# Patient Record
Sex: Male | Born: 1940
Health system: Southern US, Community
[De-identification: ages and names within clinical notes are randomized; demographics above are authoritative.]

## PROBLEM LIST (undated history)

## (undated) DIAGNOSIS — R410 Disorientation, unspecified: Secondary | ICD-10-CM

## (undated) DIAGNOSIS — N281 Cyst of kidney, acquired: Secondary | ICD-10-CM

## (undated) DIAGNOSIS — C801 Malignant (primary) neoplasm, unspecified: Secondary | ICD-10-CM

## (undated) DIAGNOSIS — I10 Essential (primary) hypertension: Secondary | ICD-10-CM

## (undated) DIAGNOSIS — J449 Chronic obstructive pulmonary disease, unspecified: Secondary | ICD-10-CM

## (undated) DIAGNOSIS — R972 Elevated prostate specific antigen [PSA]: Secondary | ICD-10-CM

## (undated) DIAGNOSIS — D329 Benign neoplasm of meninges, unspecified: Secondary | ICD-10-CM

## (undated) DIAGNOSIS — I4891 Unspecified atrial fibrillation: Secondary | ICD-10-CM

## (undated) DIAGNOSIS — E785 Hyperlipidemia, unspecified: Secondary | ICD-10-CM

## (undated) DIAGNOSIS — R0781 Pleurodynia: Secondary | ICD-10-CM

## (undated) DIAGNOSIS — N189 Chronic kidney disease, unspecified: Secondary | ICD-10-CM

## (undated) DIAGNOSIS — C649 Malignant neoplasm of unspecified kidney, except renal pelvis: Secondary | ICD-10-CM

## (undated) DIAGNOSIS — R001 Bradycardia, unspecified: Secondary | ICD-10-CM

## (undated) DIAGNOSIS — G9340 Encephalopathy, unspecified: Secondary | ICD-10-CM

## (undated) DIAGNOSIS — N400 Enlarged prostate without lower urinary tract symptoms: Secondary | ICD-10-CM

## (undated) DIAGNOSIS — N200 Calculus of kidney: Secondary | ICD-10-CM

## (undated) DIAGNOSIS — F0390 Unspecified dementia without behavioral disturbance: Secondary | ICD-10-CM

## (undated) DIAGNOSIS — N401 Enlarged prostate with lower urinary tract symptoms: Secondary | ICD-10-CM

## (undated) DIAGNOSIS — R259 Unspecified abnormal involuntary movements: Secondary | ICD-10-CM

## (undated) DIAGNOSIS — M5136 Other intervertebral disc degeneration, lumbar region: Secondary | ICD-10-CM

## (undated) HISTORY — DX: Elevated prostate specific antigen (PSA): R97.20

## (undated) HISTORY — DX: Benign neoplasm of meninges, unspecified: D32.9

## (undated) HISTORY — PX: CYSTOSCOPY WITH FULGERATION: SHX6638

## (undated) HISTORY — DX: Other intervertebral disc degeneration, lumbar region: M51.36

## (undated) HISTORY — DX: Unspecified abnormal involuntary movements: R25.9

## (undated) HISTORY — DX: Encephalopathy, unspecified: G93.40

## (undated) HISTORY — PX: URETEROSCOPY WITH HOLMIUM LASER LITHOTRIPSY: SHX6645

## (undated) HISTORY — DX: Unspecified atrial fibrillation: I48.91

## (undated) HISTORY — DX: Cyst of kidney, acquired: N28.1

## (undated) HISTORY — DX: Pleurodynia: R07.81

## (undated) HISTORY — DX: Calculus of kidney: N20.0

## (undated) HISTORY — DX: Benign prostatic hyperplasia with lower urinary tract symptoms: N40.1

## (undated) HISTORY — PX: NEPHRECTOMY: SHX65

## (undated) HISTORY — DX: Malignant neoplasm of unspecified kidney, except renal pelvis: C64.9

## (undated) HISTORY — DX: Essential (primary) hypertension: I10

## (undated) HISTORY — DX: Chronic obstructive pulmonary disease, unspecified: J44.9

## (undated) HISTORY — DX: Hyperlipidemia, unspecified: E78.5

## (undated) HISTORY — PX: COLON SURGERY: SHX602

## (undated) HISTORY — DX: Bradycardia, unspecified: R00.1

---

## 2004-05-31 ENCOUNTER — Ambulatory Visit: Payer: Self-pay | Admitting: Pain Medicine

## 2004-07-05 ENCOUNTER — Ambulatory Visit: Payer: Self-pay | Admitting: Pain Medicine

## 2004-07-26 ENCOUNTER — Ambulatory Visit: Payer: Self-pay | Admitting: Physician Assistant

## 2004-10-03 ENCOUNTER — Ambulatory Visit: Payer: Self-pay | Admitting: Physician Assistant

## 2004-12-26 ENCOUNTER — Ambulatory Visit: Payer: Self-pay | Admitting: Urology

## 2005-04-01 ENCOUNTER — Ambulatory Visit: Payer: Self-pay | Admitting: Physician Assistant

## 2005-07-30 ENCOUNTER — Ambulatory Visit: Payer: Self-pay | Admitting: Physician Assistant

## 2005-09-16 ENCOUNTER — Ambulatory Visit: Payer: Self-pay | Admitting: Pain Medicine

## 2005-10-14 ENCOUNTER — Ambulatory Visit: Payer: Self-pay | Admitting: Gastroenterology

## 2005-11-04 ENCOUNTER — Ambulatory Visit: Payer: Self-pay | Admitting: Physician Assistant

## 2005-11-21 ENCOUNTER — Ambulatory Visit: Payer: Self-pay | Admitting: Urology

## 2006-01-06 ENCOUNTER — Ambulatory Visit: Payer: Self-pay | Admitting: Urology

## 2006-03-04 ENCOUNTER — Ambulatory Visit: Payer: Self-pay | Admitting: Physician Assistant

## 2006-05-29 ENCOUNTER — Ambulatory Visit: Payer: Self-pay | Admitting: Physician Assistant

## 2006-06-25 ENCOUNTER — Ambulatory Visit: Payer: Self-pay | Admitting: Physician Assistant

## 2006-06-30 ENCOUNTER — Ambulatory Visit: Payer: Self-pay | Admitting: Urology

## 2006-08-05 ENCOUNTER — Ambulatory Visit: Payer: Self-pay | Admitting: Pain Medicine

## 2006-08-19 ENCOUNTER — Ambulatory Visit: Payer: Self-pay | Admitting: Physician Assistant

## 2006-10-01 ENCOUNTER — Ambulatory Visit: Payer: Self-pay | Admitting: Physician Assistant

## 2006-11-26 ENCOUNTER — Ambulatory Visit: Payer: Self-pay | Admitting: Physician Assistant

## 2007-02-24 ENCOUNTER — Ambulatory Visit: Payer: Self-pay | Admitting: Physician Assistant

## 2007-03-24 ENCOUNTER — Ambulatory Visit: Payer: Self-pay | Admitting: Physician Assistant

## 2007-03-31 ENCOUNTER — Ambulatory Visit: Payer: Self-pay | Admitting: Pain Medicine

## 2007-04-22 ENCOUNTER — Ambulatory Visit: Payer: Self-pay | Admitting: Physician Assistant

## 2007-07-13 ENCOUNTER — Emergency Department: Payer: Self-pay | Admitting: Emergency Medicine

## 2007-07-23 ENCOUNTER — Ambulatory Visit: Payer: Self-pay | Admitting: Physician Assistant

## 2007-08-05 ENCOUNTER — Ambulatory Visit: Payer: Self-pay

## 2007-08-24 ENCOUNTER — Ambulatory Visit: Payer: Self-pay | Admitting: Physician Assistant

## 2007-09-22 ENCOUNTER — Ambulatory Visit: Payer: Self-pay | Admitting: Physician Assistant

## 2007-11-19 ENCOUNTER — Ambulatory Visit: Payer: Self-pay | Admitting: Physician Assistant

## 2008-02-07 ENCOUNTER — Emergency Department: Payer: Self-pay | Admitting: Emergency Medicine

## 2008-02-08 ENCOUNTER — Ambulatory Visit: Payer: Self-pay | Admitting: Urology

## 2008-02-15 ENCOUNTER — Ambulatory Visit: Payer: Self-pay | Admitting: Urology

## 2008-02-17 ENCOUNTER — Other Ambulatory Visit: Payer: Self-pay

## 2008-02-17 ENCOUNTER — Ambulatory Visit: Payer: Self-pay | Admitting: Physician Assistant

## 2008-02-17 ENCOUNTER — Ambulatory Visit: Payer: Self-pay | Admitting: Urology

## 2008-02-18 ENCOUNTER — Ambulatory Visit: Payer: Self-pay | Admitting: Urology

## 2008-02-23 ENCOUNTER — Ambulatory Visit: Payer: Self-pay | Admitting: Physician Assistant

## 2008-03-01 ENCOUNTER — Ambulatory Visit: Payer: Self-pay | Admitting: Urology

## 2008-03-03 ENCOUNTER — Ambulatory Visit: Payer: Self-pay | Admitting: Urology

## 2008-03-09 ENCOUNTER — Ambulatory Visit: Payer: Self-pay | Admitting: Urology

## 2008-03-17 ENCOUNTER — Ambulatory Visit: Payer: Self-pay | Admitting: Urology

## 2008-03-30 ENCOUNTER — Ambulatory Visit: Payer: Self-pay | Admitting: Physician Assistant

## 2008-06-06 ENCOUNTER — Ambulatory Visit: Payer: Self-pay | Admitting: Physician Assistant

## 2008-06-16 ENCOUNTER — Ambulatory Visit: Payer: Self-pay | Admitting: Pain Medicine

## 2008-06-30 ENCOUNTER — Ambulatory Visit: Payer: Self-pay | Admitting: Physician Assistant

## 2008-08-03 ENCOUNTER — Ambulatory Visit: Payer: Self-pay | Admitting: Physician Assistant

## 2008-11-07 ENCOUNTER — Ambulatory Visit: Payer: Self-pay | Admitting: Pain Medicine

## 2008-11-15 ENCOUNTER — Ambulatory Visit: Payer: Self-pay | Admitting: Urology

## 2008-11-29 ENCOUNTER — Ambulatory Visit: Payer: Self-pay | Admitting: Urology

## 2008-11-30 ENCOUNTER — Inpatient Hospital Stay: Payer: Self-pay | Admitting: Urology

## 2008-12-13 ENCOUNTER — Emergency Department: Payer: Self-pay | Admitting: Urology

## 2008-12-21 ENCOUNTER — Ambulatory Visit: Payer: Self-pay | Admitting: Urology

## 2009-02-01 ENCOUNTER — Ambulatory Visit: Payer: Self-pay | Admitting: Physician Assistant

## 2009-05-02 ENCOUNTER — Ambulatory Visit: Payer: Self-pay | Admitting: Physician Assistant

## 2009-06-02 ENCOUNTER — Ambulatory Visit: Payer: Self-pay | Admitting: Urology

## 2009-08-01 ENCOUNTER — Ambulatory Visit: Payer: Self-pay | Admitting: Physician Assistant

## 2009-10-30 ENCOUNTER — Ambulatory Visit: Payer: Self-pay | Admitting: Pain Medicine

## 2009-12-26 ENCOUNTER — Ambulatory Visit: Payer: Self-pay | Admitting: Urology

## 2010-01-01 ENCOUNTER — Ambulatory Visit: Payer: Self-pay | Admitting: Urology

## 2010-01-10 ENCOUNTER — Ambulatory Visit: Payer: Self-pay | Admitting: Urology

## 2010-01-24 ENCOUNTER — Ambulatory Visit: Payer: Self-pay | Admitting: Urology

## 2010-04-17 ENCOUNTER — Ambulatory Visit: Payer: Self-pay | Admitting: Gastroenterology

## 2010-04-19 LAB — PATHOLOGY REPORT

## 2010-07-20 ENCOUNTER — Ambulatory Visit: Payer: Self-pay | Admitting: Urology

## 2010-07-31 ENCOUNTER — Ambulatory Visit: Payer: Self-pay | Admitting: Gastroenterology

## 2010-08-21 ENCOUNTER — Ambulatory Visit: Payer: Self-pay | Admitting: Surgery

## 2010-08-28 ENCOUNTER — Inpatient Hospital Stay: Payer: Self-pay | Admitting: Surgery

## 2010-08-30 LAB — PATHOLOGY REPORT

## 2011-02-12 ENCOUNTER — Ambulatory Visit: Payer: Self-pay | Admitting: Urology

## 2012-02-26 ENCOUNTER — Ambulatory Visit: Payer: Self-pay | Admitting: Urology

## 2012-10-06 ENCOUNTER — Ambulatory Visit: Payer: Self-pay | Admitting: Gastroenterology

## 2012-12-31 ENCOUNTER — Ambulatory Visit: Payer: Self-pay | Admitting: Urology

## 2013-01-01 ENCOUNTER — Emergency Department: Payer: Self-pay | Admitting: Emergency Medicine

## 2013-01-01 LAB — BASIC METABOLIC PANEL
Anion Gap: 4 — ABNORMAL LOW (ref 7–16)
Calcium, Total: 9 mg/dL (ref 8.5–10.1)
Chloride: 105 mmol/L (ref 98–107)
Co2: 28 mmol/L (ref 21–32)
Creatinine: 1.14 mg/dL (ref 0.60–1.30)
EGFR (Non-African Amer.): >60
Glucose: 107 mg/dL — ABNORMAL HIGH (ref 65–99)
Osmolality: 276 (ref 275–301)
Potassium: 3.9 mmol/L (ref 3.5–5.1)
Sodium: 137 mmol/L (ref 136–145)

## 2013-01-01 LAB — URINALYSIS, COMPLETE
Bilirubin,UR: NEGATIVE
Glucose,UR: NEGATIVE mg/dL (ref 0–75)
Ketone: NEGATIVE
Leukocyte Esterase: NEGATIVE
Nitrite: NEGATIVE
Ph: 7 (ref 4.5–8.0)
Protein: NEGATIVE
Squamous Epithelial: <1
WBC UR: 1 /HPF (ref 0–5)

## 2013-01-01 LAB — CBC
HCT: 43.4 % (ref 40.0–52.0)
HGB: 15 g/dL (ref 13.0–18.0)
MCH: 32 pg (ref 26.0–34.0)
MCHC: 34.6 g/dL (ref 32.0–36.0)
MCV: 92 fL (ref 80–100)
Platelet: 163 10*3/uL (ref 150–440)
RDW: 14 % (ref 11.5–14.5)
WBC: 11.1 10*3/uL — ABNORMAL HIGH (ref 3.8–10.6)

## 2013-01-04 ENCOUNTER — Ambulatory Visit: Payer: Self-pay | Admitting: Urology

## 2013-01-05 ENCOUNTER — Ambulatory Visit: Payer: Self-pay | Admitting: Urology

## 2013-01-06 ENCOUNTER — Ambulatory Visit: Payer: Self-pay | Admitting: Urology

## 2013-03-03 DIAGNOSIS — R339 Retention of urine, unspecified: Secondary | ICD-10-CM | POA: Insufficient documentation

## 2013-03-03 DIAGNOSIS — R351 Nocturia: Secondary | ICD-10-CM | POA: Insufficient documentation

## 2013-03-03 DIAGNOSIS — N401 Enlarged prostate with lower urinary tract symptoms: Secondary | ICD-10-CM

## 2013-03-03 DIAGNOSIS — R972 Elevated prostate specific antigen [PSA]: Secondary | ICD-10-CM

## 2013-03-03 HISTORY — DX: Benign prostatic hyperplasia with lower urinary tract symptoms: N40.1

## 2013-03-03 HISTORY — DX: Elevated prostate specific antigen (PSA): R97.20

## 2013-06-30 ENCOUNTER — Ambulatory Visit: Payer: Self-pay | Admitting: Ophthalmology

## 2013-10-20 ENCOUNTER — Ambulatory Visit: Payer: Self-pay | Admitting: Physical Medicine and Rehabilitation

## 2013-10-26 ENCOUNTER — Ambulatory Visit: Payer: Self-pay | Admitting: Oncology

## 2013-10-26 LAB — CBC CANCER CENTER
Basophil #: 0 x10 3/mm (ref 0.0–0.1)
Basophil %: 0.7 %
EOS ABS: 0.4 x10 3/mm (ref 0.0–0.7)
Eosinophil %: 5.7 %
HCT: 43.5 % (ref 40.0–52.0)
HGB: 14.3 g/dL (ref 13.0–18.0)
LYMPHS ABS: 1.6 x10 3/mm (ref 1.0–3.6)
Lymphocyte %: 24.9 %
MCH: 30.6 pg (ref 26.0–34.0)
MCHC: 32.8 g/dL (ref 32.0–36.0)
MCV: 93 fL (ref 80–100)
Monocyte #: 0.5 x10 3/mm (ref 0.2–1.0)
Monocyte %: 7.7 %
NEUTROS PCT: 61 %
Neutrophil #: 3.9 x10 3/mm (ref 1.4–6.5)
PLATELETS: 202 x10 3/mm (ref 150–440)
RBC: 4.67 10*6/uL (ref 4.40–5.90)
RDW: 13.9 % (ref 11.5–14.5)
WBC: 6.5 x10 3/mm (ref 3.8–10.6)

## 2013-10-26 LAB — COMPREHENSIVE METABOLIC PANEL
ALBUMIN: 4 g/dL (ref 3.4–5.0)
ANION GAP: 8 (ref 7–16)
Alkaline Phosphatase: 79 U/L
BUN: 13 mg/dL (ref 7–18)
Bilirubin,Total: 0.7 mg/dL (ref 0.2–1.0)
CHLORIDE: 103 mmol/L (ref 98–107)
Calcium, Total: 9.4 mg/dL (ref 8.5–10.1)
Co2: 30 mmol/L (ref 21–32)
Creatinine: 0.9 mg/dL (ref 0.60–1.30)
EGFR (Non-African Amer.): >60
Glucose: 137 mg/dL — ABNORMAL HIGH (ref 65–99)
OSMOLALITY: 284 (ref 275–301)
Potassium: 3.7 mmol/L (ref 3.5–5.1)
SGOT(AST): 8 U/L — ABNORMAL LOW (ref 15–37)
SGPT (ALT): 16 U/L (ref 12–78)
Sodium: 141 mmol/L (ref 136–145)
Total Protein: 7.3 g/dL (ref 6.4–8.2)

## 2013-10-27 LAB — PSA: PSA: 2 ng/mL (ref 0.0–4.0)

## 2013-11-12 ENCOUNTER — Ambulatory Visit: Payer: Self-pay | Admitting: Oncology

## 2013-11-17 DIAGNOSIS — N2889 Other specified disorders of kidney and ureter: Secondary | ICD-10-CM | POA: Insufficient documentation

## 2013-12-03 ENCOUNTER — Ambulatory Visit: Payer: Self-pay | Admitting: Gastroenterology

## 2013-12-06 DIAGNOSIS — N200 Calculus of kidney: Secondary | ICD-10-CM

## 2013-12-06 HISTORY — DX: Calculus of kidney: N20.0

## 2013-12-11 DIAGNOSIS — I4891 Unspecified atrial fibrillation: Secondary | ICD-10-CM

## 2013-12-11 DIAGNOSIS — R001 Bradycardia, unspecified: Secondary | ICD-10-CM

## 2013-12-11 DIAGNOSIS — J449 Chronic obstructive pulmonary disease, unspecified: Secondary | ICD-10-CM

## 2013-12-11 HISTORY — DX: Unspecified atrial fibrillation: I48.91

## 2013-12-11 HISTORY — DX: Chronic obstructive pulmonary disease, unspecified: J44.9

## 2013-12-11 HISTORY — DX: Bradycardia, unspecified: R00.1

## 2013-12-13 DIAGNOSIS — M5136 Other intervertebral disc degeneration, lumbar region: Secondary | ICD-10-CM

## 2013-12-13 DIAGNOSIS — M51369 Other intervertebral disc degeneration, lumbar region without mention of lumbar back pain or lower extremity pain: Secondary | ICD-10-CM

## 2013-12-13 HISTORY — DX: Other intervertebral disc degeneration, lumbar region without mention of lumbar back pain or lower extremity pain: M51.369

## 2013-12-13 HISTORY — DX: Other intervertebral disc degeneration, lumbar region: M51.36

## 2013-12-22 DIAGNOSIS — Z905 Acquired absence of kidney: Secondary | ICD-10-CM | POA: Insufficient documentation

## 2014-01-11 DIAGNOSIS — Z8719 Personal history of other diseases of the digestive system: Secondary | ICD-10-CM | POA: Insufficient documentation

## 2014-05-31 DIAGNOSIS — R0781 Pleurodynia: Secondary | ICD-10-CM

## 2014-05-31 HISTORY — DX: Pleurodynia: R07.81

## 2014-06-14 ENCOUNTER — Ambulatory Visit: Payer: Self-pay | Admitting: Ophthalmology

## 2014-06-14 DIAGNOSIS — I10 Essential (primary) hypertension: Secondary | ICD-10-CM

## 2014-06-14 DIAGNOSIS — Z01810 Encounter for preprocedural cardiovascular examination: Secondary | ICD-10-CM

## 2014-06-21 ENCOUNTER — Ambulatory Visit: Payer: Self-pay | Admitting: Ophthalmology

## 2014-11-04 NOTE — Op Note (Signed)
PATIENT NAME:  Russell Terry, Russell Terry MR#:  [id number] DATE OF BIRTH:  09-16-1940  DATE OF PROCEDURE:  01/06/2013  PREOPERATIVE DIAGNOSIS:  Left distal ureteral calculus.   POSTOPERATIVE DIAGNOSIS:  Left distal ureteral calculus.   PROCEDURES:   1.  Left ureteroscopy with holmium laser lithotripsy/stone extraction.  2.  Placement of left ureteral stent.   SURGEON:  Scott C. Bernardo Heater, MD   ASSISTANT:  None.   ANESTHESIA:  General.   INDICATIONS:  This is a 74 year old male with a history of recurrent stone disease. He developed left renal colic last week and a KUB was remarkable for an approximately 4 mm left proximal ureteral stone. He was seen in the office earlier this week and the stone had progressed to the distal ureter. He is having moderate to severe pain and after discussion of treatment options, he has requested ureteroscopic removal.   DESCRIPTION OF PROCEDURE:  The patient was taken down to the operating room and placed on the table in the supine position where a general anesthetic was administered via LMA. He was then placed in the low lithotomy position and his external genitalia were prepped and draped in the usual fashion. Timeout was performed per protocol with all in agreement. The urethral meatus was tied to a 21 French cystoscope and the meatus was dilated with [employer] sounds to 67 [place]. The 21 French cystoscope was easily passed. The urethra was normal in caliber without stricture. The prostate was remarkable for touching lateral lobes and a moderate median lobe. There was hypervascularity present. Bladder mucosa was closely inspected. There was moderate trabeculation present. There was no erythema, solid or papillary lesions. The right ureteral orifice was normal appearing with clear efflux. No efflux was seen from the left ureteral orifice. A 0.035 guidewire was placed through the cystoscope into the left ureteral orifice and could not be negotiated passed the stone. A 6  French open-ended catheter was placed through the cystoscope. The wire was then placed through the open-ended catheter and was able to be negotiated passed the stone. The cystoscope was removed and a 6 French semirigid ureteroscope was lubricated and passed per urethra. The left ureteral orifice was engaged without dilation. There was moderate edema of the distal ureter just proximal to this stone and the stone was identified. A 365 micron holmium laser fiber was placed through the ureteroscope and stone was fragmented; however, the power had to be increased to 8 watts. The stent was fragmented into 3 to 4 fragments which were removed from the ureter and dropped in the bladder with tricep forceps. The ureteroscope was then repassed up to the UPJ and no other stone fragments were seen. The left retrograde pyelogram showed no extravasation. The ureteroscope was removed. A 6 French/24 cm Microvasive Contour ureteral stent was placed over the wire. There was good curl seen in the renal pelvis on fluoroscopy. The distal end of the stent was well positioned in the bladder and repassaged with the cystoscope. The bladder was emptied. The stent was left attached to a pull string and will be removed at a later date. B and O suppository was placed per rectum. He was taken to PACU in stable condition. There were no complications. EBL was minimal.    ____________________________ Ronda Fairly. Bernardo Heater, MD scs:si D: 01/06/2013 16:19:33 ET T: 01/06/2013 21:59:32 ET JOB#: [id number]  cc: Nicki Reaper C. Bernardo Heater, MD, <Dictator> Abbie Sons MD ELECTRONICALLY SIGNED 02/01/2013 10:05

## 2014-11-05 NOTE — Op Note (Signed)
PATIENT NAME:  Russell Terry, Russell Terry MR#:  [id number] DATE OF BIRTH:  1940/08/14  DATE OF PROCEDURE:  06/21/2014  PREOPERATIVE DIAGNOSIS:  Nuclear sclerotic cataract of the left eye.   POSTOPERATIVE DIAGNOSIS:  Nuclear sclerotic cataract of the left eye.   OPERATIVE PROCEDURE:  Cataract extraction by phacoemulsification with implant of intraocular lens to left eye.   SURGEON:  Livingston Diones. Valene Villa, MD  ANESTHESIA:  1. Managed anesthesia care.  2. Topical tetracaine drops followed by 2% Xylocaine jelly applied in the preoperative holding area.   COMPLICATIONS:  None.   TECHNIQUE:  Stop and chop.   DESCRIPTION OF PROCEDURE:  The patient was examined and consented in the preoperative holding area where the aforementioned topical anesthesia was applied to the left eye and then brought back to the Operating Room where the left eye was prepped and draped in the usual sterile ophthalmic fashion and a lid speculum was placed. A paracentesis was created with the side port blade and the anterior chamber was filled with viscoelastic. A near clear corneal incision was performed with the steel keratome. A continuous curvilinear capsulorrhexis was performed with a cystotome followed by the capsulorrhexis forceps. Hydrodissection and hydrodelineation were carried out with BSS on a blunt cannula. The lens was removed in a stop and chop technique and the remaining cortical material was removed with the irrigation-aspiration handpiece. The capsular bag was inflated with viscoelastic and the Tecnis ZCB00 20.5-diopter lens, serial number [id number], was placed in the capsular bag without complication. The remaining viscoelastic was removed from the eye with the irrigation-aspiration handpiece. The wounds were hydrated. The anterior chamber was flushed with Miostat and the eye was inflated to physiologic pressure. 0.1 mL of cefuroxime concentration 10 mg/mL was placed in the anterior chamber. The wounds were found to be  water tight. The eye was dressed with Vigamox. The patient was given protective glasses to wear throughout the day and a shield with which to sleep tonight. The patient was also given drops with which to begin a drop regimen today and will follow up with me in one day.     ____________________________ Livingston Diones. Kathalina Ostermann, MD wlp:nb D: 06/21/2014 21:24:45 ET T: 06/22/2014 00:05:51 ET JOB#: [id number]  cc: Weylyn Ricciuti L. Mathias Bogacki, MD, <Dictator> Livingston Diones Brownie Nehme MD ELECTRONICALLY SIGNED 06/23/2014 10:37

## 2015-04-22 DIAGNOSIS — N281 Cyst of kidney, acquired: Secondary | ICD-10-CM

## 2015-04-22 HISTORY — DX: Cyst of kidney, acquired: N28.1

## 2015-07-16 HISTORY — PX: BRAIN SURGERY: SHX531

## 2015-08-23 ENCOUNTER — Other Ambulatory Visit: Payer: Self-pay | Admitting: Physical Medicine and Rehabilitation

## 2015-08-23 DIAGNOSIS — M5416 Radiculopathy, lumbar region: Secondary | ICD-10-CM

## 2015-09-11 ENCOUNTER — Ambulatory Visit
Admission: RE | Admit: 2015-09-11 | Discharge: 2015-09-11 | Disposition: A | Discharge status: Home / Self Care | Payer: PPO | Source: Ambulatory Visit | Attending: Physical Medicine and Rehabilitation | Admitting: Physical Medicine and Rehabilitation

## 2015-09-11 DIAGNOSIS — M2578 Osteophyte, vertebrae: Secondary | ICD-10-CM | POA: Diagnosis not present

## 2015-09-11 DIAGNOSIS — M5136 Other intervertebral disc degeneration, lumbar region: Secondary | ICD-10-CM | POA: Diagnosis not present

## 2015-09-11 DIAGNOSIS — M4806 Spinal stenosis, lumbar region: Secondary | ICD-10-CM | POA: Diagnosis not present

## 2015-09-11 DIAGNOSIS — M5416 Radiculopathy, lumbar region: Secondary | ICD-10-CM | POA: Insufficient documentation

## 2015-09-11 DIAGNOSIS — M5126 Other intervertebral disc displacement, lumbar region: Secondary | ICD-10-CM | POA: Diagnosis not present

## 2015-09-11 DIAGNOSIS — M4690 Unspecified inflammatory spondylopathy, site unspecified: Secondary | ICD-10-CM | POA: Diagnosis not present

## 2015-09-12 DIAGNOSIS — M5126 Other intervertebral disc displacement, lumbar region: Secondary | ICD-10-CM | POA: Diagnosis not present

## 2015-09-12 DIAGNOSIS — M545 Low back pain: Secondary | ICD-10-CM | POA: Diagnosis not present

## 2015-09-12 DIAGNOSIS — M4806 Spinal stenosis, lumbar region: Secondary | ICD-10-CM | POA: Diagnosis not present

## 2015-09-12 DIAGNOSIS — M5416 Radiculopathy, lumbar region: Secondary | ICD-10-CM | POA: Diagnosis not present

## 2015-09-13 ENCOUNTER — Other Ambulatory Visit: Payer: Self-pay | Admitting: Neurosurgery

## 2015-09-21 ENCOUNTER — Inpatient Hospital Stay (HOSPITAL_COMMUNITY)
Admission: RE | Admit: 2015-09-21 | Discharge: 2015-09-21 | Disposition: A | Discharge status: Home / Self Care | Payer: PPO | Source: Ambulatory Visit

## 2015-09-21 NOTE — Pre-Procedure Instructions (Signed)
    Russell Terry  09/21/2015     No Pharmacies Listed   Your procedure is scheduled on 10/18/15.  Report to [facility] Admitting at 630 A.M.  Call this number if you have problems the morning of surgery:  [phone]   Remember:  Do not eat food or drink liquids after midnight.  Take these medicines the morning of surgery with A SIP OF WATER --   Do not wear jewelry, make-up or nail polish.  Do not wear lotions, powders, or perfumes.  You may wear deodorant.  Do not shave 48 hours prior to surgery.  Men may shave face and neck.  Do not bring valuables to the hospital.  [facility] is not responsible for any belongings or valuables.  Contacts, dentures or bridgework may not be worn into surgery.  Leave your suitcase in the car.  After surgery it may be brought to your room.  For patients admitted to the hospital, discharge time will be determined by your treatment team.  Patients discharged the day of surgery will not be allowed to drive home.   Name and phone number of your driver:    Special instructions:   Please read over the following fact sheets that you were given. Pain Booklet, Coughing and Deep Breathing, MRSA Information and Surgical Site Infection Prevention

## 2015-10-02 DIAGNOSIS — E785 Hyperlipidemia, unspecified: Secondary | ICD-10-CM | POA: Diagnosis not present

## 2015-10-02 DIAGNOSIS — I10 Essential (primary) hypertension: Secondary | ICD-10-CM | POA: Diagnosis not present

## 2015-10-02 DIAGNOSIS — R21 Rash and other nonspecific skin eruption: Secondary | ICD-10-CM | POA: Diagnosis not present

## 2015-10-02 DIAGNOSIS — J449 Chronic obstructive pulmonary disease, unspecified: Secondary | ICD-10-CM | POA: Diagnosis not present

## 2015-10-09 ENCOUNTER — Encounter (HOSPITAL_COMMUNITY)
Admission: RE | Admit: 2015-10-09 | Discharge: 2015-10-09 | Disposition: A | Discharge status: Home / Self Care | Payer: PPO | Source: Ambulatory Visit | Attending: Neurosurgery | Admitting: Neurosurgery

## 2015-10-09 ENCOUNTER — Encounter (HOSPITAL_COMMUNITY): Payer: Self-pay

## 2015-10-09 DIAGNOSIS — I10 Essential (primary) hypertension: Secondary | ICD-10-CM | POA: Insufficient documentation

## 2015-10-09 DIAGNOSIS — R9431 Abnormal electrocardiogram [ECG] [EKG]: Secondary | ICD-10-CM | POA: Insufficient documentation

## 2015-10-09 DIAGNOSIS — Z01812 Encounter for preprocedural laboratory examination: Secondary | ICD-10-CM | POA: Insufficient documentation

## 2015-10-09 DIAGNOSIS — Z01818 Encounter for other preprocedural examination: Secondary | ICD-10-CM | POA: Diagnosis not present

## 2015-10-09 DIAGNOSIS — M4806 Spinal stenosis, lumbar region: Secondary | ICD-10-CM | POA: Insufficient documentation

## 2015-10-09 HISTORY — DX: Essential (primary) hypertension: I10

## 2015-10-09 HISTORY — DX: Chronic kidney disease, unspecified: N18.9

## 2015-10-09 HISTORY — DX: Malignant (primary) neoplasm, unspecified: C80.1

## 2015-10-09 HISTORY — DX: Benign prostatic hyperplasia without lower urinary tract symptoms: N40.0

## 2015-10-09 LAB — BASIC METABOLIC PANEL
ANION GAP: 9 (ref 5–15)
BUN: 14 mg/dL (ref 6–20)
CHLORIDE: 108 mmol/L (ref 101–111)
CO2: 23 mmol/L (ref 22–32)
Calcium: 9.2 mg/dL (ref 8.9–10.3)
Creatinine, Ser: 1.33 mg/dL — ABNORMAL HIGH (ref 0.61–1.24)
GFR calc Af Amer: 59 mL/min — ABNORMAL LOW (ref >60)
GFR calc non Af Amer: 51 mL/min — ABNORMAL LOW (ref >60)
Glucose, Bld: 105 mg/dL — ABNORMAL HIGH (ref 65–99)
POTASSIUM: 4 mmol/L (ref 3.5–5.1)
SODIUM: 140 mmol/L (ref 135–145)

## 2015-10-09 LAB — CBC
HEMATOCRIT: 44.3 % (ref 39.0–52.0)
HEMOGLOBIN: 15 g/dL (ref 13.0–17.0)
MCH: 31.3 pg (ref 26.0–34.0)
MCHC: 33.9 g/dL (ref 30.0–36.0)
MCV: 92.3 fL (ref 78.0–100.0)
Platelets: 156 10*3/uL (ref 150–400)
RBC: 4.8 MIL/uL (ref 4.22–5.81)
RDW: 14.1 % (ref 11.5–15.5)
WBC: 6.3 10*3/uL (ref 4.0–10.5)

## 2015-10-09 LAB — SURGICAL PCR SCREEN
MRSA, PCR: NEGATIVE
Staphylococcus aureus: NEGATIVE

## 2015-10-10 DIAGNOSIS — I10 Essential (primary) hypertension: Secondary | ICD-10-CM | POA: Diagnosis not present

## 2015-10-10 DIAGNOSIS — E785 Hyperlipidemia, unspecified: Secondary | ICD-10-CM | POA: Diagnosis not present

## 2015-10-17 MED ORDER — CEFAZOLIN SODIUM-DEXTROSE 2-4 GM/100ML-% IV SOLN
2.0000 g | INTRAVENOUS | Status: AC
  Administered 2015-10-18: 2 g via INTRAVENOUS
  Filled 2015-10-17: qty 100

## 2015-10-18 ENCOUNTER — Ambulatory Visit (HOSPITAL_COMMUNITY): Payer: PPO

## 2015-10-18 ENCOUNTER — Ambulatory Visit (HOSPITAL_COMMUNITY)
Admission: RE | Admit: 2015-10-18 | Discharge: 2015-10-21 | Disposition: A | Discharge status: Home / Self Care | Payer: PPO | Source: Ambulatory Visit | Attending: Neurosurgery | Admitting: Neurosurgery

## 2015-10-18 ENCOUNTER — Ambulatory Visit (HOSPITAL_COMMUNITY): Payer: PPO | Admitting: Certified Registered Nurse Anesthetist

## 2015-10-18 ENCOUNTER — Encounter (HOSPITAL_COMMUNITY): Payer: Self-pay | Admitting: Certified Registered Nurse Anesthetist

## 2015-10-18 ENCOUNTER — Encounter (HOSPITAL_COMMUNITY)
Admission: RE | Disposition: A | Discharge status: Home / Self Care | Payer: Self-pay | Source: Ambulatory Visit | Attending: Neurosurgery

## 2015-10-18 DIAGNOSIS — N189 Chronic kidney disease, unspecified: Secondary | ICD-10-CM | POA: Diagnosis not present

## 2015-10-18 DIAGNOSIS — I129 Hypertensive chronic kidney disease with stage 1 through stage 4 chronic kidney disease, or unspecified chronic kidney disease: Secondary | ICD-10-CM | POA: Insufficient documentation

## 2015-10-18 DIAGNOSIS — R338 Other retention of urine: Secondary | ICD-10-CM | POA: Diagnosis not present

## 2015-10-18 DIAGNOSIS — Z905 Acquired absence of kidney: Secondary | ICD-10-CM | POA: Diagnosis not present

## 2015-10-18 DIAGNOSIS — Z7982 Long term (current) use of aspirin: Secondary | ICD-10-CM | POA: Diagnosis not present

## 2015-10-18 DIAGNOSIS — M5416 Radiculopathy, lumbar region: Secondary | ICD-10-CM | POA: Diagnosis not present

## 2015-10-18 DIAGNOSIS — F17210 Nicotine dependence, cigarettes, uncomplicated: Secondary | ICD-10-CM | POA: Diagnosis not present

## 2015-10-18 DIAGNOSIS — M5136 Other intervertebral disc degeneration, lumbar region: Secondary | ICD-10-CM | POA: Diagnosis not present

## 2015-10-18 DIAGNOSIS — Z79899 Other long term (current) drug therapy: Secondary | ICD-10-CM | POA: Insufficient documentation

## 2015-10-18 DIAGNOSIS — N401 Enlarged prostate with lower urinary tract symptoms: Secondary | ICD-10-CM | POA: Diagnosis not present

## 2015-10-18 DIAGNOSIS — M549 Dorsalgia, unspecified: Secondary | ICD-10-CM

## 2015-10-18 DIAGNOSIS — M48062 Spinal stenosis, lumbar region with neurogenic claudication: Secondary | ICD-10-CM | POA: Diagnosis present

## 2015-10-18 DIAGNOSIS — M4806 Spinal stenosis, lumbar region: Secondary | ICD-10-CM | POA: Insufficient documentation

## 2015-10-18 HISTORY — PX: LUMBAR LAMINECTOMY/DECOMPRESSION MICRODISCECTOMY: SHX5026

## 2015-10-18 SURGERY — LUMBAR LAMINECTOMY/DECOMPRESSION MICRODISCECTOMY 3 LEVELS
Anesthesia: General | Site: Back

## 2015-10-18 MED ORDER — SUCCINYLCHOLINE CHLORIDE 20 MG/ML IJ SOLN
INTRAMUSCULAR | Status: AC
  Filled 2015-10-18: qty 1

## 2015-10-18 MED ORDER — ACETAMINOPHEN 650 MG RE SUPP: 650.0000 mg | RECTAL | Status: DC | PRN

## 2015-10-18 MED ORDER — FENTANYL CITRATE (PF) 100 MCG/2ML IJ SOLN
INTRAMUSCULAR | Status: DC | PRN
  Administered 2015-10-18 (×3): 50 ug via INTRAVENOUS

## 2015-10-18 MED ORDER — DILTIAZEM HCL ER COATED BEADS 240 MG PO CP24
240.0000 mg | ORAL_CAPSULE | Freq: Every day | ORAL | Status: DC
  Administered 2015-10-19 – 2015-10-21 (×3): 240 mg via ORAL
  Filled 2015-10-18: qty 1
  Filled 2015-10-18: qty 2
  Filled 2015-10-18: qty 1
  Filled 2015-10-18: qty 2
  Filled 2015-10-18: qty 1

## 2015-10-18 MED ORDER — LACTATED RINGERS IV SOLN
INTRAVENOUS | Status: DC
  Administered 2015-10-18: 08:00:00 via INTRAVENOUS

## 2015-10-18 MED ORDER — MENTHOL 3 MG MT LOZG: 1.0000 | LOZENGE | OROMUCOSAL | Status: DC | PRN

## 2015-10-18 MED ORDER — GABAPENTIN 300 MG PO CAPS
600.0000 mg | ORAL_CAPSULE | Freq: Every day | ORAL | Status: DC
Start: 2015-10-18 — End: 2015-10-21
  Administered 2015-10-18 – 2015-10-20 (×2): 600 mg via ORAL
  Filled 2015-10-18 (×3): qty 2

## 2015-10-18 MED ORDER — OXYCODONE-ACETAMINOPHEN 5-325 MG PO TABS
1.0000 | ORAL_TABLET | ORAL | Status: DC | PRN
  Administered 2015-10-18 – 2015-10-21 (×10): 2 via ORAL
  Filled 2015-10-18: qty 2
  Filled 2015-10-18: qty 1
  Filled 2015-10-18 (×4): qty 2
  Filled 2015-10-18: qty 1
  Filled 2015-10-18 (×4): qty 2

## 2015-10-18 MED ORDER — HYDROMORPHONE HCL 1 MG/ML IJ SOLN
0.2500 mg | INTRAMUSCULAR | Status: DC | PRN
  Administered 2015-10-18 (×2): 0.5 mg via INTRAVENOUS

## 2015-10-18 MED ORDER — GABAPENTIN 300 MG PO CAPS
300.0000 mg | ORAL_CAPSULE | Freq: Two times a day (BID) | ORAL | Status: DC
  Administered 2015-10-19 – 2015-10-21 (×5): 300 mg via ORAL
  Filled 2015-10-18 (×5): qty 1

## 2015-10-18 MED ORDER — DOXAZOSIN MESYLATE 4 MG PO TABS
4.0000 mg | ORAL_TABLET | Freq: Every day | ORAL | Status: DC
  Administered 2015-10-19 – 2015-10-21 (×3): 4 mg via ORAL
  Filled 2015-10-18 (×3): qty 1

## 2015-10-18 MED ORDER — HYDROMORPHONE HCL 1 MG/ML IJ SOLN
INTRAMUSCULAR | Status: AC
  Filled 2015-10-18: qty 1

## 2015-10-18 MED ORDER — PROPOFOL 10 MG/ML IV BOLUS
INTRAVENOUS | Status: AC
  Filled 2015-10-18: qty 20

## 2015-10-18 MED ORDER — BISACODYL 10 MG RE SUPP: 10.0000 mg | Freq: Every day | RECTAL | Status: DC | PRN

## 2015-10-18 MED ORDER — ONDANSETRON HCL 4 MG/2ML IJ SOLN
INTRAMUSCULAR | Status: AC
  Filled 2015-10-18: qty 2

## 2015-10-18 MED ORDER — BUPIVACAINE-EPINEPHRINE (PF) 0.5% -1:200000 IJ SOLN
INTRAMUSCULAR | Status: DC | PRN
  Administered 2015-10-18: 10 mL via PERINEURAL

## 2015-10-18 MED ORDER — POLYVINYL ALCOHOL 1.4 % OP SOLN
1.0000 [drp] | OPHTHALMIC | Status: DC | PRN
  Filled 2015-10-18: qty 15

## 2015-10-18 MED ORDER — SALINE SPRAY 0.65 % NA SOLN: 1.0000 | NASAL | Status: DC | PRN

## 2015-10-18 MED ORDER — LIDOCAINE HCL (CARDIAC) 20 MG/ML IV SOLN
INTRAVENOUS | Status: DC | PRN
  Administered 2015-10-18: 60 mg via INTRAVENOUS

## 2015-10-18 MED ORDER — HEMOSTATIC AGENTS (NO CHARGE) OPTIME
TOPICAL | Status: DC | PRN
  Administered 2015-10-18: 1 via TOPICAL

## 2015-10-18 MED ORDER — SODIUM CHLORIDE 0.9 % IR SOLN
Status: DC | PRN
  Administered 2015-10-18: 500 mL

## 2015-10-18 MED ORDER — ARTIFICIAL TEARS OP OINT
TOPICAL_OINTMENT | OPHTHALMIC | Status: AC
  Filled 2015-10-18: qty 3.5

## 2015-10-18 MED ORDER — LIDOCAINE HCL (CARDIAC) 20 MG/ML IV SOLN
INTRAVENOUS | Status: AC
  Filled 2015-10-18: qty 5

## 2015-10-18 MED ORDER — ALUM & MAG HYDROXIDE-SIMETH 200-200-20 MG/5ML PO SUSP: 30.0000 mL | Freq: Four times a day (QID) | ORAL | Status: DC | PRN

## 2015-10-18 MED ORDER — DUTASTERIDE 0.5 MG PO CAPS
0.5000 mg | ORAL_CAPSULE | Freq: Every day | ORAL | Status: DC
  Administered 2015-10-18 – 2015-10-21 (×4): 0.5 mg via ORAL
  Filled 2015-10-18 (×4): qty 1

## 2015-10-18 MED ORDER — ARTIFICIAL TEARS OP OINT
TOPICAL_OINTMENT | OPHTHALMIC | Status: DC | PRN
Start: 2015-10-18 — End: 2015-10-18
  Administered 2015-10-18: 1 via OPHTHALMIC

## 2015-10-18 MED ORDER — PROPOFOL 10 MG/ML IV BOLUS
INTRAVENOUS | Status: DC | PRN
  Administered 2015-10-18: 170 mg via INTRAVENOUS

## 2015-10-18 MED ORDER — ROCURONIUM BROMIDE 50 MG/5ML IV SOLN
INTRAVENOUS | Status: AC
  Filled 2015-10-18: qty 1

## 2015-10-18 MED ORDER — ROCURONIUM BROMIDE 100 MG/10ML IV SOLN
INTRAVENOUS | Status: DC | PRN
  Administered 2015-10-18: 50 mg via INTRAVENOUS

## 2015-10-18 MED ORDER — ONDANSETRON HCL 4 MG/2ML IJ SOLN: 4.0000 mg | Freq: Four times a day (QID) | INTRAMUSCULAR | Status: DC | PRN

## 2015-10-18 MED ORDER — EPHEDRINE SULFATE 50 MG/ML IJ SOLN
INTRAMUSCULAR | Status: AC
  Filled 2015-10-18: qty 1

## 2015-10-18 MED ORDER — FENTANYL CITRATE (PF) 250 MCG/5ML IJ SOLN
INTRAMUSCULAR | Status: AC
  Filled 2015-10-18: qty 5

## 2015-10-18 MED ORDER — GLYCOPYRROLATE 0.2 MG/ML IJ SOLN
INTRAMUSCULAR | Status: AC
  Filled 2015-10-18: qty 1

## 2015-10-18 MED ORDER — ACETAMINOPHEN 325 MG PO TABS: 650.0000 mg | ORAL_TABLET | ORAL | Status: DC | PRN

## 2015-10-18 MED ORDER — HYDROMORPHONE HCL 1 MG/ML IJ SOLN
1.0000 mg | INTRAMUSCULAR | Status: DC | PRN
  Administered 2015-10-19: 1 mg via INTRAVENOUS
  Filled 2015-10-18: qty 1

## 2015-10-18 MED ORDER — LACTATED RINGERS IV SOLN
INTRAVENOUS | Status: DC | PRN
  Administered 2015-10-18 (×2): via INTRAVENOUS

## 2015-10-18 MED ORDER — PHENYLEPHRINE HCL 10 MG/ML IJ SOLN
10.0000 mg | INTRAVENOUS | Status: DC | PRN
  Administered 2015-10-18: 20 ug/min via INTRAVENOUS

## 2015-10-18 MED ORDER — 0.9 % SODIUM CHLORIDE (POUR BTL) OPTIME
TOPICAL | Status: DC | PRN
  Administered 2015-10-18: 1000 mL

## 2015-10-18 MED ORDER — THROMBIN 5000 UNITS EX SOLR
CUTANEOUS | Status: DC | PRN
  Administered 2015-10-18 (×2): 5000 [IU] via TOPICAL

## 2015-10-18 MED ORDER — HYDROCODONE-ACETAMINOPHEN 5-325 MG PO TABS: 1.0000 | ORAL_TABLET | ORAL | Status: DC | PRN

## 2015-10-18 MED ORDER — PANTOPRAZOLE SODIUM 40 MG PO TBEC
40.0000 mg | DELAYED_RELEASE_TABLET | Freq: Every day | ORAL | Status: DC
  Administered 2015-10-18 – 2015-10-21 (×4): 40 mg via ORAL
  Filled 2015-10-18 (×4): qty 1

## 2015-10-18 MED ORDER — GLYCOPYRROLATE 0.2 MG/ML IJ SOLN
INTRAMUSCULAR | Status: AC
  Filled 2015-10-18: qty 2

## 2015-10-18 MED ORDER — EPHEDRINE SULFATE 50 MG/ML IJ SOLN
INTRAMUSCULAR | Status: DC | PRN
  Administered 2015-10-18: 10 mg via INTRAVENOUS
  Administered 2015-10-18: 5 mg via INTRAVENOUS
  Administered 2015-10-18: 10 mg via INTRAVENOUS
  Administered 2015-10-18 (×3): 5 mg via INTRAVENOUS

## 2015-10-18 MED ORDER — DIAZEPAM 5 MG PO TABS
5.0000 mg | ORAL_TABLET | Freq: Four times a day (QID) | ORAL | Status: DC | PRN
  Administered 2015-10-19: 5 mg via ORAL
  Filled 2015-10-18: qty 1

## 2015-10-18 MED ORDER — LACTATED RINGERS IV SOLN: INTRAVENOUS | Status: DC

## 2015-10-18 MED ORDER — OXYCODONE HCL 5 MG/5ML PO SOLN: 5.0000 mg | Freq: Once | ORAL | Status: DC | PRN

## 2015-10-18 MED ORDER — BACITRACIN ZINC 500 UNIT/GM EX OINT
TOPICAL_OINTMENT | CUTANEOUS | Status: DC | PRN
  Administered 2015-10-18: 1 via TOPICAL

## 2015-10-18 MED ORDER — CEFAZOLIN SODIUM-DEXTROSE 2-4 GM/100ML-% IV SOLN
2.0000 g | Freq: Three times a day (TID) | INTRAVENOUS | Status: AC
  Administered 2015-10-18 (×2): 2 g via INTRAVENOUS
  Filled 2015-10-18 (×2): qty 100

## 2015-10-18 MED ORDER — PHENOL 1.4 % MT LIQD: 1.0000 | OROMUCOSAL | Status: DC | PRN

## 2015-10-18 MED ORDER — ONDANSETRON HCL 4 MG/2ML IJ SOLN: 4.0000 mg | INTRAMUSCULAR | Status: DC | PRN

## 2015-10-18 MED ORDER — TRAZODONE HCL 150 MG PO TABS
150.0000 mg | ORAL_TABLET | Freq: Every day | ORAL | Status: DC
  Administered 2015-10-18 – 2015-10-20 (×2): 150 mg via ORAL
  Filled 2015-10-18 (×3): qty 1

## 2015-10-18 MED ORDER — OXYCODONE HCL 5 MG PO TABS: 5.0000 mg | ORAL_TABLET | Freq: Once | ORAL | Status: DC | PRN

## 2015-10-18 MED ORDER — DOCUSATE SODIUM 100 MG PO CAPS
100.0000 mg | ORAL_CAPSULE | Freq: Two times a day (BID) | ORAL | Status: DC
  Administered 2015-10-18 – 2015-10-21 (×5): 100 mg via ORAL
  Filled 2015-10-18 (×6): qty 1

## 2015-10-18 MED ORDER — THROMBIN 5000 UNITS EX SOLR
OROMUCOSAL | Status: DC | PRN
  Administered 2015-10-18: 10 mL via TOPICAL

## 2015-10-18 MED ORDER — NEOSTIGMINE METHYLSULFATE 10 MG/10ML IV SOLN
INTRAVENOUS | Status: DC | PRN
  Administered 2015-10-18: 3 mg via INTRAVENOUS

## 2015-10-18 MED ORDER — GLYCOPYRROLATE 0.2 MG/ML IJ SOLN
INTRAMUSCULAR | Status: DC | PRN
  Administered 2015-10-18: 0.2 mg via INTRAVENOUS
  Administered 2015-10-18: 0.4 mg via INTRAVENOUS

## 2015-10-18 MED ORDER — ONDANSETRON HCL 4 MG/2ML IJ SOLN
INTRAMUSCULAR | Status: DC | PRN
  Administered 2015-10-18: 4 mg via INTRAVENOUS

## 2015-10-18 SURGICAL SUPPLY — 47 items
BAG DECANTER FOR FLEXI CONT (MISCELLANEOUS) ×3 IMPLANT
BENZOIN TINCTURE PRP APPL 2/3 (GAUZE/BANDAGES/DRESSINGS) ×3 IMPLANT
BLADE CLIPPER SURG (BLADE) IMPLANT
BRUSH SCRUB EZ PLAIN DRY (MISCELLANEOUS) ×3 IMPLANT
BUR MATCHSTICK NEURO 3.0 LAGG (BURR) ×3 IMPLANT
BUR PRECISION FLUTE 6.0 (BURR) ×3 IMPLANT
CANISTER SUCT 3000ML PPV (MISCELLANEOUS) ×3 IMPLANT
CLOSURE WOUND 1/2 X4 (GAUZE/BANDAGES/DRESSINGS) ×1
DRAPE LAPAROTOMY 100X72X124 (DRAPES) ×3 IMPLANT
DRAPE MICROSCOPE LEICA (MISCELLANEOUS) ×3 IMPLANT
DRAPE POUCH INSTRU U-SHP 10X18 (DRAPES) ×3 IMPLANT
DRAPE SURG 17X23 STRL (DRAPES) ×12 IMPLANT
ELECT BLADE 4.0 EZ CLEAN MEGAD (MISCELLANEOUS) ×3
ELECT REM PT RETURN 9FT ADLT (ELECTROSURGICAL) ×3
ELECTRODE BLDE 4.0 EZ CLN MEGD (MISCELLANEOUS) ×1 IMPLANT
ELECTRODE REM PT RTRN 9FT ADLT (ELECTROSURGICAL) ×1 IMPLANT
GAUZE SPONGE 4X4 12PLY STRL (GAUZE/BANDAGES/DRESSINGS) ×3 IMPLANT
GAUZE SPONGE 4X4 16PLY XRAY LF (GAUZE/BANDAGES/DRESSINGS) IMPLANT
GLOVE BIO SURGEON STRL SZ8 (GLOVE) ×3 IMPLANT
GLOVE BIO SURGEON STRL SZ8.5 (GLOVE) ×3 IMPLANT
GLOVE EXAM NITRILE LRG STRL (GLOVE) IMPLANT
GLOVE EXAM NITRILE MD LF STRL (GLOVE) IMPLANT
GLOVE EXAM NITRILE XL STR (GLOVE) IMPLANT
GLOVE EXAM NITRILE XS STR PU (GLOVE) IMPLANT
GOWN STRL REUS W/ TWL LRG LVL3 (GOWN DISPOSABLE) IMPLANT
GOWN STRL REUS W/ TWL XL LVL3 (GOWN DISPOSABLE) ×1 IMPLANT
GOWN STRL REUS W/TWL 2XL LVL3 (GOWN DISPOSABLE) IMPLANT
GOWN STRL REUS W/TWL LRG LVL3 (GOWN DISPOSABLE)
GOWN STRL REUS W/TWL XL LVL3 (GOWN DISPOSABLE) ×2
KIT BASIN OR (CUSTOM PROCEDURE TRAY) ×3 IMPLANT
KIT ROOM TURNOVER OR (KITS) ×3 IMPLANT
NEEDLE HYPO 21X1.5 SAFETY (NEEDLE) IMPLANT
NEEDLE HYPO 22GX1.5 SAFETY (NEEDLE) ×3 IMPLANT
NS IRRIG 1000ML POUR BTL (IV SOLUTION) ×3 IMPLANT
PACK LAMINECTOMY NEURO (CUSTOM PROCEDURE TRAY) ×3 IMPLANT
PAD ARMBOARD 7.5X6 YLW CONV (MISCELLANEOUS) ×9 IMPLANT
PATTIES SURGICAL .5 X1 (DISPOSABLE) IMPLANT
RUBBERBAND STERILE (MISCELLANEOUS) ×6 IMPLANT
SPONGE SURGIFOAM ABS GEL SZ50 (HEMOSTASIS) ×3 IMPLANT
STRIP CLOSURE SKIN 1/2X4 (GAUZE/BANDAGES/DRESSINGS) ×2 IMPLANT
SUT VIC AB 1 CT1 18XBRD ANBCTR (SUTURE) ×2 IMPLANT
SUT VIC AB 1 CT1 8-18 (SUTURE) ×4
SUT VIC AB 2-0 CP2 18 (SUTURE) ×6 IMPLANT
TAPE STRIPS DRAPE STRL (GAUZE/BANDAGES/DRESSINGS) ×3 IMPLANT
TOWEL OR 17X24 6PK STRL BLUE (TOWEL DISPOSABLE) ×3 IMPLANT
TOWEL OR 17X26 10 PK STRL BLUE (TOWEL DISPOSABLE) ×3 IMPLANT
WATER STERILE IRR 1000ML POUR (IV SOLUTION) ×3 IMPLANT

## 2015-10-18 NOTE — H&P (Signed)
Subjective: The patient is a 75 year old white male who has complained of back, buttock, and leg pain consistent with neurogenic claudication. He has failed medical management and was worked up with a lumbar MRI. This demonstrated lumbar spinal stenosis . I discussed the situation with the patient. We discussed the various treatment options. He has decided to proceed with surgery.  Past Medical History  Diagnosis Date  . Hypertension   . BPH (benign prostatic hyperplasia)   . Cancer ([place])     rt kidney removed  . Chronic kidney disease     stones,rt kidney removed    Past Surgical History  Procedure Laterality Date  . Colon surgery      Allergies  Allergen Reactions  . Quinapril Cough  . Ambien [Zolpidem Tartrate] Other (See Comments)    Unspecified reaction  . Fenofibrate Other (See Comments)    Unknown  . Amoxicillin-Pot Clavulanate Nausea And Vomiting  . Morphine Nausea Only    Social History  Substance Use Topics  . Smoking status: Current Every Day Smoker -- 1.00 packs/day for 50 years  . Smokeless tobacco: Not on file  . Alcohol Use: No    History reviewed. No pertinent family history. Prior to Admission medications   Medication Sig Start Date End Date Taking? Authorizing Provider  aspirin EC 81 MG tablet Take 81 mg by mouth daily.   Yes Historical Provider, MD  diltiazem (DILACOR XR) 240 MG 24 hr capsule Take 240 mg by mouth daily.   Yes Historical Provider, MD  doxazosin (CARDURA) 4 MG tablet Take 4 mg by mouth daily.   Yes Historical Provider, MD  dutasteride (AVODART) 0.5 MG capsule Take 0.5 mg by mouth daily.   Yes Historical Provider, MD  gabapentin (NEURONTIN) 300 MG capsule Take 300-600 mg by mouth 3 (three) times daily. 300mg  in the morning, 300mg  in the afternoon, 600mg  in the evening   Yes Historical Provider, MD  HYDROcodone-acetaminophen (NORCO/VICODIN) 5-325 MG tablet Take 1 tablet by mouth every 4 (four) hours as needed for moderate pain.   Yes  Historical Provider, MD  omeprazole (PRILOSEC) 20 MG capsule Take 20 mg by mouth daily.   Yes Historical Provider, MD  traZODone (DESYREL) 50 MG tablet Take 150 mg by mouth at bedtime.    Yes Historical Provider, MD     Review of Systems  Positive ROS: As above  All other systems have been reviewed and were otherwise negative with the exception of those mentioned in the HPI and as above.  Objective: Vital signs in last 24 hours: Temp:  [97.9 F (36.6 C)] 97.9 F (36.6 C) (04/05 0722) Pulse Rate:  [58] 58 (04/05 0722) Resp:  [20] 20 (04/05 0722) SpO2:  [97 %] 97 % (04/05 0722) Weight:  [77.565 kg (171 lb)] 77.565 kg (171 lb) (04/05 0722)  General Appearance: Alert, cooperative, no distress, Head: Normocephalic, without obvious abnormality, atraumatic Eyes: PERRL, conjunctiva/corneas clear, EOM's intact,    Ears: Normal  Throat: Normal  Neck: Supple, symmetrical, trachea midline, no adenopathy; thyroid: No enlargement/tenderness/nodules; no carotid bruit or JVD Back: Symmetric, no curvature, ROM normal, no CVA tenderness Lungs: Clear to auscultation bilaterally, respirations unlabored Heart: Regular rate and rhythm, no murmur, rub or gallop Abdomen: Soft, non-tender,, no masses, no organomegaly Extremities: Extremities normal, atraumatic, no cyanosis or edema Pulses: 2+ and symmetric all extremities Skin: Skin color, texture, turgor normal, no rashes or lesions  NEUROLOGIC:   Mental status: alert and oriented, no aphasia, good attention span, Fund of knowledge/  memory ok Motor Exam - grossly normal Sensory Exam - grossly normal Reflexes:  Coordination - grossly normal Gait - grossly normal Balance - grossly normal Cranial Nerves: I: smell Not tested  II: visual acuity  OS: Normal  OD: Normal   II: visual fields Full to confrontation  II: pupils Equal, round, reactive to light  III,VII: ptosis None  III,IV,VI: extraocular muscles  Full ROM  V: mastication Normal  V:  facial light touch sensation  Normal  V,VII: corneal reflex  Present  VII: facial muscle function - upper  Normal  VII: facial muscle function - lower Normal  VIII: hearing Not tested  IX: soft palate elevation  Normal  IX,X: gag reflex Present  XI: trapezius strength  5/5  XI: sternocleidomastoid strength 5/5  XI: neck flexion strength  5/5  XII: tongue strength  Normal    Data Review Lab Results  Component Value Date   WBC 6.3 10/09/2015   HGB 15.0 10/09/2015   HCT 44.3 10/09/2015   MCV 92.3 10/09/2015   PLT 156 10/09/2015   Lab Results  Component Value Date   NA 140 10/09/2015   K 4.0 10/09/2015   CL 108 10/09/2015   CO2 23 10/09/2015   BUN 14 10/09/2015   CREATININE 1.33* 10/09/2015   GLUCOSE 105* 10/09/2015   No results found for: INR, PROTIME  Assessment/Plan: Lumbar spinal stenosis, lumbago, lumbar radiculopathy, neurogenic claudication: I have discussed the situation with the patient. I have reviewed his imaging studies with him and pointed out the abnormalities. We have discussed the various treatment options including surgery. I have described the surgical treatment option of a lumbar laminectomy. I have shown him surgical models. We have discussed the risks, benefits, alternatives, and likelihood of achieving our goals with surgery. I have answered all the patient's questions. He has decided to proceed with surgery.   Lasasha Brophy D 10/18/2015 9:28 AM

## 2015-10-18 NOTE — Op Note (Signed)
Brief history: The patient is a 75 year old white male who has complained of back, buttock, and leg pain consistent with neurogenic claudication. He has failed medical management and was worked up with a lumbar MRI. This demonstrated multifactorial spinal stenosis at L2-3, L3-4 and L4-5. I discussed the various treatment options with the patient including surgery. He has weighed the risks, benefits, and alternative surgery and decided proceed with a lumbar laminectomy.  Preoperative diagnosis: L2-3, L3-4 and L4-5 spinal stenosis, lumbago, lumbar radiculopathy, neurogenic claudication  Postoperative diagnosis: The same  Procedure: L2-3, L3-4 and L4-5 laminectomy/laminotomy/foraminotomies  using micro-dissection  Surgeon: Dr. Earle Gell  Asst.: None  Anesthesia: Gen. endotracheal  Estimated blood loss: 125 mL  Drains: None  Complications: None  Description of procedure: The patient was brought to the operating room by the anesthesia team. General endotracheal anesthesia was induced. The patient was turned to the prone position on the Wilson frame. The patient's lumbosacral region was then prepared with Betadine scrub and Betadine solution. Sterile drapes were applied.  I then injected the area to be incised with Marcaine with epinephrine solution. I then used a scalpel to make a linear midline incision over the L2-3, L3-4 and L4-5 intervertebral disc space. I then used electrocautery to perform a bilateral  subperiosteal dissection exposing the spinous process and lamina of L2, L3, L4 and L5. We obtained intraoperative radiograph to confirm our location. I then inserted the [facility] retractor for exposure. I incised the interspinous ligament at L2-3, L3-4 and L4-5 with a scalpel. I used the rongeurs to remove the spinous process of L3 and L4.  We then brought the operative microscope into the field. Under its magnification and illumination we completed the microdissection. I used a  high-speed drill to perform bilateral laminotomies at L2-3, L3-4 and L4-5. I then used a Kerrison punches to complete the laminectomy at L3 and L4 and to widen the laminotomies at L2-3 bilaterally and removed the ligamentum flavum at L2-3, L3-4 and L4-5 bilaterally. We then used microdissection to free up the thecal sac and the bilateral L3, L4 and L5 nerve root from the epidural tissue. I then used a Kerrison punch to perform a foraminotomy at about the bilateral L3, L4 and L5 nerve root. I inspected the intervertebral disc at L2-3, L3-4 and L4-5 bilaterally. The disc was bulging at these levels but I didn't see any significant herniations and therefore did not perform an intervertebral discectomy.  I then palpated along the ventral surface of the thecal sac and along exit route of the bilateral L3, L4 and L5 nerve root and noted that the neural structures were well decompressed. This completed the decompression.  We then obtained hemostasis using bipolar electrocautery. We irrigated the wound out with bacitracin solution. We then removed the retractor. We then reapproximated the patient's thoracolumbar fascia with interrupted #1 Vicryl suture. We then reapproximated the patient's subcutaneous tissue with interrupted 2-0 Vicryl suture. We then reapproximated patient's skin with Steri-Strips and benzoin. The was then coated with bacitracin ointment. The drapes were removed. The patient was subsequently returned to the supine position where they were extubated by the anesthesia team. The patient was then transported to the postanesthesia care unit in stable condition. All sponge instrument and needle counts were reportedly correct at the end of this case.

## 2015-10-18 NOTE — Evaluation (Addendum)
Physical Therapy Evaluation Patient Details Name: STARLING SCHUMACKER MRN: [id number] DOB: 1940-09-12 Today's Date: 10/18/2015   History of Present Illness  Patient is a 75 y/o male with hx of HTN, CKD and ca s/p L2-3, L3-4 and L4-5 laminectomy/laminotomy/foraminotomies.  Clinical Impression  Patient presents with pain, generalized weakness, numbness/tingling down RLE and impaired balance impacting mobility. Education pt and wife in back precautions. Pt very unsteady on feet and requires Min A for balance. LOB with retrogait. Recommend use of RW for support. Pt will have support from wife and mother at home. Will need to perform stair training tomorrow. Will follow acutely to maximize independence and mobility prior to return home.     Follow Up Recommendations Home health PT;Supervision/Assistance - 24 hour    Equipment Recommendations  Rolling walker with 5" wheels    Recommendations for Other Services OT consult     Precautions / Restrictions Precautions Precautions: Back;Fall Precaution Booklet Issued: No Precaution Comments: Reviewed back precautions. Restrictions Weight Bearing Restrictions: No      Mobility  Bed Mobility Overal bed mobility: Needs Assistance Bed Mobility: Supine to Sit;Sit to Supine     Supine to sit: Supervision;HOB elevated Sit to supine: Supervision   General bed mobility comments: Despite max cues for log roll technique pt getting to EOB using rail.   Transfers Overall transfer level: Needs assistance Equipment used: None Transfers: Sit to/from Stand Sit to Stand: Min guard         General transfer comment: Min guard for safety as pt unsteady on feet.   Ambulation/Gait Ambulation/Gait assistance: Min assist Ambulation Distance (Feet): 125 Feet Assistive device:  (IV pole) Gait Pattern/deviations: Scissoring;Drifts right/left;Step-through pattern;Decreased stride length Gait velocity: decreased   General Gait Details: SLow, unsteady  gait with pt reaching for rail and holding IV pole for support. Scissoring gait pattern at times. Would benefit from RW for support - pt reluctant. LOB with retro gait into bathroom  Stairs            Wheelchair Mobility    Modified Rankin (Stroke Patients Only)       Balance Overall balance assessment: Needs assistance Sitting-balance support: Feet supported;Single extremity supported Sitting balance-Leahy Scale: Fair     Standing balance support: During functional activity;Single extremity supported Standing balance-Leahy Scale: Fair Standing balance comment: Furniture walking within room requiring at least 1 UE support. Standing to urinate.                             Pertinent Vitals/Pain Pain Assessment: Faces Faces Pain Scale: Hurts even more Pain Location: incision  Pain Descriptors / Indicators: Operative site guarding;Sore Pain Intervention(s): Monitored during session;Repositioned;Patient requesting pain meds-RN notified    Home Living Family/patient expects to be discharged to:: Private residence Living Arrangements: Spouse/significant other;Parent Available Help at Discharge: Family;Available 24 hours/day Type of Home: House Home Access: Stairs to enter Entrance Stairs-Rails: Right Entrance Stairs-Number of Steps: 3 Home Layout: One level;Laundry or work area in [employer]: None      Prior Function Level of Independence: Independent;Needs assistance         Comments: Pt not able to walk more than 20 feet due to back pain. Mother and spouse are available for support.     Hand Dominance        Extremity/Trunk Assessment   Upper Extremity Assessment: Defer to OT evaluation           Lower Extremity Assessment:  Generalized weakness;RLE deficits/detail RLE Deficits / Details: Numbness and tingling down RLE       Communication   Communication: No difficulties  Cognition Arousal/Alertness: Awake/alert Behavior  During Therapy: WFL for tasks assessed/performed Overall Cognitive Status: Within Functional Limits for tasks assessed                      General Comments General comments (skin integrity, edema, etc.): Spouse present in room.     Exercises        Assessment/Plan    PT Assessment Patient needs continued PT services  PT Diagnosis Difficulty walking;Acute pain   PT Problem List Decreased strength;Pain;Impaired sensation;Decreased activity tolerance;Decreased balance;Decreased mobility;Decreased knowledge of precautions;Decreased knowledge of use of DME;Decreased safety awareness  PT Treatment Interventions Balance training;Gait training;Stair training;Functional mobility training;Therapeutic activities;Therapeutic exercise;Patient/family education;DME instruction   PT Goals (Current goals can be found in the Care Plan section) Acute Rehab PT Goals Patient 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 PT Goal Formulation: With patient/family Time For Goal Achievement: 11/01/15 Potential to Achieve Goals: Good    Frequency Min 5X/week   Barriers to discharge        Co-evaluation               End of Session Equipment Utilized During Treatment: Gait belt Activity Tolerance: Patient tolerated treatment well Patient left: in bed;with call bell/phone within reach;with family/visitor present;with SCD's reapplied Nurse Communication: Mobility status         Time: [id number] PT Time Calculation (min) (ACUTE ONLY): 21 min   Charges:   PT Evaluation $PT Eval Moderate Complexity: 1 Procedure     PT G Codes:        Marjon Doxtater A Harvest Deist 10/18/2015, 4:41 PM Wray Kearns, [place], DPT [phone]

## 2015-10-18 NOTE — Anesthesia Preprocedure Evaluation (Addendum)
Anesthesia Evaluation  Patient identified by MRN, date of birth, ID band Patient awake    Reviewed: Allergy & Precautions, NPO status , Patient's Chart, lab work & pertinent test results  Airway Mallampati: II  TM Distance: >3 FB Neck ROM: full    Dental  (+) Teeth Intact, Dental Advisory Given   Pulmonary Current Smoker,    breath sounds clear to auscultation       Cardiovascular hypertension, Pt. on medications  Rhythm:regular Rate:Normal     Neuro/Psych    GI/Hepatic   Endo/Other    Renal/GU Renal diseaseS/p right nephrectomy for CA.  H/o stones.     Musculoskeletal   Abdominal   Peds  Hematology   Anesthesia Other Findings   Reproductive/Obstetrics                            Anesthesia Physical Anesthesia Plan  ASA: II  Anesthesia Plan: General   Post-op Pain Management:    Induction: Intravenous  Airway Management Planned: Oral ETT  Additional Equipment:   Intra-op Plan:   Post-operative Plan: Extubation in OR  Informed Consent: I have reviewed the patients History and Physical, chart, labs and discussed the procedure including the risks, benefits and alternatives for the proposed anesthesia with the patient or authorized representative who has indicated his/her understanding and acceptance.     Plan Discussed with: CRNA, Anesthesiologist and Surgeon  Anesthesia Plan Comments:         Anesthesia Quick Evaluation

## 2015-10-18 NOTE — Progress Notes (Signed)
Subjective:  The patient is somnolent but arousable. He is in no apparent distress.  Objective: Vital signs in last 24 hours: Temp:  [97.9 F (36.6 C)] 97.9 F (36.6 C) (04/05 0722) Pulse Rate:  [58] 58 (04/05 0722) Resp:  [20] 20 (04/05 0722) SpO2:  [97 %] 97 % (04/05 0722) Weight:  [77.565 kg (171 lb)] 77.565 kg (171 lb) (04/05 0722)  Intake/Output from previous day:   Intake/Output this shift: Total I/O In: 1500 [I.V.:1500] Out: 200 [Blood:200]  Physical exam the patient is somnolent but arousable. He is moving his lower extremities well.  Lab Results: No results for input(s): WBC, HGB, HCT, PLT in the last 72 hours. BMET No results for input(s): NA, K, CL, CO2, GLUCOSE, BUN, CREATININE, CALCIUM in the last 72 hours.  Studies/Results: No results found.  Assessment/Plan: The patient is doing well.      Huzaifa Viney D 10/18/2015, 12:41 PM

## 2015-10-18 NOTE — Anesthesia Postprocedure Evaluation (Signed)
Anesthesia Post Note  Patient: Russell Terry  Procedure(s) Performed: Procedure(s) (LRB): LUMBAR LAMINECTOMY/DECOMPRESSION MICRODISCECTOMY 3 LEVELS (N/A)  Patient location during evaluation: PACU Anesthesia Type: General Level of consciousness: awake and alert Pain management: pain level controlled Vital Signs Assessment: post-procedure vital signs reviewed and stable Respiratory status: spontaneous breathing, nonlabored ventilation and respiratory function stable Cardiovascular status: blood pressure returned to baseline and stable Postop Assessment: no signs of nausea or vomiting Anesthetic complications: no    Last Vitals:  Filed Vitals:   10/18/15 1411 10/18/15 1646  BP: 137/77 139/73  Pulse: 71 72  Temp: 36.4 C 36.4 C  Resp: 16 16    Last Pain:  Filed Vitals:   10/18/15 1647  PainSc: 6                  Oceana Walthall A

## 2015-10-18 NOTE — Transfer of Care (Signed)
Immediate Anesthesia Transfer of Care Note  Patient: Russell Terry  Procedure(s) Performed: Procedure(s) with comments: LUMBAR LAMINECTOMY/DECOMPRESSION MICRODISCECTOMY 3 LEVELS (N/A) - L23 L34 L45 laminectomy and foraminotomy  Patient Location: PACU  Anesthesia Type:General  Level of Consciousness: awake and alert   Airway & Oxygen Therapy: Patient Spontanous Breathing  Post-op Assessment: Report given to RN, Post -op Vital signs reviewed and stable and Patient moving all extremities X 4  Post vital signs: Reviewed and stable  Last Vitals:  Filed Vitals:   10/18/15 0722  Pulse: 58  Temp: 36.6 C  Resp: 20    Complications: No apparent anesthesia complications

## 2015-10-18 NOTE — Anesthesia Procedure Notes (Signed)
Procedure Name: Intubation Date/Time: 10/18/2015 9:42 AM Performed by: Garrison Columbus T Pre-anesthesia Checklist: Patient identified, Emergency Drugs available, Suction available and Patient being monitored Patient Re-evaluated:Patient Re-evaluated prior to inductionOxygen Delivery Method: Circle system utilized Preoxygenation: Pre-oxygenation with 100% oxygen Intubation Type: IV induction Ventilation: Mask ventilation without difficulty and Oral airway inserted - appropriate to patient size Laryngoscope Size: Sabra Heck and 2 Grade View: Grade I Tube type: Oral Tube size: 7.5 mm Number of attempts: 1 Airway Equipment and Method: Stylet and Oral airway Placement Confirmation: ETT inserted through vocal cords under direct vision,  positive ETCO2 and breath sounds checked- equal and bilateral Secured at: 23 cm Tube secured with: Tape Dental Injury: Teeth and Oropharynx as per pre-operative assessment

## 2015-10-19 ENCOUNTER — Encounter (HOSPITAL_COMMUNITY): Payer: Self-pay | Admitting: Neurosurgery

## 2015-10-19 DIAGNOSIS — R269 Unspecified abnormalities of gait and mobility: Secondary | ICD-10-CM | POA: Diagnosis not present

## 2015-10-19 DIAGNOSIS — M4806 Spinal stenosis, lumbar region: Secondary | ICD-10-CM | POA: Diagnosis not present

## 2015-10-19 MED ORDER — DEXAMETHASONE SODIUM PHOSPHATE 4 MG/ML IJ SOLN
4.0000 mg | Freq: Four times a day (QID) | INTRAMUSCULAR | Status: AC
  Administered 2015-10-19 – 2015-10-20 (×2): 4 mg via INTRAVENOUS
  Filled 2015-10-19 (×2): qty 1

## 2015-10-19 NOTE — Progress Notes (Signed)
PT eval addendum - added G-codes    10/22/15 1646  PT G-Codes **NOT FOR INPATIENT CLASS**  Functional Assessment Tool Used clinical judgment  Functional Limitation Mobility: Walking and moving around  Mobility: Walking and Moving Around Current Status [id number]) CJ  Mobility: Walking and Moving Around Goal Status [id number]) CI   Wray Kearns, PT, DPT [phone]

## 2015-10-19 NOTE — Progress Notes (Signed)
Patient ID: Russell Terry, male   DOB: Nov 20, 1940, 75 y.o.   MRN: [id number] Subjective:  The patient is somnolent but easily arousable. He complains of back and leg soreness.  Objective: Vital signs in last 24 hours: Temp:  [97.8 F (36.6 C)-100.8 F (38.2 C)] 97.8 F (36.6 C) (04/06 1710) Pulse Rate:  [63-77] 70 (04/06 1710) Resp:  [18] 18 (04/06 1710) BP: (115-143)/(66-80) 120/71 mmHg (04/06 1710) SpO2:  [93 %-97 %] 93 % (04/06 1710)  Intake/Output from previous day: 04/05 0701 - 04/06 0700 In: 1500 [I.V.:1500] Out: 1500 [Urine:1300; Blood:200] Intake/Output this shift: Total I/O In: 720 [P.O.:720] Out: 225 [Urine:225]  Physical exam the patient is somnolent but easily arousable. He is oriented. He is moving his lower extremities well.  Lab Results: No results for input(s): WBC, HGB, HCT, PLT in the last 72 hours. BMET No results for input(s): NA, K, CL, CO2, GLUCOSE, BUN, CREATININE, CALCIUM in the last 72 hours.  Studies/Results: Dg Lumbar Spine 1 View  10/18/2015  CLINICAL DATA:  L2 through L5 laminectomy and foraminotomy. EXAM: LUMBAR SPINE - 1 VIEW COMPARISON:  Lumbar MRI 09/11/2015 FINDINGS: Single lateral view was obtained in the operating room. Lowest disc space labeled L5-S1 consistent with the prior MRI. Normal alignment. Moderate disc degeneration L 3-4. Advanced disc degeneration L4-5 and L5-S1 Surgical instrument is present at the level the inferior margin of the L3 lamina directed at the L3-4 disc space IMPRESSION: L3-4 disc space localized in the operating room. Electronically Signed   By: Franchot Gallo M.D.   On: 10/18/2015 13:07    Assessment/Plan: Postop day #1: We will continue to mobilize the patient. I will add steroids for an formation. He will likely go home tomorrow. I spoke with his wife.      [name] D 10/19/2015, 5:57 PM

## 2015-10-19 NOTE — Progress Notes (Signed)
Physical Therapy Treatment Patient Details Name: Russell Terry MRN: [id number] DOB: 1941-02-21 Today's Date: 10/19/2015    History of Present Illness Patient is a 75 y/o male with hx of HTN, CKD and ca s/p L2-3, L3-4 and L4-5 laminectomy/laminotomy/foraminotomies.    PT Comments    Pt progressing towards physical therapy goals. Was able to perform transfers and ambulation with close guard for safety however continues to appear unsteady. Recommending use of RW at home and pt and wife were educated on general safety with mobility. Significant time was spent on bed mobility training. Wife present and providing cues for maintenance of back precautions. Will continue to follow.    Follow Up Recommendations  Home health PT;Supervision/Assistance - 24 hour     Equipment Recommendations  3in1 (PT)    Recommendations for Other Services OT consult     Precautions / Restrictions Precautions Precautions: Back;Fall Precaution Booklet Issued: Yes (comment) Precaution Comments: Reviewed handout and pt was cued for precautions throughout functional mobility. At end of session after several attempts at pt education, pt was able to recall 0/3 back precautions.  Restrictions Weight Bearing Restrictions: No    Mobility  Bed Mobility Overal bed mobility: Needs Assistance Bed Mobility: Rolling;Sidelying to Sit;Sit to Sidelying Rolling: Min guard Sidelying to sit: Min guard     Sit to sidelying: Min guard General bed mobility comments: Step-by-step cues for log roll technique. Pt was cued to stop and start over if not following proper precautions - this method was time consuming but effective in pt/family education.   Transfers Overall transfer level: Needs assistance Equipment used: None Transfers: Sit to/from Stand Sit to Stand: Min guard         General transfer comment: Hands-on guarding provided as pt powered-up to full standing position. Pt was cued for improved  posture.  Ambulation/Gait Ambulation/Gait assistance: Min assist Ambulation Distance (Feet): 150 Feet Assistive device: None Gait Pattern/deviations: Step-through pattern;Decreased stride length;Trunk flexed Gait velocity: Decreased Gait velocity interpretation: Below normal speed for age/gender General Gait Details: Slow, unsteady gait with pt reaching for rail. Pt declined use of RW however continue to recommend that pt use it at home. Appeared agreeable to use RW at end off session and wife was educated on general safety awareness.    Stairs            Wheelchair Mobility    Modified Rankin (Stroke Patients Only)       Balance Overall balance assessment: Needs assistance Sitting-balance support: Feet supported;No upper extremity supported Sitting balance-Leahy Scale: Fair     Standing balance support: During functional activity;Single extremity supported Standing balance-Leahy Scale: Fair                      Cognition Arousal/Alertness: Awake/alert Behavior During Therapy: WFL for tasks assessed/performed Overall Cognitive Status: Within Functional Limits for tasks assessed       Memory: Decreased recall of precautions;Decreased short-term memory              Exercises      General Comments General comments (skin integrity, edema, etc.): Spouse present in room      Pertinent Vitals/Pain Pain Assessment: Faces Faces Pain Scale: Hurts little more Pain Location: Incision Pain Descriptors / Indicators: Operative site guarding;Aching Pain Intervention(s): Limited activity within patient's tolerance;Monitored during session;Repositioned    Home Living                      Prior Function  PT Goals (current goals can now be found in the care plan section) Acute Rehab PT Goals Patient 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 PT Goal Formulation: With patient/family Time For Goal Achievement: 11/01/15 Potential to Achieve  Goals: Good Progress towards PT goals: Progressing toward goals    Frequency  Min 5X/week    PT Plan Current plan remains appropriate    Co-evaluation             End of Session Equipment Utilized During Treatment: Gait belt Activity Tolerance: Patient tolerated treatment well Patient left: in chair;with call bell/phone within reach;with family/visitor present     Time: [id number] PT Time Calculation (min) (ACUTE ONLY): 39 min  Charges:  $Gait Training: 38-52 mins                    G Codes:      Rolinda Roan 2015/10/26, 11:25 AM   Rolinda Roan, PT, DPT Acute Rehabilitation Services Pager: [phone]

## 2015-10-19 NOTE — Care Management Note (Signed)
Case Management Note  Patient Details  Name: Russell Terry MRN: [id number] Date of Birth: Sep 25, 1940  Subjective/Objective:         S/p L2-3, L3-4, L4-5 laminectomies           Action/Plan: Spoke with patient and his wife, given choice, they selected [facility]. Contacted Susan at [place] and set up [place] [facility] [place]. Wife will be assisting patient after discharge. 3n1 delivered to patient's room by [facility].    Expected Discharge Date:                  Expected Discharge Plan:  [place]  In-House Referral:  NA  Discharge planning Services  CM Consult  Post Acute Care Choice:  Home Health, Durable Medical Equipment Choice offered to:  Patient  DME Arranged:  3-N-1 DME Agency:  [place]:  PT, OT [facility] Agency:  [place]  Status of Service:  Completed, signed off  Medicare Important Message Given:    Date Medicare IM Given:    Medicare IM give by:    Date Additional Medicare IM Given:    Additional Medicare Important Message give by:     If discussed at [place] of Stay Meetings, dates discussed:    Additional Comments:  Nila Nephew, RN 10/19/2015, 11:56 AM

## 2015-10-20 DIAGNOSIS — M4806 Spinal stenosis, lumbar region: Secondary | ICD-10-CM | POA: Diagnosis not present

## 2015-10-20 MED ORDER — TAMSULOSIN HCL 0.4 MG PO CAPS
0.8000 mg | ORAL_CAPSULE | Freq: Every day | ORAL | Status: DC
Start: 2015-10-20 — End: 2015-10-21
  Administered 2015-10-20 – 2015-10-21 (×2): 0.8 mg via ORAL
  Filled 2015-10-20 (×2): qty 2

## 2015-10-20 NOTE — Progress Notes (Signed)
Physical Therapy Treatment Patient Details Name: Russell Terry MRN: [id number] DOB: March 05, 1941 Today's Date: 10/20/2015    History of Present Illness Patient is a 76 y/o male with hx of HTN, CKD and ca s/p L2-3, L3-4 and L4-5 laminectomy/laminotomy/foraminotomies.    PT Comments    Pt progressing towards physical therapy goals. Appeared to be able to focus a little better this session without the distraction of family in the room. Pt concerned about getting in and out of bed and up/down from a chair at home, however pt not currently requiring a significant amount of assistance at this time. Do not anticipate that pt will have difficulty with basic transfers at home, and will have wife for assist if needed.   Follow Up Recommendations  Home health PT;Supervision/Assistance - 24 hour     Equipment Recommendations  3in1 (PT)    Recommendations for Other Services OT consult     Precautions / Restrictions Precautions Precautions: Back;Fall Precaution Booklet Issued: Yes (comment) Precaution Comments: Reviewed handout and pt was cued for precautions throughout functional mobility. At end of session after several attempts at pt education, pt was able to recall 0/3 back precautions.  Restrictions Weight Bearing Restrictions: No    Mobility  Bed Mobility Overal bed mobility: Needs Assistance Bed Mobility: Rolling;Sidelying to Sit Rolling: Supervision Sidelying to sit: Min guard       General bed mobility comments: Step-by-step cues for log roll technique. Pt with heavy use of rails and min guard for trunk support as pt transitions to full sitting position.   Transfers Overall transfer level: Needs assistance Equipment used: None Transfers: Sit to/from Stand Sit to Stand: Min guard         General transfer comment: Hands-on guarding provided as pt powered-up to full standing position. Pt was cued for improved posture and hand placement on seated surface for safety.    Ambulation/Gait Ambulation/Gait assistance: Min guard Ambulation Distance (Feet): 200 Feet Assistive device: Rolling walker (2 wheeled) Gait Pattern/deviations: Step-through pattern;Decreased stride length;Trunk flexed Gait velocity: Decreased Gait velocity interpretation: Below normal speed for age/gender General Gait Details: Slow and guarded gait with many rest breaks due to fatigue. Pt was cued for improved posture and forward gaze.    Stairs            Wheelchair Mobility    Modified Rankin (Stroke Patients Only)       Balance Overall balance assessment: Needs assistance Sitting-balance support: Feet supported;No upper extremity supported Sitting balance-Leahy Scale: Fair     Standing balance support: During functional activity;Single extremity supported Standing balance-Leahy Scale: Fair                      Cognition Arousal/Alertness: Awake/alert Behavior During Therapy: WFL for tasks assessed/performed Overall Cognitive Status: Within Functional Limits for tasks assessed       Memory: Decreased recall of precautions;Decreased short-term memory              Exercises      General Comments        Pertinent Vitals/Pain Pain Assessment: Faces Faces Pain Scale: Hurts little more Pain Location: incision site Pain Descriptors / Indicators: Operative site guarding Pain Intervention(s): Limited activity within patient's tolerance;Monitored during session;Repositioned    Home Living                      Prior Function            PT Goals (current goals  can now be found in the care plan section) Acute Rehab PT Goals Patient 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 PT Goal Formulation: With patient/family Time For Goal Achievement: 11/01/15 Potential to Achieve Goals: Good Progress towards PT goals: Progressing toward goals    Frequency  Min 5X/week    PT Plan Current plan remains appropriate    Co-evaluation              End of Session Equipment Utilized During Treatment: Gait belt Activity Tolerance: Patient tolerated treatment well Patient left: in chair;with call bell/phone within reach;with family/visitor present     Time: [id number] PT Time Calculation (min) (ACUTE ONLY): 24 min  Charges:  $Gait Training: 23-37 mins                    G Codes:      Rolinda Roan 2015/11/09, 10:43 AM   Rolinda Roan, PT, DPT Acute Rehabilitation Services Pager: [phone]

## 2015-10-20 NOTE — Progress Notes (Signed)
Patient ID: Russell Terry, male   DOB: [birth year], 75 y.o.   MRN: [id number] Subjective:  The patient is alert and pleasant. He has had urinary retention and had a Foley catheter replaced. The patient has had problems with his bladder before and sees a urologist in [place]. He has been discharged with a Foley catheter in place in the past as well. He doesn't want to go home today.  Objective: Vital signs in last 24 hours: Temp:  [97.8 F (36.6 C)-98.4 F (36.9 C)] 98.3 F (36.8 C) (04/07 0400) Pulse Rate:  [63-77] 71 (04/07 0400) Resp:  [16-18] 16 (04/07 0400) BP: (115-154)/(66-83) 154/83 mmHg (04/07 0400) SpO2:  [93 %-99 %] 99 % (04/07 0400)  Intake/Output from previous day: 04/06 0701 - 04/07 0700 In: 720 [P.O.:720] Out: 1125 [Urine:1125] Intake/Output this shift:    Physical exam the patient is alert and pleasant. His dressing is clean and dry. He is moving his lower extremities well.  Lab Results: No results for input(s): WBC, HGB, HCT, PLT in the last 72 hours. BMET No results for input(s): NA, K, CL, CO2, GLUCOSE, BUN, CREATININE, CALCIUM in the last 72 hours.  Studies/Results: Dg Lumbar Spine 1 View  10/18/2015  CLINICAL DATA:  L2 through L5 laminectomy and foraminotomy. EXAM: LUMBAR SPINE - 1 VIEW COMPARISON:  Lumbar MRI 09/11/2015 FINDINGS: Single lateral view was obtained in the operating room. Lowest disc space labeled L5-S1 consistent with the prior MRI. Normal alignment. Moderate disc degeneration L 3-4. Advanced disc degeneration L4-5 and L5-S1 Surgical instrument is present at the level the inferior margin of the L3 lamina directed at the L3-4 disc space IMPRESSION: L3-4 disc space localized in the operating room. Electronically Signed   By: Franchot Gallo M.D.   On: 10/18/2015 13:07    Assessment/Plan: Postop day #1: The patient is doing well except as below  Urinary retention: This has been a chronic problem for the patient. He sees a urologist in [place]. He has gone home with a Foley catheter in place in the past. The plan is to add Flomax to his regimen. We will try to discontinue the Foley catheter tomorrow morning. He will go home either way with or without a catheter tomorrow. If he goes home with a catheter he can follow-up with his urologist in [facility] for removal. I have given the patient and his wife discharge instructions and answered all her questions.      Ronna Herskowitz D 10/20/2015, 7:23 AM

## 2015-10-21 DIAGNOSIS — M4806 Spinal stenosis, lumbar region: Secondary | ICD-10-CM | POA: Diagnosis not present

## 2015-10-21 MED ORDER — TAMSULOSIN HCL 0.4 MG PO CAPS: 0.8000 mg | ORAL_CAPSULE | Freq: Every day | ORAL | Status: DC

## 2015-10-21 MED ORDER — HYDROCODONE-ACETAMINOPHEN 5-325 MG PO TABS: 1.0000 | ORAL_TABLET | ORAL | Status: DC | PRN

## 2015-10-21 NOTE — Progress Notes (Signed)
Pt and wife given D/C instructions with Rx's,verbal understanding was provided. Pt's incision is clean and dry with no sign of infection. Pt's IV was removed prior to D/C. Pt D/C'd home with Foley cath per MD order. Pt D/C'd home via wheelchair @ 1050 per MD order. Pt is stable @ D/C and has no other needs at this time. Holli Humbles, RN

## 2015-10-21 NOTE — Progress Notes (Signed)
Voiding trial was unsuccessful. Pt attempted to void x 3 without any results. Bladder scan was performed and was 464mL.  Pt stated he "felt the urge but could not make it come out". Foley cath was placed and education was given to Pt and Pt's wife about Foley care at home. Pt's wife has arranged for Pt to follow-up with his urologist next week. Pt will be D/C'd home with Foley per MD order. Holli Humbles, RN

## 2015-10-21 NOTE — Discharge Instructions (Signed)
Lifting no bending no twisting no driving a riding a car less he is calm back and forth to see Dr. Arnoldo Terry. Keep incision clean dry and intact.   Call MD for: Difficulty breathing, headache or visual disturbances, extreme fatigue  Call MD for: hives  Call MD for: persistant dizziness or light-headedness  Call MD for: persistant nausea and vomiting  Call MD for: redness, tenderness, or signs of infection (pain, swelling, redness, odor or green/yellow discharge around incision site)  Call MD for: severe uncontrolled pain  Call MD for: temperature >100.4 Diet - low sodium heart healthy Discharge instructions   Call [phone] for a followup appointment.   Increase activity slowly Remove dressing in 48 hours   Laminectomy During a laminectomy, small pieces of bone in the spine called lamina are removed. The ligaments underneath the lamina and parts of the joints that have grown too large are also removed. This takes pressure off the nerves.  LET [facility] CARE PROVIDER KNOW ABOUT:  Any allergies you have.  All medicines you are taking, including vitamins, herbs, eye drops, creams, and over-the-counter medicines.  Previous problems you or members of your family have had with the use of anesthetics.  Any blood disorders you have.  Previous surgeries you have had.  Medical conditions you have. RISKS AND COMPLICATIONS  Generally, laminectomy is a safe procedure. However, as with any procedure, complications can occur. Possible complications include: 1. Infection near the incision. 2. Nerve damage. Signs of this can be pain, weakness, or numbness. 3. Leaking of spinal fluid. 4. Blood clot in a leg. The clot can move to the lungs. This can be very serious. 5. Bowel or bladder incontinence (rare). BEFORE THE PROCEDURE  1. You will need to stop taking certain medicines as directed by your health care provider. 2. If you smoke, stop at least 2 weeks before the procedure. Smoking can slow  down the healing process and increase the risk of complications. 3. Do not eat or drink anything for at least 8 hours before the procedure. Take any medicines that your health care provider tells you to keep taking with a sip of water. 4. Do not drink alcohol the day before your surgery. 5. Tell your health care provider if you develop a cold or any infection before your surgery. 6. Arrange for someone to drive you home after the procedure or after your hospital stay. Also arrange for someone to help you with activities during recovery. PROCEDURE 1. Small monitors will be placed on your body. They are used to check your heart, blood pressure, and oxygen level. 2. An IV tube will be inserted into one of your veins. Medicine will flow directly into your body through the IV tube. 3. You might be given a sedative. This will help you relax. 4. You will be given a medicine to make you sleep (general anesthetic), and a breathing tube will be placed into your lungs. During general anesthesia, you are unaware of the procedure and do not feel any pain. 5. Your back will be cleaned with a special solution to kill germs on your skin. 6. Once you are asleep, the surgeon will make a 2-inch to 5-inch cut (incision) in your back. The length of the incision will depend on how many spinal bones (vertebrae) are being operated on. 7. Muscles in the back will be moved away from the vertebrae and pulled to the side. 8. Pieces of lamina will be removed. 9. The ligament that lies under the  lamina and connects your vertebrae will be removed. 10. Enough ligaments and thickened joints will be removed to take pressure off your nerves. 11. Your nerves will be identified, and their passage will be tracked and assessed for excessive tightness. 12. Your back muscles will be moved back into their normal position. 13. The area under your skin will be closed with small, absorbable stitches. These stitches do not need to be  removed. 14. Your skin will be closed with small absorbable stitches or staples. 15. A dressing will be put over your incision. 16. The procedure may take 1-3 hours. AFTER THE PROCEDURE  1. You will stay in a recovery area until the anesthesia has worn off. Your blood pressure and pulse will be checked every so often. Then you will be taken to a hospital room. 2. You may continue to get fluids through the IV tube for a while. 3. Some pain is normal. You may be given pain medicine while still in the recovery area. 4. It is important to be up and moving as soon as possible after a surgery. Physical therapists will help you start walking. 5. To prevent blood clots in your legs: 1. You may be given special stockings to wear. 2. You may need to take medicine to prevent clots. 6. You may be asked to do special breathing exercises to re-expand your lungs. This is to prevent a lung infection. 7. Most people stay in the hospital for 1-3 days after a laminectomy.   This information is not intended to replace advice given to you by your health care provider. Make sure you discuss any questions you have with your health care provider.   Document Released: 06/19/2009 Document Revised: 04/21/2013 Document Reviewed: 02/10/2013 Elsevier Interactive Patient Education 2016 [place], Adult A Foley catheter is a soft, flexible tube that is placed into the bladder to drain urine. A Foley catheter may be inserted if:  You leak urine or are not able to control when you urinate (urinary incontinence).  You are not able to urinate when you need to (urinary retention).  You had prostate surgery or surgery on the genitals.  You have certain medical conditions, such as multiple sclerosis, dementia, or a spinal cord injury. If you are going home with a Foley catheter in place, follow the instructions below. TAKING CARE OF THE CATHETER 6. Wash your hands with soap and water. 7. Using  mild soap and warm water on a clean washcloth:  Clean the area on your body closest to the catheter insertion site using a circular motion, moving away from the catheter. Never wipe toward the catheter because this could sweep bacteria up into the urethra and cause infection.  Remove all traces of soap. Pat the area dry with a clean towel. For males, reposition the foreskin. 8. Attach the catheter to your leg so there is no tension on the catheter. Use adhesive tape or a leg strap. If you are using adhesive tape, remove any sticky residue left behind by the previous tape you used. 9. Keep the drainage bag below the level of the bladder, but keep it off the floor. 10. Check throughout the day to be sure the catheter is working and urine is draining freely. Make sure the tubing does not become kinked. 11. Do not pull on the catheter or try to remove it. Pulling could damage internal tissues. TAKING CARE OF THE DRAINAGE BAGS You will be given two drainage bags to take  home. One is a large overnight drainage bag, and the other is a smaller leg bag that fits underneath clothing. You may wear the overnight bag at any time, but you should never wear the smaller leg bag at night. Follow the instructions below for how to empty, change, and clean your drainage bags. Emptying the Drainage Bag You must empty your drainage bag when it is  - full or at least 2-3 times a day. 7. Wash your hands with soap and water. 8. Keep the drainage bag below your hips, below the level of your bladder. This stops urine from going back into the tubing and into your bladder. 9. Hold the dirty bag over the toilet or a clean container. 10. Open the pour spout at the bottom of the bag and empty the urine into the toilet or container. Do not let the pour spout touch the toilet, container, or any other surface. Doing so can place bacteria on the bag, which can cause an infection. 11. Clean the pour spout with a gauze pad or cotton  ball that has rubbing alcohol on it. 12. Close the pour spout. 13. Attach the bag to your leg with adhesive tape or a leg strap. 14. Wash your hands well. Changing the Drainage Bag Change your drainage bag once a month or sooner if it starts to smell bad or look dirty. Below are steps to follow when changing the drainage bag. 41. Wash your hands with soap and water. 18. Pinch off the rubber catheter so that urine does not spill out. 13. Disconnect the catheter tube from the drainage tube at the connection valve. Do not let the tubes touch any surface. 20. Clean the end of the catheter tube with an alcohol wipe. Use a different alcohol wipe to clean the end of the drainage tube. 21. Connect the catheter tube to the drainage tube of the clean drainage bag. 22. Attach the new bag to the leg with adhesive tape or a leg strap. Avoid attaching the new bag too tightly. 23. Wash your hands well. Cleaning the Drainage Bag 8. Wash your hands with soap and water. 9. Wash the bag in warm, soapy water. 10. Rinse the bag thoroughly with warm water. 11. Fill the bag with a solution of white vinegar and water (1 cup vinegar to 1 qt warm water [.2 L vinegar to 1 L warm water]). Close the bag and soak it for 30 minutes in the solution. 12. Rinse the bag with warm water. 46. Hang the bag to dry with the pour spout open and hanging downward. 14. Store the clean bag (once it is dry) in a clean plastic bag. 15. Wash your hands well. PREVENTING INFECTION  Wash your hands before and after handling your catheter.  Take showers daily and wash the area where the catheter enters your body. Do not take baths. Replace wet leg straps with dry ones, if this applies.  Do not use powders, sprays, or lotions on the genital area. Only use creams, lotions, or ointments as directed by your caregiver.  For females, wipe from front to back after each bowel movement.  Drink enough fluids to keep your urine clear or pale  yellow unless you have a fluid restriction.  Do not let the drainage bag or tubing touch or lie on the floor.  Wear cotton underwear to absorb moisture and to keep your skin drier. SEEK MEDICAL CARE IF:   Your urine is cloudy or smells unusually bad.  Your  catheter becomes clogged.  You are not draining urine into the bag or your bladder feels full.  Your catheter starts to leak. SEEK IMMEDIATE MEDICAL CARE IF:   You have pain, swelling, redness, or pus where the catheter enters the body.  You have pain in the abdomen, legs, lower back, or bladder.  You have a fever.  You see blood fill the catheter, or your urine is pink or red.  You have nausea, vomiting, or chills.  Your catheter gets pulled out. MAKE SURE YOU:   Understand these instructions.  Will watch your condition.  Will get help right away if you are not doing well or get worse.   This information is not intended to replace advice given to you by your health care provider. Make sure you discuss any questions you have with your health care provider.   Document Released: 07/01/2005 Document Revised: 11/15/2013 Document Reviewed: 06/22/2012 Elsevier Interactive Patient Education [employer].

## 2015-10-21 NOTE — Discharge Summary (Signed)
  Physician Discharge Summary  Patient ID: Russell Terry MRN: [id number] DOB/AGE: [birth year] 75 y.o.  Admit date: 10/18/2015 Discharge date: 10/21/2015  Admission Diagnoses:Lumbar spinal stenosis  Discharge Diagnoses: Same Active Problems:   Lumbar stenosis with neurogenic claudication   Discharged Condition: good  Hospital Course: Patient is a hospital underwent 3 level decompressive lumbar limiting postoperative patient did fairly well with recovered in the floor on the floor was on some difficulty voiding with urinary retention had a catheter back replaced this was taken out on the morning of postoperative day and patient be discharged if able to void otherwise will have a catheter replaced discharge was follow-up with urology.  Consults: Significant Diagnostic Studies: Treatments: 3 level decompressive lumbar laminectomy Discharge Exam: Blood pressure 123/78, pulse 62, temperature 97.7 F (36.5 C), temperature source Oral, resp. rate 18, height 5' 9.5" (1.765 m), weight 77.565 kg (171 lb), SpO2 97 %. Strength out of 5 wound clean dry and intact  Disposition: Home     Medication List    TAKE these medications        aspirin EC 81 MG tablet  Take 81 mg by mouth daily.     diltiazem 240 MG 24 hr capsule  Commonly known as:  DILACOR XR  Take 240 mg by mouth daily.     doxazosin 4 MG tablet  Commonly known as:  CARDURA  Take 4 mg by mouth daily.     dutasteride 0.5 MG capsule  Commonly known as:  AVODART  Take 0.5 mg by mouth daily.     gabapentin 300 MG capsule  Commonly known as:  NEURONTIN  Take 300-600 mg by mouth 3 (three) times daily. 300mg  in the morning, 300mg  in the afternoon, 600mg  in the evening     HYDROcodone-acetaminophen 5-325 MG tablet  Commonly known as:  NORCO/VICODIN  Take 1 tablet by mouth every 4 (four) hours as needed for moderate pain.     HYDROcodone-acetaminophen 5-325 MG tablet  Commonly known as:  NORCO/VICODIN  Take 1-2 tablets by  mouth every 4 (four) hours as needed for moderate pain.     omeprazole 20 MG capsule  Commonly known as:  PRILOSEC  Take 20 mg by mouth daily.     tamsulosin 0.4 MG Caps capsule  Commonly known as:  FLOMAX  Take 2 capsules (0.8 mg total) by mouth daily.     traZODone 50 MG tablet  Commonly known as:  DESYREL  Take 150 mg by mouth at bedtime.           Follow-up Information    Follow up with [facility].   Why:  They will contact you to schedule home therapy visits.   Contact information:   [address] [place] [address] [phone]       Follow up with Ophelia Charter, MD.   Specialty:  Neurosurgery   Contact information:   [address] [place] [address] [phone]       Signed: Elaina Hoops 10/21/2015, 8:17 AM

## 2015-10-21 NOTE — Progress Notes (Signed)
Physical Therapy Treatment Patient Details Name: Russell Terry MRN: [id number] DOB: Nov 01, 1940 Today's Date: 10/21/2015    History of Present Illness Patient is a 75 y/o male with hx of HTN, CKD and ca s/p L2-3, L3-4 and L4-5 laminectomy/laminotomy/foraminotomies.    PT Comments    Pt progressing towards physical therapy goals. Was able to improve overall tolerance for functional mobility, however continues to have difficulty with sequencing and proper technique for transfers while maintaining back precautions. Wife present and is able to cue pt appropriately for log roll, however pt appears confused and continuously attempts to sit straight up in bed instead of rolling to the side first. Pt and wife anticipate d/c home today.   Follow Up Recommendations  Home health PT;Supervision/Assistance - 24 hour     Equipment Recommendations  3in1 (PT)    Recommendations for Other Services OT consult     Precautions / Restrictions Precautions Precautions: Back;Fall Precaution Booklet Issued: Yes (comment) Precaution Comments: Reviewed handout and pt was cued for precautions throughout functional mobility. At end of session after several attempts at pt education, pt was able to recall 0/3 back precautions.  Restrictions Weight Bearing Restrictions: No    Mobility  Bed Mobility Overal bed mobility: Needs Assistance Bed Mobility: Rolling;Sidelying to Sit Rolling: Supervision Sidelying to sit: Min guard;Supervision     Sit to sidelying: Min guard;Supervision General bed mobility comments: HOB flat and bed rails lowered to simulate home environment. Pt continues to have difficulty remembering proper sequencing for log roll and appears to be confused when cued to roll instead of sitting straight up in bed. Several attempts were made for pt to practice proper log roll technique with ~50% success.   Transfers Overall transfer level: Needs assistance Equipment used: None Transfers: Sit  to/from Stand Sit to Stand: Min guard         General transfer comment: Hands-on guarding provided as pt powered-up to full standing position. Pt was cued for improved posture and hand placement on seated surface for safety.   Ambulation/Gait Ambulation/Gait assistance: Min guard Ambulation Distance (Feet): 200 Feet Assistive device: Rolling walker (2 wheeled) Gait Pattern/deviations: Step-through pattern;Decreased stride length;Trunk flexed Gait velocity: Decreased Gait velocity interpretation: Below normal speed for age/gender General Gait Details: Slow and guarded gait with many rest breaks due to fatigue. Pt was cued for improved posture and forward gaze.    Stairs            Wheelchair Mobility    Modified Rankin (Stroke Patients Only)       Balance Overall balance assessment: Needs assistance Sitting-balance support: Feet supported;No upper extremity supported Sitting balance-Leahy Scale: Fair     Standing balance support: Bilateral upper extremity supported;During functional activity Standing balance-Leahy Scale: Poor Standing balance comment: Requires UE support for safe mobility                    Cognition Arousal/Alertness: Awake/alert Behavior During Therapy: WFL for tasks assessed/performed Overall Cognitive Status: Within Functional Limits for tasks assessed       Memory: Decreased recall of precautions;Decreased short-term memory              Exercises      General Comments General comments (skin integrity, edema, etc.): Spouse present in room       Pertinent Vitals/Pain Pain Assessment: Faces Faces Pain Scale: Hurts a little bit Pain Location: Incision Pain Descriptors / Indicators: Operative site guarding;Sore Pain Intervention(s): Limited activity within patient's tolerance;Monitored during session;Repositioned  Home Living                      Prior Function            PT Goals (current goals can now be  found in the care plan section) Acute Rehab PT Goals Patient 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 PT Goal Formulation: With patient/family Time For Goal Achievement: 11/01/15 Potential to Achieve Goals: Good Progress towards PT goals: Progressing toward goals    Frequency  Min 5X/week    PT Plan Current plan remains appropriate    Co-evaluation             End of Session Equipment Utilized During Treatment: Gait belt Activity Tolerance: Patient tolerated treatment well Patient left: in chair;with call bell/phone within reach;with family/visitor present     Time: 0910-0935 PT Time Calculation (min) (ACUTE ONLY): 25 min  Charges:  $Gait Training: 23-37 mins                    G Codes:      Rolinda Roan 2015-11-07, 10:20 AM   Rolinda Roan, PT, DPT Acute Rehabilitation Services Pager: [phone]

## 2015-10-21 NOTE — Progress Notes (Signed)
Patient ID: Russell Terry, male   DOB: [birth year], 75 y.o.   MRN: [id number] Doing well improved leg pain minimal back pain still no void a catheter is only been out about 4 hours  Strength out of 5 wound clean dry and intact  Discharged later with catheter in place if unable to void.

## 2015-10-22 DIAGNOSIS — R269 Unspecified abnormalities of gait and mobility: Secondary | ICD-10-CM | POA: Diagnosis not present

## 2015-10-22 DIAGNOSIS — N189 Chronic kidney disease, unspecified: Secondary | ICD-10-CM | POA: Diagnosis not present

## 2015-10-22 DIAGNOSIS — I129 Hypertensive chronic kidney disease with stage 1 through stage 4 chronic kidney disease, or unspecified chronic kidney disease: Secondary | ICD-10-CM | POA: Diagnosis not present

## 2015-10-22 DIAGNOSIS — Z4789 Encounter for other orthopedic aftercare: Secondary | ICD-10-CM | POA: Diagnosis not present

## 2015-10-25 DIAGNOSIS — R339 Retention of urine, unspecified: Secondary | ICD-10-CM | POA: Diagnosis not present

## 2015-10-30 DIAGNOSIS — R339 Retention of urine, unspecified: Secondary | ICD-10-CM | POA: Diagnosis not present

## 2015-10-31 DIAGNOSIS — I129 Hypertensive chronic kidney disease with stage 1 through stage 4 chronic kidney disease, or unspecified chronic kidney disease: Secondary | ICD-10-CM | POA: Diagnosis not present

## 2015-10-31 DIAGNOSIS — R269 Unspecified abnormalities of gait and mobility: Secondary | ICD-10-CM | POA: Diagnosis not present

## 2015-10-31 DIAGNOSIS — N189 Chronic kidney disease, unspecified: Secondary | ICD-10-CM | POA: Diagnosis not present

## 2015-10-31 DIAGNOSIS — Z4789 Encounter for other orthopedic aftercare: Secondary | ICD-10-CM | POA: Diagnosis not present

## 2015-11-01 DIAGNOSIS — Z4789 Encounter for other orthopedic aftercare: Secondary | ICD-10-CM | POA: Diagnosis not present

## 2015-11-01 DIAGNOSIS — N189 Chronic kidney disease, unspecified: Secondary | ICD-10-CM | POA: Diagnosis not present

## 2015-11-01 DIAGNOSIS — I129 Hypertensive chronic kidney disease with stage 1 through stage 4 chronic kidney disease, or unspecified chronic kidney disease: Secondary | ICD-10-CM | POA: Diagnosis not present

## 2015-11-01 DIAGNOSIS — R269 Unspecified abnormalities of gait and mobility: Secondary | ICD-10-CM | POA: Diagnosis not present

## 2015-11-06 DIAGNOSIS — R269 Unspecified abnormalities of gait and mobility: Secondary | ICD-10-CM | POA: Diagnosis not present

## 2015-11-06 DIAGNOSIS — Z4789 Encounter for other orthopedic aftercare: Secondary | ICD-10-CM | POA: Diagnosis not present

## 2015-11-06 DIAGNOSIS — N189 Chronic kidney disease, unspecified: Secondary | ICD-10-CM | POA: Diagnosis not present

## 2015-11-06 DIAGNOSIS — I129 Hypertensive chronic kidney disease with stage 1 through stage 4 chronic kidney disease, or unspecified chronic kidney disease: Secondary | ICD-10-CM | POA: Diagnosis not present

## 2015-11-09 DIAGNOSIS — N189 Chronic kidney disease, unspecified: Secondary | ICD-10-CM | POA: Diagnosis not present

## 2015-11-09 DIAGNOSIS — Z4789 Encounter for other orthopedic aftercare: Secondary | ICD-10-CM | POA: Diagnosis not present

## 2015-11-09 DIAGNOSIS — I129 Hypertensive chronic kidney disease with stage 1 through stage 4 chronic kidney disease, or unspecified chronic kidney disease: Secondary | ICD-10-CM | POA: Diagnosis not present

## 2015-11-09 DIAGNOSIS — R269 Unspecified abnormalities of gait and mobility: Secondary | ICD-10-CM | POA: Diagnosis not present

## 2015-11-11 DIAGNOSIS — Z881 Allergy status to other antibiotic agents status: Secondary | ICD-10-CM | POA: Diagnosis not present

## 2015-11-11 DIAGNOSIS — Z79899 Other long term (current) drug therapy: Secondary | ICD-10-CM | POA: Diagnosis not present

## 2015-11-11 DIAGNOSIS — Z888 Allergy status to other drugs, medicaments and biological substances status: Secondary | ICD-10-CM | POA: Diagnosis not present

## 2015-11-11 DIAGNOSIS — R319 Hematuria, unspecified: Secondary | ICD-10-CM | POA: Diagnosis not present

## 2015-11-11 DIAGNOSIS — Z885 Allergy status to narcotic agent status: Secondary | ICD-10-CM | POA: Diagnosis not present

## 2015-11-11 DIAGNOSIS — R339 Retention of urine, unspecified: Secondary | ICD-10-CM | POA: Diagnosis not present

## 2015-11-11 DIAGNOSIS — Z7902 Long term (current) use of antithrombotics/antiplatelets: Secondary | ICD-10-CM | POA: Diagnosis not present

## 2015-11-11 DIAGNOSIS — N401 Enlarged prostate with lower urinary tract symptoms: Secondary | ICD-10-CM | POA: Diagnosis not present

## 2015-11-11 DIAGNOSIS — N4889 Other specified disorders of penis: Secondary | ICD-10-CM | POA: Diagnosis not present

## 2015-11-11 DIAGNOSIS — I10 Essential (primary) hypertension: Secondary | ICD-10-CM | POA: Diagnosis not present

## 2015-11-11 DIAGNOSIS — Z7982 Long term (current) use of aspirin: Secondary | ICD-10-CM | POA: Diagnosis not present

## 2015-11-11 DIAGNOSIS — R310 Gross hematuria: Secondary | ICD-10-CM | POA: Diagnosis not present

## 2015-11-11 DIAGNOSIS — Z466 Encounter for fitting and adjustment of urinary device: Secondary | ICD-10-CM | POA: Diagnosis not present

## 2015-11-12 DIAGNOSIS — R42 Dizziness and giddiness: Secondary | ICD-10-CM | POA: Diagnosis not present

## 2015-11-12 DIAGNOSIS — Z885 Allergy status to narcotic agent status: Secondary | ICD-10-CM | POA: Diagnosis not present

## 2015-11-12 DIAGNOSIS — Z9889 Other specified postprocedural states: Secondary | ICD-10-CM | POA: Diagnosis not present

## 2015-11-12 DIAGNOSIS — Z905 Acquired absence of kidney: Secondary | ICD-10-CM | POA: Diagnosis not present

## 2015-11-12 DIAGNOSIS — Z881 Allergy status to other antibiotic agents status: Secondary | ICD-10-CM | POA: Diagnosis not present

## 2015-11-12 DIAGNOSIS — Z87442 Personal history of urinary calculi: Secondary | ICD-10-CM | POA: Diagnosis not present

## 2015-11-12 DIAGNOSIS — Z85528 Personal history of other malignant neoplasm of kidney: Secondary | ICD-10-CM | POA: Diagnosis not present

## 2015-11-12 DIAGNOSIS — R109 Unspecified abdominal pain: Secondary | ICD-10-CM | POA: Diagnosis not present

## 2015-11-12 DIAGNOSIS — Z7982 Long term (current) use of aspirin: Secondary | ICD-10-CM | POA: Diagnosis not present

## 2015-11-12 DIAGNOSIS — I10 Essential (primary) hypertension: Secondary | ICD-10-CM | POA: Diagnosis not present

## 2015-11-12 DIAGNOSIS — N368 Other specified disorders of urethra: Secondary | ICD-10-CM | POA: Diagnosis not present

## 2015-11-12 DIAGNOSIS — R319 Hematuria, unspecified: Secondary | ICD-10-CM | POA: Diagnosis not present

## 2015-11-13 DIAGNOSIS — Z7951 Long term (current) use of inhaled steroids: Secondary | ICD-10-CM | POA: Diagnosis not present

## 2015-11-13 DIAGNOSIS — N3080 Other cystitis without hematuria: Secondary | ICD-10-CM | POA: Diagnosis not present

## 2015-11-13 DIAGNOSIS — Z888 Allergy status to other drugs, medicaments and biological substances status: Secondary | ICD-10-CM | POA: Diagnosis not present

## 2015-11-13 DIAGNOSIS — R310 Gross hematuria: Secondary | ICD-10-CM | POA: Diagnosis not present

## 2015-11-13 DIAGNOSIS — S3739XA Other injury of urethra, initial encounter: Secondary | ICD-10-CM | POA: Diagnosis not present

## 2015-11-13 DIAGNOSIS — Z87442 Personal history of urinary calculi: Secondary | ICD-10-CM | POA: Diagnosis not present

## 2015-11-13 DIAGNOSIS — F17210 Nicotine dependence, cigarettes, uncomplicated: Secondary | ICD-10-CM | POA: Diagnosis not present

## 2015-11-13 DIAGNOSIS — J45909 Unspecified asthma, uncomplicated: Secondary | ICD-10-CM | POA: Diagnosis not present

## 2015-11-13 DIAGNOSIS — I10 Essential (primary) hypertension: Secondary | ICD-10-CM | POA: Diagnosis not present

## 2015-11-13 DIAGNOSIS — R351 Nocturia: Secondary | ICD-10-CM | POA: Diagnosis not present

## 2015-11-13 DIAGNOSIS — N138 Other obstructive and reflux uropathy: Secondary | ICD-10-CM | POA: Diagnosis not present

## 2015-11-13 DIAGNOSIS — R3914 Feeling of incomplete bladder emptying: Secondary | ICD-10-CM | POA: Diagnosis not present

## 2015-11-13 DIAGNOSIS — Z79899 Other long term (current) drug therapy: Secondary | ICD-10-CM | POA: Diagnosis not present

## 2015-11-13 DIAGNOSIS — Z85528 Personal history of other malignant neoplasm of kidney: Secondary | ICD-10-CM | POA: Diagnosis not present

## 2015-11-13 DIAGNOSIS — R339 Retention of urine, unspecified: Secondary | ICD-10-CM | POA: Diagnosis not present

## 2015-11-13 DIAGNOSIS — M542 Cervicalgia: Secondary | ICD-10-CM | POA: Diagnosis not present

## 2015-11-13 DIAGNOSIS — Z880 Allergy status to penicillin: Secondary | ICD-10-CM | POA: Diagnosis not present

## 2015-11-13 DIAGNOSIS — Z905 Acquired absence of kidney: Secondary | ICD-10-CM | POA: Diagnosis not present

## 2015-11-13 DIAGNOSIS — Y846 Urinary catheterization as the cause of abnormal reaction of the patient, or of later complication, without mention of misadventure at the time of the procedure: Secondary | ICD-10-CM | POA: Diagnosis not present

## 2015-11-13 DIAGNOSIS — R338 Other retention of urine: Secondary | ICD-10-CM | POA: Diagnosis not present

## 2015-11-13 DIAGNOSIS — I499 Cardiac arrhythmia, unspecified: Secondary | ICD-10-CM | POA: Diagnosis not present

## 2015-11-13 DIAGNOSIS — M549 Dorsalgia, unspecified: Secondary | ICD-10-CM | POA: Diagnosis not present

## 2015-11-13 DIAGNOSIS — R319 Hematuria, unspecified: Secondary | ICD-10-CM | POA: Diagnosis not present

## 2015-11-13 DIAGNOSIS — N401 Enlarged prostate with lower urinary tract symptoms: Secondary | ICD-10-CM | POA: Diagnosis not present

## 2015-11-13 DIAGNOSIS — Z885 Allergy status to narcotic agent status: Secondary | ICD-10-CM | POA: Diagnosis not present

## 2015-11-13 DIAGNOSIS — D649 Anemia, unspecified: Secondary | ICD-10-CM | POA: Diagnosis not present

## 2015-11-13 DIAGNOSIS — M545 Low back pain: Secondary | ICD-10-CM | POA: Diagnosis not present

## 2015-11-14 DIAGNOSIS — R310 Gross hematuria: Secondary | ICD-10-CM | POA: Diagnosis not present

## 2015-11-15 DIAGNOSIS — M542 Cervicalgia: Secondary | ICD-10-CM | POA: Insufficient documentation

## 2015-11-17 DIAGNOSIS — R339 Retention of urine, unspecified: Secondary | ICD-10-CM | POA: Diagnosis not present

## 2015-11-20 DIAGNOSIS — R899 Unspecified abnormal finding in specimens from other organs, systems and tissues: Secondary | ICD-10-CM | POA: Diagnosis not present

## 2015-11-20 DIAGNOSIS — D649 Anemia, unspecified: Secondary | ICD-10-CM | POA: Diagnosis not present

## 2015-11-20 DIAGNOSIS — R42 Dizziness and giddiness: Secondary | ICD-10-CM | POA: Diagnosis not present

## 2015-11-20 DIAGNOSIS — I10 Essential (primary) hypertension: Secondary | ICD-10-CM | POA: Diagnosis not present

## 2015-11-27 DIAGNOSIS — J019 Acute sinusitis, unspecified: Secondary | ICD-10-CM | POA: Diagnosis not present

## 2015-11-27 DIAGNOSIS — R05 Cough: Secondary | ICD-10-CM | POA: Diagnosis not present

## 2015-11-27 DIAGNOSIS — B9689 Other specified bacterial agents as the cause of diseases classified elsewhere: Secondary | ICD-10-CM | POA: Diagnosis not present

## 2015-12-12 DIAGNOSIS — J410 Simple chronic bronchitis: Secondary | ICD-10-CM | POA: Diagnosis not present

## 2015-12-12 DIAGNOSIS — R001 Bradycardia, unspecified: Secondary | ICD-10-CM | POA: Diagnosis not present

## 2015-12-12 DIAGNOSIS — I480 Paroxysmal atrial fibrillation: Secondary | ICD-10-CM | POA: Diagnosis not present

## 2015-12-12 DIAGNOSIS — I10 Essential (primary) hypertension: Secondary | ICD-10-CM | POA: Diagnosis not present

## 2015-12-12 DIAGNOSIS — E7800 Pure hypercholesterolemia, unspecified: Secondary | ICD-10-CM | POA: Diagnosis not present

## 2015-12-18 DIAGNOSIS — N401 Enlarged prostate with lower urinary tract symptoms: Secondary | ICD-10-CM | POA: Diagnosis not present

## 2015-12-18 DIAGNOSIS — N138 Other obstructive and reflux uropathy: Secondary | ICD-10-CM | POA: Diagnosis not present

## 2015-12-18 DIAGNOSIS — R339 Retention of urine, unspecified: Secondary | ICD-10-CM | POA: Diagnosis not present

## 2015-12-18 DIAGNOSIS — N390 Urinary tract infection, site not specified: Secondary | ICD-10-CM | POA: Diagnosis not present

## 2015-12-18 DIAGNOSIS — N200 Calculus of kidney: Secondary | ICD-10-CM | POA: Diagnosis not present

## 2015-12-20 DIAGNOSIS — E611 Iron deficiency: Secondary | ICD-10-CM | POA: Diagnosis not present

## 2015-12-20 DIAGNOSIS — D649 Anemia, unspecified: Secondary | ICD-10-CM | POA: Diagnosis not present

## 2016-01-04 DIAGNOSIS — J441 Chronic obstructive pulmonary disease with (acute) exacerbation: Secondary | ICD-10-CM | POA: Diagnosis not present

## 2016-01-04 DIAGNOSIS — F17200 Nicotine dependence, unspecified, uncomplicated: Secondary | ICD-10-CM | POA: Diagnosis not present

## 2016-01-18 DIAGNOSIS — R413 Other amnesia: Secondary | ICD-10-CM | POA: Diagnosis not present

## 2016-01-18 DIAGNOSIS — R0602 Shortness of breath: Secondary | ICD-10-CM | POA: Diagnosis not present

## 2016-01-18 DIAGNOSIS — E538 Deficiency of other specified B group vitamins: Secondary | ICD-10-CM | POA: Diagnosis not present

## 2016-01-18 DIAGNOSIS — R634 Abnormal weight loss: Secondary | ICD-10-CM | POA: Diagnosis not present

## 2016-01-18 DIAGNOSIS — R531 Weakness: Secondary | ICD-10-CM | POA: Diagnosis not present

## 2016-01-18 DIAGNOSIS — J440 Chronic obstructive pulmonary disease with acute lower respiratory infection: Secondary | ICD-10-CM | POA: Diagnosis not present

## 2016-01-19 DIAGNOSIS — J439 Emphysema, unspecified: Secondary | ICD-10-CM | POA: Diagnosis not present

## 2016-01-19 DIAGNOSIS — R0602 Shortness of breath: Secondary | ICD-10-CM | POA: Diagnosis not present

## 2016-01-24 ENCOUNTER — Other Ambulatory Visit: Payer: Self-pay | Admitting: Specialist

## 2016-01-24 DIAGNOSIS — R0602 Shortness of breath: Secondary | ICD-10-CM | POA: Diagnosis not present

## 2016-01-24 DIAGNOSIS — R634 Abnormal weight loss: Secondary | ICD-10-CM

## 2016-01-24 DIAGNOSIS — Z8551 Personal history of malignant neoplasm of bladder: Secondary | ICD-10-CM

## 2016-01-31 ENCOUNTER — Ambulatory Visit
Admission: RE | Admit: 2016-01-31 | Discharge: 2016-01-31 | Disposition: A | Discharge status: Home / Self Care | Payer: PPO | Source: Ambulatory Visit | Attending: Specialist | Admitting: Specialist

## 2016-01-31 DIAGNOSIS — Z9049 Acquired absence of other specified parts of digestive tract: Secondary | ICD-10-CM | POA: Diagnosis not present

## 2016-01-31 DIAGNOSIS — Z8551 Personal history of malignant neoplasm of bladder: Secondary | ICD-10-CM | POA: Diagnosis not present

## 2016-01-31 DIAGNOSIS — Z905 Acquired absence of kidney: Secondary | ICD-10-CM | POA: Insufficient documentation

## 2016-01-31 DIAGNOSIS — N200 Calculus of kidney: Secondary | ICD-10-CM | POA: Diagnosis not present

## 2016-01-31 DIAGNOSIS — R05 Cough: Secondary | ICD-10-CM | POA: Diagnosis not present

## 2016-01-31 DIAGNOSIS — R634 Abnormal weight loss: Secondary | ICD-10-CM | POA: Insufficient documentation

## 2016-01-31 MED ORDER — IOPAMIDOL (ISOVUE-300) INJECTION 61%
100.0000 mL | Freq: Once | INTRAVENOUS | Status: AC | PRN
  Administered 2016-01-31: 100 mL via INTRAVENOUS

## 2016-02-01 DIAGNOSIS — E538 Deficiency of other specified B group vitamins: Secondary | ICD-10-CM | POA: Diagnosis not present

## 2016-02-05 DIAGNOSIS — R05 Cough: Secondary | ICD-10-CM | POA: Diagnosis not present

## 2016-02-05 DIAGNOSIS — J449 Chronic obstructive pulmonary disease, unspecified: Secondary | ICD-10-CM | POA: Diagnosis not present

## 2016-02-08 DIAGNOSIS — E538 Deficiency of other specified B group vitamins: Secondary | ICD-10-CM | POA: Diagnosis not present

## 2016-02-15 DIAGNOSIS — E538 Deficiency of other specified B group vitamins: Secondary | ICD-10-CM | POA: Diagnosis not present

## 2016-02-22 DIAGNOSIS — E538 Deficiency of other specified B group vitamins: Secondary | ICD-10-CM | POA: Diagnosis not present

## 2016-02-26 DIAGNOSIS — J439 Emphysema, unspecified: Secondary | ICD-10-CM | POA: Diagnosis not present

## 2016-02-26 DIAGNOSIS — J310 Chronic rhinitis: Secondary | ICD-10-CM | POA: Diagnosis not present

## 2016-02-26 DIAGNOSIS — N2889 Other specified disorders of kidney and ureter: Secondary | ICD-10-CM | POA: Diagnosis not present

## 2016-02-26 DIAGNOSIS — R05 Cough: Secondary | ICD-10-CM | POA: Diagnosis not present

## 2016-03-12 DIAGNOSIS — M545 Low back pain: Secondary | ICD-10-CM | POA: Diagnosis not present

## 2016-03-12 DIAGNOSIS — I10 Essential (primary) hypertension: Secondary | ICD-10-CM | POA: Diagnosis not present

## 2016-03-20 DIAGNOSIS — Z86010 Personal history of colonic polyps: Secondary | ICD-10-CM | POA: Diagnosis not present

## 2016-03-20 DIAGNOSIS — D649 Anemia, unspecified: Secondary | ICD-10-CM | POA: Diagnosis not present

## 2016-03-20 DIAGNOSIS — M545 Low back pain: Secondary | ICD-10-CM | POA: Diagnosis not present

## 2016-03-27 DIAGNOSIS — M545 Low back pain: Secondary | ICD-10-CM | POA: Diagnosis not present

## 2016-04-01 DIAGNOSIS — M545 Low back pain: Secondary | ICD-10-CM | POA: Diagnosis not present

## 2016-04-03 DIAGNOSIS — H35371 Puckering of macula, right eye: Secondary | ICD-10-CM | POA: Diagnosis not present

## 2016-04-08 DIAGNOSIS — F0280 Dementia in other diseases classified elsewhere without behavioral disturbance: Secondary | ICD-10-CM | POA: Diagnosis not present

## 2016-04-08 DIAGNOSIS — R259 Unspecified abnormal involuntary movements: Secondary | ICD-10-CM | POA: Diagnosis not present

## 2016-04-08 DIAGNOSIS — G308 Other Alzheimer's disease: Secondary | ICD-10-CM | POA: Diagnosis not present

## 2016-04-18 DIAGNOSIS — F0280 Dementia in other diseases classified elsewhere without behavioral disturbance: Secondary | ICD-10-CM | POA: Insufficient documentation

## 2016-04-18 DIAGNOSIS — G309 Alzheimer's disease, unspecified: Secondary | ICD-10-CM

## 2016-04-22 ENCOUNTER — Other Ambulatory Visit: Payer: Self-pay | Admitting: Neurology

## 2016-04-22 DIAGNOSIS — R413 Other amnesia: Secondary | ICD-10-CM

## 2016-04-29 DIAGNOSIS — R339 Retention of urine, unspecified: Secondary | ICD-10-CM | POA: Diagnosis not present

## 2016-04-29 DIAGNOSIS — C649 Malignant neoplasm of unspecified kidney, except renal pelvis: Secondary | ICD-10-CM | POA: Diagnosis not present

## 2016-04-29 DIAGNOSIS — N138 Other obstructive and reflux uropathy: Secondary | ICD-10-CM | POA: Diagnosis not present

## 2016-04-29 DIAGNOSIS — R972 Elevated prostate specific antigen [PSA]: Secondary | ICD-10-CM | POA: Diagnosis not present

## 2016-04-29 DIAGNOSIS — N401 Enlarged prostate with lower urinary tract symptoms: Secondary | ICD-10-CM | POA: Diagnosis not present

## 2016-05-02 ENCOUNTER — Ambulatory Visit
Admission: RE | Admit: 2016-05-02 | Discharge: 2016-05-02 | Disposition: A | Discharge status: Home / Self Care | Payer: PPO | Source: Ambulatory Visit | Attending: Neurology | Admitting: Neurology

## 2016-05-02 ENCOUNTER — Other Ambulatory Visit
Admission: RE | Admit: 2016-05-02 | Discharge: 2016-05-02 | Disposition: A | Discharge status: Home / Self Care | Payer: PPO | Source: Ambulatory Visit | Attending: Neurology | Admitting: Neurology

## 2016-05-02 DIAGNOSIS — R413 Other amnesia: Secondary | ICD-10-CM | POA: Insufficient documentation

## 2016-05-02 DIAGNOSIS — D320 Benign neoplasm of cerebral meninges: Secondary | ICD-10-CM | POA: Diagnosis not present

## 2016-05-02 DIAGNOSIS — Z79899 Other long term (current) drug therapy: Secondary | ICD-10-CM | POA: Insufficient documentation

## 2016-05-02 DIAGNOSIS — D329 Benign neoplasm of meninges, unspecified: Secondary | ICD-10-CM | POA: Diagnosis not present

## 2016-05-02 LAB — BUN: BUN: 17 mg/dL (ref 6–20)

## 2016-05-02 LAB — CREATININE, SERUM
CREATININE: 1.33 mg/dL — AB (ref 0.61–1.24)
GFR calc Af Amer: 58 mL/min — ABNORMAL LOW (ref >60)
GFR calc non Af Amer: 50 mL/min — ABNORMAL LOW (ref >60)

## 2016-05-02 MED ORDER — GADOBENATE DIMEGLUMINE 529 MG/ML IV SOLN
15.0000 mL | Freq: Once | INTRAVENOUS | Status: AC | PRN
  Administered 2016-05-02: 14 mL via INTRAVENOUS

## 2016-05-04 DIAGNOSIS — C649 Malignant neoplasm of unspecified kidney, except renal pelvis: Secondary | ICD-10-CM

## 2016-05-04 HISTORY — DX: Malignant neoplasm of unspecified kidney, except renal pelvis: C64.9

## 2016-05-06 DIAGNOSIS — E7800 Pure hypercholesterolemia, unspecified: Secondary | ICD-10-CM | POA: Diagnosis not present

## 2016-05-06 DIAGNOSIS — D2271 Melanocytic nevi of right lower limb, including hip: Secondary | ICD-10-CM | POA: Diagnosis not present

## 2016-05-06 DIAGNOSIS — C44319 Basal cell carcinoma of skin of other parts of face: Secondary | ICD-10-CM | POA: Diagnosis not present

## 2016-05-06 DIAGNOSIS — R001 Bradycardia, unspecified: Secondary | ICD-10-CM | POA: Diagnosis not present

## 2016-05-06 DIAGNOSIS — I480 Paroxysmal atrial fibrillation: Secondary | ICD-10-CM | POA: Diagnosis not present

## 2016-05-06 DIAGNOSIS — D225 Melanocytic nevi of trunk: Secondary | ICD-10-CM | POA: Diagnosis not present

## 2016-05-06 DIAGNOSIS — D2261 Melanocytic nevi of right upper limb, including shoulder: Secondary | ICD-10-CM | POA: Diagnosis not present

## 2016-05-06 DIAGNOSIS — J410 Simple chronic bronchitis: Secondary | ICD-10-CM | POA: Diagnosis not present

## 2016-05-06 DIAGNOSIS — D2272 Melanocytic nevi of left lower limb, including hip: Secondary | ICD-10-CM | POA: Diagnosis not present

## 2016-05-06 DIAGNOSIS — D485 Neoplasm of uncertain behavior of skin: Secondary | ICD-10-CM | POA: Diagnosis not present

## 2016-05-07 DIAGNOSIS — R259 Unspecified abnormal involuntary movements: Secondary | ICD-10-CM | POA: Diagnosis not present

## 2016-05-07 DIAGNOSIS — G308 Other Alzheimer's disease: Secondary | ICD-10-CM | POA: Diagnosis not present

## 2016-05-07 DIAGNOSIS — D329 Benign neoplasm of meninges, unspecified: Secondary | ICD-10-CM | POA: Diagnosis not present

## 2016-05-07 DIAGNOSIS — F0280 Dementia in other diseases classified elsewhere without behavioral disturbance: Secondary | ICD-10-CM | POA: Diagnosis not present

## 2016-05-08 DIAGNOSIS — C44310 Basal cell carcinoma of skin of unspecified parts of face: Secondary | ICD-10-CM | POA: Diagnosis not present

## 2016-05-09 DIAGNOSIS — J440 Chronic obstructive pulmonary disease with acute lower respiratory infection: Secondary | ICD-10-CM | POA: Diagnosis not present

## 2016-05-09 DIAGNOSIS — J209 Acute bronchitis, unspecified: Secondary | ICD-10-CM | POA: Diagnosis not present

## 2016-05-09 DIAGNOSIS — C44310 Basal cell carcinoma of skin of unspecified parts of face: Secondary | ICD-10-CM | POA: Diagnosis not present

## 2016-05-09 DIAGNOSIS — L728 Other follicular cysts of the skin and subcutaneous tissue: Secondary | ICD-10-CM | POA: Diagnosis not present

## 2016-06-04 DIAGNOSIS — M545 Low back pain: Secondary | ICD-10-CM | POA: Diagnosis not present

## 2016-06-04 DIAGNOSIS — D329 Benign neoplasm of meninges, unspecified: Secondary | ICD-10-CM | POA: Diagnosis not present

## 2016-06-12 DIAGNOSIS — D329 Benign neoplasm of meninges, unspecified: Secondary | ICD-10-CM | POA: Diagnosis not present

## 2016-06-20 DIAGNOSIS — Z01818 Encounter for other preprocedural examination: Secondary | ICD-10-CM | POA: Diagnosis not present

## 2016-06-24 DIAGNOSIS — J439 Emphysema, unspecified: Secondary | ICD-10-CM | POA: Diagnosis not present

## 2016-06-24 DIAGNOSIS — R0609 Other forms of dyspnea: Secondary | ICD-10-CM | POA: Diagnosis not present

## 2016-06-24 DIAGNOSIS — R001 Bradycardia, unspecified: Secondary | ICD-10-CM | POA: Diagnosis not present

## 2016-06-24 DIAGNOSIS — I10 Essential (primary) hypertension: Secondary | ICD-10-CM | POA: Diagnosis not present

## 2016-06-24 DIAGNOSIS — J410 Simple chronic bronchitis: Secondary | ICD-10-CM | POA: Diagnosis not present

## 2016-06-24 DIAGNOSIS — E7800 Pure hypercholesterolemia, unspecified: Secondary | ICD-10-CM | POA: Diagnosis not present

## 2016-06-24 DIAGNOSIS — I480 Paroxysmal atrial fibrillation: Secondary | ICD-10-CM | POA: Diagnosis not present

## 2016-06-24 DIAGNOSIS — Z01818 Encounter for other preprocedural examination: Secondary | ICD-10-CM | POA: Diagnosis not present

## 2016-06-28 DIAGNOSIS — G300 Alzheimer's disease with early onset: Secondary | ICD-10-CM | POA: Diagnosis not present

## 2016-06-28 DIAGNOSIS — I480 Paroxysmal atrial fibrillation: Secondary | ICD-10-CM | POA: Diagnosis not present

## 2016-06-28 DIAGNOSIS — D329 Benign neoplasm of meninges, unspecified: Secondary | ICD-10-CM | POA: Diagnosis not present

## 2016-06-28 DIAGNOSIS — C649 Malignant neoplasm of unspecified kidney, except renal pelvis: Secondary | ICD-10-CM | POA: Diagnosis not present

## 2016-06-28 DIAGNOSIS — I10 Essential (primary) hypertension: Secondary | ICD-10-CM | POA: Diagnosis not present

## 2016-06-28 DIAGNOSIS — N401 Enlarged prostate with lower urinary tract symptoms: Secondary | ICD-10-CM | POA: Diagnosis not present

## 2016-06-28 DIAGNOSIS — F0280 Dementia in other diseases classified elsewhere without behavioral disturbance: Secondary | ICD-10-CM | POA: Diagnosis not present

## 2016-06-28 DIAGNOSIS — R972 Elevated prostate specific antigen [PSA]: Secondary | ICD-10-CM | POA: Diagnosis not present

## 2016-07-04 DIAGNOSIS — D329 Benign neoplasm of meninges, unspecified: Secondary | ICD-10-CM | POA: Diagnosis not present

## 2016-07-04 DIAGNOSIS — Z905 Acquired absence of kidney: Secondary | ICD-10-CM | POA: Diagnosis not present

## 2016-07-04 DIAGNOSIS — I129 Hypertensive chronic kidney disease with stage 1 through stage 4 chronic kidney disease, or unspecified chronic kidney disease: Secondary | ICD-10-CM | POA: Diagnosis not present

## 2016-07-04 DIAGNOSIS — G936 Cerebral edema: Secondary | ICD-10-CM | POA: Diagnosis not present

## 2016-07-04 DIAGNOSIS — E785 Hyperlipidemia, unspecified: Secondary | ICD-10-CM | POA: Diagnosis not present

## 2016-07-04 DIAGNOSIS — D420 Neoplasm of uncertain behavior of cerebral meninges: Secondary | ICD-10-CM | POA: Diagnosis not present

## 2016-07-04 DIAGNOSIS — D320 Benign neoplasm of cerebral meninges: Secondary | ICD-10-CM | POA: Diagnosis not present

## 2016-07-04 DIAGNOSIS — N189 Chronic kidney disease, unspecified: Secondary | ICD-10-CM | POA: Diagnosis not present

## 2016-07-04 DIAGNOSIS — F17210 Nicotine dependence, cigarettes, uncomplicated: Secondary | ICD-10-CM | POA: Diagnosis not present

## 2016-07-04 DIAGNOSIS — Z86010 Personal history of colonic polyps: Secondary | ICD-10-CM | POA: Diagnosis not present

## 2016-07-04 DIAGNOSIS — I480 Paroxysmal atrial fibrillation: Secondary | ICD-10-CM | POA: Diagnosis not present

## 2016-07-04 DIAGNOSIS — K219 Gastro-esophageal reflux disease without esophagitis: Secondary | ICD-10-CM | POA: Diagnosis not present

## 2016-07-04 DIAGNOSIS — Z85528 Personal history of other malignant neoplasm of kidney: Secondary | ICD-10-CM | POA: Diagnosis not present

## 2016-07-04 DIAGNOSIS — J449 Chronic obstructive pulmonary disease, unspecified: Secondary | ICD-10-CM | POA: Diagnosis not present

## 2016-07-05 DIAGNOSIS — D329 Benign neoplasm of meninges, unspecified: Secondary | ICD-10-CM | POA: Diagnosis not present

## 2016-07-05 DIAGNOSIS — D320 Benign neoplasm of cerebral meninges: Secondary | ICD-10-CM | POA: Diagnosis not present

## 2016-07-05 DIAGNOSIS — Z9889 Other specified postprocedural states: Secondary | ICD-10-CM | POA: Diagnosis not present

## 2016-07-05 DIAGNOSIS — H5704 Mydriasis: Secondary | ICD-10-CM | POA: Diagnosis not present

## 2016-07-11 DIAGNOSIS — Z4802 Encounter for removal of sutures: Secondary | ICD-10-CM | POA: Diagnosis not present

## 2016-07-16 DIAGNOSIS — D329 Benign neoplasm of meninges, unspecified: Secondary | ICD-10-CM | POA: Diagnosis not present

## 2016-07-16 DIAGNOSIS — Z79899 Other long term (current) drug therapy: Secondary | ICD-10-CM | POA: Diagnosis not present

## 2016-07-16 DIAGNOSIS — R531 Weakness: Secondary | ICD-10-CM | POA: Diagnosis not present

## 2016-07-16 DIAGNOSIS — E871 Hypo-osmolality and hyponatremia: Secondary | ICD-10-CM | POA: Diagnosis not present

## 2016-07-16 DIAGNOSIS — N401 Enlarged prostate with lower urinary tract symptoms: Secondary | ICD-10-CM | POA: Diagnosis not present

## 2016-07-16 DIAGNOSIS — Z86010 Personal history of colonic polyps: Secondary | ICD-10-CM | POA: Diagnosis not present

## 2016-07-16 DIAGNOSIS — N138 Other obstructive and reflux uropathy: Secondary | ICD-10-CM | POA: Diagnosis not present

## 2016-07-16 DIAGNOSIS — N400 Enlarged prostate without lower urinary tract symptoms: Secondary | ICD-10-CM | POA: Diagnosis not present

## 2016-07-16 DIAGNOSIS — K219 Gastro-esophageal reflux disease without esophagitis: Secondary | ICD-10-CM | POA: Diagnosis not present

## 2016-07-16 DIAGNOSIS — G9389 Other specified disorders of brain: Secondary | ICD-10-CM | POA: Diagnosis not present

## 2016-07-16 DIAGNOSIS — D320 Benign neoplasm of cerebral meninges: Secondary | ICD-10-CM | POA: Diagnosis not present

## 2016-07-16 DIAGNOSIS — R6889 Other general symptoms and signs: Secondary | ICD-10-CM | POA: Diagnosis not present

## 2016-07-16 DIAGNOSIS — A419 Sepsis, unspecified organism: Secondary | ICD-10-CM | POA: Diagnosis not present

## 2016-07-16 DIAGNOSIS — N329 Bladder disorder, unspecified: Secondary | ICD-10-CM | POA: Diagnosis not present

## 2016-07-16 DIAGNOSIS — F0280 Dementia in other diseases classified elsewhere without behavioral disturbance: Secondary | ICD-10-CM | POA: Diagnosis not present

## 2016-07-16 DIAGNOSIS — I129 Hypertensive chronic kidney disease with stage 1 through stage 4 chronic kidney disease, or unspecified chronic kidney disease: Secondary | ICD-10-CM | POA: Diagnosis not present

## 2016-07-16 DIAGNOSIS — N189 Chronic kidney disease, unspecified: Secondary | ICD-10-CM | POA: Diagnosis not present

## 2016-07-16 DIAGNOSIS — A4152 Sepsis due to Pseudomonas: Secondary | ICD-10-CM | POA: Diagnosis not present

## 2016-07-16 DIAGNOSIS — F05 Delirium due to known physiological condition: Secondary | ICD-10-CM | POA: Diagnosis not present

## 2016-07-16 DIAGNOSIS — I480 Paroxysmal atrial fibrillation: Secondary | ICD-10-CM | POA: Diagnosis not present

## 2016-07-16 DIAGNOSIS — Z86011 Personal history of benign neoplasm of the brain: Secondary | ICD-10-CM | POA: Diagnosis not present

## 2016-07-16 DIAGNOSIS — F17200 Nicotine dependence, unspecified, uncomplicated: Secondary | ICD-10-CM | POA: Diagnosis not present

## 2016-07-16 DIAGNOSIS — Z87442 Personal history of urinary calculi: Secondary | ICD-10-CM | POA: Diagnosis not present

## 2016-07-16 DIAGNOSIS — Z85528 Personal history of other malignant neoplasm of kidney: Secondary | ICD-10-CM | POA: Diagnosis not present

## 2016-07-16 DIAGNOSIS — R918 Other nonspecific abnormal finding of lung field: Secondary | ICD-10-CM | POA: Diagnosis not present

## 2016-07-16 DIAGNOSIS — J449 Chronic obstructive pulmonary disease, unspecified: Secondary | ICD-10-CM | POA: Diagnosis not present

## 2016-07-16 DIAGNOSIS — R4182 Altered mental status, unspecified: Secondary | ICD-10-CM | POA: Diagnosis not present

## 2016-07-16 DIAGNOSIS — F17209 Nicotine dependence, unspecified, with unspecified nicotine-induced disorders: Secondary | ICD-10-CM | POA: Diagnosis not present

## 2016-07-16 DIAGNOSIS — N390 Urinary tract infection, site not specified: Secondary | ICD-10-CM | POA: Diagnosis not present

## 2016-07-16 DIAGNOSIS — E785 Hyperlipidemia, unspecified: Secondary | ICD-10-CM | POA: Diagnosis not present

## 2016-07-16 DIAGNOSIS — Z905 Acquired absence of kidney: Secondary | ICD-10-CM | POA: Diagnosis not present

## 2016-07-16 DIAGNOSIS — G309 Alzheimer's disease, unspecified: Secondary | ICD-10-CM | POA: Diagnosis not present

## 2016-07-17 DIAGNOSIS — N400 Enlarged prostate without lower urinary tract symptoms: Secondary | ICD-10-CM | POA: Diagnosis not present

## 2016-07-17 DIAGNOSIS — R531 Weakness: Secondary | ICD-10-CM | POA: Diagnosis not present

## 2016-07-17 DIAGNOSIS — D329 Benign neoplasm of meninges, unspecified: Secondary | ICD-10-CM | POA: Diagnosis not present

## 2016-07-18 DIAGNOSIS — N400 Enlarged prostate without lower urinary tract symptoms: Secondary | ICD-10-CM | POA: Diagnosis not present

## 2016-07-18 DIAGNOSIS — D329 Benign neoplasm of meninges, unspecified: Secondary | ICD-10-CM | POA: Diagnosis not present

## 2016-07-18 DIAGNOSIS — N390 Urinary tract infection, site not specified: Secondary | ICD-10-CM | POA: Diagnosis not present

## 2016-07-18 DIAGNOSIS — R531 Weakness: Secondary | ICD-10-CM | POA: Diagnosis not present

## 2016-07-19 DIAGNOSIS — R531 Weakness: Secondary | ICD-10-CM | POA: Diagnosis not present

## 2016-07-19 DIAGNOSIS — D329 Benign neoplasm of meninges, unspecified: Secondary | ICD-10-CM | POA: Diagnosis not present

## 2016-07-19 DIAGNOSIS — N400 Enlarged prostate without lower urinary tract symptoms: Secondary | ICD-10-CM | POA: Diagnosis not present

## 2016-07-21 DIAGNOSIS — D329 Benign neoplasm of meninges, unspecified: Secondary | ICD-10-CM | POA: Diagnosis not present

## 2016-07-21 DIAGNOSIS — M549 Dorsalgia, unspecified: Secondary | ICD-10-CM | POA: Diagnosis not present

## 2016-07-21 DIAGNOSIS — B965 Pseudomonas (aeruginosa) (mallei) (pseudomallei) as the cause of diseases classified elsewhere: Secondary | ICD-10-CM | POA: Diagnosis not present

## 2016-07-21 DIAGNOSIS — R451 Restlessness and agitation: Secondary | ICD-10-CM | POA: Diagnosis not present

## 2016-07-21 DIAGNOSIS — G92 Toxic encephalopathy: Secondary | ICD-10-CM | POA: Diagnosis not present

## 2016-07-21 DIAGNOSIS — N189 Chronic kidney disease, unspecified: Secondary | ICD-10-CM | POA: Diagnosis not present

## 2016-07-21 DIAGNOSIS — Z86011 Personal history of benign neoplasm of the brain: Secondary | ICD-10-CM | POA: Diagnosis not present

## 2016-07-21 DIAGNOSIS — T368X5A Adverse effect of other systemic antibiotics, initial encounter: Secondary | ICD-10-CM | POA: Diagnosis not present

## 2016-07-21 DIAGNOSIS — K5900 Constipation, unspecified: Secondary | ICD-10-CM | POA: Diagnosis not present

## 2016-07-21 DIAGNOSIS — G9340 Encephalopathy, unspecified: Secondary | ICD-10-CM | POA: Diagnosis not present

## 2016-07-21 DIAGNOSIS — G20 Parkinson's disease: Secondary | ICD-10-CM | POA: Diagnosis not present

## 2016-07-21 DIAGNOSIS — R259 Unspecified abnormal involuntary movements: Secondary | ICD-10-CM | POA: Diagnosis not present

## 2016-07-21 DIAGNOSIS — R918 Other nonspecific abnormal finding of lung field: Secondary | ICD-10-CM | POA: Diagnosis not present

## 2016-07-21 DIAGNOSIS — Y929 Unspecified place or not applicable: Secondary | ICD-10-CM | POA: Diagnosis not present

## 2016-07-21 DIAGNOSIS — F17210 Nicotine dependence, cigarettes, uncomplicated: Secondary | ICD-10-CM | POA: Diagnosis not present

## 2016-07-21 DIAGNOSIS — R4182 Altered mental status, unspecified: Secondary | ICD-10-CM | POA: Diagnosis not present

## 2016-07-21 DIAGNOSIS — F0280 Dementia in other diseases classified elsewhere without behavioral disturbance: Secondary | ICD-10-CM | POA: Diagnosis not present

## 2016-07-21 DIAGNOSIS — E785 Hyperlipidemia, unspecified: Secondary | ICD-10-CM | POA: Diagnosis not present

## 2016-07-21 DIAGNOSIS — Z85528 Personal history of other malignant neoplasm of kidney: Secondary | ICD-10-CM | POA: Diagnosis not present

## 2016-07-21 DIAGNOSIS — N390 Urinary tract infection, site not specified: Secondary | ICD-10-CM | POA: Diagnosis not present

## 2016-07-21 DIAGNOSIS — I480 Paroxysmal atrial fibrillation: Secondary | ICD-10-CM | POA: Diagnosis not present

## 2016-07-21 DIAGNOSIS — I4891 Unspecified atrial fibrillation: Secondary | ICD-10-CM | POA: Diagnosis not present

## 2016-07-21 DIAGNOSIS — J9811 Atelectasis: Secondary | ICD-10-CM | POA: Diagnosis not present

## 2016-07-21 DIAGNOSIS — N401 Enlarged prostate with lower urinary tract symptoms: Secondary | ICD-10-CM | POA: Diagnosis not present

## 2016-07-21 DIAGNOSIS — J449 Chronic obstructive pulmonary disease, unspecified: Secondary | ICD-10-CM | POA: Diagnosis not present

## 2016-07-21 DIAGNOSIS — Z905 Acquired absence of kidney: Secondary | ICD-10-CM | POA: Diagnosis not present

## 2016-07-21 DIAGNOSIS — I129 Hypertensive chronic kidney disease with stage 1 through stage 4 chronic kidney disease, or unspecified chronic kidney disease: Secondary | ICD-10-CM | POA: Diagnosis not present

## 2016-07-22 DIAGNOSIS — R451 Restlessness and agitation: Secondary | ICD-10-CM | POA: Insufficient documentation

## 2016-07-22 DIAGNOSIS — N390 Urinary tract infection, site not specified: Secondary | ICD-10-CM

## 2016-07-22 DIAGNOSIS — R259 Unspecified abnormal involuntary movements: Secondary | ICD-10-CM | POA: Diagnosis not present

## 2016-07-22 DIAGNOSIS — K5900 Constipation, unspecified: Secondary | ICD-10-CM | POA: Diagnosis not present

## 2016-07-22 DIAGNOSIS — B965 Pseudomonas (aeruginosa) (mallei) (pseudomallei) as the cause of diseases classified elsewhere: Secondary | ICD-10-CM | POA: Insufficient documentation

## 2016-07-22 DIAGNOSIS — G9340 Encephalopathy, unspecified: Secondary | ICD-10-CM | POA: Insufficient documentation

## 2016-07-22 DIAGNOSIS — I10 Essential (primary) hypertension: Secondary | ICD-10-CM

## 2016-07-22 HISTORY — DX: Essential (primary) hypertension: I10

## 2016-07-22 HISTORY — DX: Encephalopathy, unspecified: G93.40

## 2016-07-23 DIAGNOSIS — I10 Essential (primary) hypertension: Secondary | ICD-10-CM | POA: Diagnosis not present

## 2016-07-23 DIAGNOSIS — N390 Urinary tract infection, site not specified: Secondary | ICD-10-CM | POA: Diagnosis not present

## 2016-07-23 DIAGNOSIS — R259 Unspecified abnormal involuntary movements: Secondary | ICD-10-CM | POA: Diagnosis not present

## 2016-07-23 DIAGNOSIS — R451 Restlessness and agitation: Secondary | ICD-10-CM | POA: Diagnosis not present

## 2016-07-23 DIAGNOSIS — G9340 Encephalopathy, unspecified: Secondary | ICD-10-CM | POA: Diagnosis not present

## 2016-07-23 DIAGNOSIS — B965 Pseudomonas (aeruginosa) (mallei) (pseudomallei) as the cause of diseases classified elsewhere: Secondary | ICD-10-CM | POA: Diagnosis not present

## 2016-07-24 DIAGNOSIS — R259 Unspecified abnormal involuntary movements: Secondary | ICD-10-CM | POA: Diagnosis not present

## 2016-07-24 DIAGNOSIS — I10 Essential (primary) hypertension: Secondary | ICD-10-CM | POA: Diagnosis not present

## 2016-07-24 DIAGNOSIS — B965 Pseudomonas (aeruginosa) (mallei) (pseudomallei) as the cause of diseases classified elsewhere: Secondary | ICD-10-CM | POA: Diagnosis not present

## 2016-07-24 DIAGNOSIS — N390 Urinary tract infection, site not specified: Secondary | ICD-10-CM | POA: Diagnosis not present

## 2016-07-24 DIAGNOSIS — G9340 Encephalopathy, unspecified: Secondary | ICD-10-CM | POA: Diagnosis not present

## 2016-07-24 DIAGNOSIS — R451 Restlessness and agitation: Secondary | ICD-10-CM | POA: Diagnosis not present

## 2016-07-25 DIAGNOSIS — B965 Pseudomonas (aeruginosa) (mallei) (pseudomallei) as the cause of diseases classified elsewhere: Secondary | ICD-10-CM | POA: Diagnosis not present

## 2016-07-25 DIAGNOSIS — D329 Benign neoplasm of meninges, unspecified: Secondary | ICD-10-CM | POA: Diagnosis not present

## 2016-07-25 DIAGNOSIS — R451 Restlessness and agitation: Secondary | ICD-10-CM | POA: Diagnosis not present

## 2016-07-25 DIAGNOSIS — R410 Disorientation, unspecified: Secondary | ICD-10-CM | POA: Diagnosis not present

## 2016-07-25 DIAGNOSIS — R259 Unspecified abnormal involuntary movements: Secondary | ICD-10-CM | POA: Diagnosis not present

## 2016-07-25 DIAGNOSIS — R4182 Altered mental status, unspecified: Secondary | ICD-10-CM | POA: Diagnosis not present

## 2016-07-25 DIAGNOSIS — G9340 Encephalopathy, unspecified: Secondary | ICD-10-CM | POA: Diagnosis not present

## 2016-07-25 DIAGNOSIS — I10 Essential (primary) hypertension: Secondary | ICD-10-CM | POA: Diagnosis not present

## 2016-07-25 DIAGNOSIS — N390 Urinary tract infection, site not specified: Secondary | ICD-10-CM | POA: Diagnosis not present

## 2016-07-26 DIAGNOSIS — I10 Essential (primary) hypertension: Secondary | ICD-10-CM | POA: Diagnosis not present

## 2016-07-26 DIAGNOSIS — G9340 Encephalopathy, unspecified: Secondary | ICD-10-CM | POA: Diagnosis not present

## 2016-07-26 DIAGNOSIS — B965 Pseudomonas (aeruginosa) (mallei) (pseudomallei) as the cause of diseases classified elsewhere: Secondary | ICD-10-CM | POA: Diagnosis not present

## 2016-07-26 DIAGNOSIS — N390 Urinary tract infection, site not specified: Secondary | ICD-10-CM | POA: Diagnosis not present

## 2016-07-26 DIAGNOSIS — R451 Restlessness and agitation: Secondary | ICD-10-CM | POA: Diagnosis not present

## 2016-07-26 DIAGNOSIS — R259 Unspecified abnormal involuntary movements: Secondary | ICD-10-CM | POA: Diagnosis not present

## 2016-07-29 DIAGNOSIS — R451 Restlessness and agitation: Secondary | ICD-10-CM | POA: Diagnosis not present

## 2016-07-29 DIAGNOSIS — N390 Urinary tract infection, site not specified: Secondary | ICD-10-CM | POA: Diagnosis not present

## 2016-08-05 DIAGNOSIS — N5089 Other specified disorders of the male genital organs: Secondary | ICD-10-CM | POA: Diagnosis not present

## 2016-08-05 DIAGNOSIS — N453 Epididymo-orchitis: Secondary | ICD-10-CM | POA: Diagnosis not present

## 2016-08-08 ENCOUNTER — Emergency Department: Payer: PPO

## 2016-08-08 ENCOUNTER — Encounter: Payer: Self-pay | Admitting: Emergency Medicine

## 2016-08-08 ENCOUNTER — Emergency Department
Admission: EM | Admit: 2016-08-08 | Discharge: 2016-08-08 | Disposition: A | Discharge status: Home / Self Care | Payer: PPO | Attending: Emergency Medicine | Admitting: Emergency Medicine

## 2016-08-08 DIAGNOSIS — R202 Paresthesia of skin: Secondary | ICD-10-CM | POA: Diagnosis not present

## 2016-08-08 DIAGNOSIS — F17200 Nicotine dependence, unspecified, uncomplicated: Secondary | ICD-10-CM | POA: Insufficient documentation

## 2016-08-08 DIAGNOSIS — Z79899 Other long term (current) drug therapy: Secondary | ICD-10-CM | POA: Diagnosis not present

## 2016-08-08 DIAGNOSIS — I129 Hypertensive chronic kidney disease with stage 1 through stage 4 chronic kidney disease, or unspecified chronic kidney disease: Secondary | ICD-10-CM | POA: Diagnosis not present

## 2016-08-08 DIAGNOSIS — R0602 Shortness of breath: Secondary | ICD-10-CM | POA: Diagnosis not present

## 2016-08-08 DIAGNOSIS — N189 Chronic kidney disease, unspecified: Secondary | ICD-10-CM | POA: Insufficient documentation

## 2016-08-08 DIAGNOSIS — Z7982 Long term (current) use of aspirin: Secondary | ICD-10-CM | POA: Insufficient documentation

## 2016-08-08 HISTORY — DX: Disorientation, unspecified: R41.0

## 2016-08-08 HISTORY — DX: Unspecified dementia, unspecified severity, without behavioral disturbance, psychotic disturbance, mood disturbance, and anxiety: F03.90

## 2016-08-08 LAB — CBC
HCT: 35.7 % — ABNORMAL LOW (ref 40.0–52.0)
Hemoglobin: 11.9 g/dL — ABNORMAL LOW (ref 13.0–18.0)
MCH: 29 pg (ref 26.0–34.0)
MCHC: 33.3 g/dL (ref 32.0–36.0)
MCV: 87 fL (ref 80.0–100.0)
PLATELETS: 268 10*3/uL (ref 150–440)
RBC: 4.11 MIL/uL — ABNORMAL LOW (ref 4.40–5.90)
RDW: 16.5 % — AB (ref 11.5–14.5)
WBC: 6.3 10*3/uL (ref 3.8–10.6)

## 2016-08-08 LAB — BASIC METABOLIC PANEL
Anion gap: 8 (ref 5–15)
BUN: 22 mg/dL — AB (ref 6–20)
CO2: 26 mmol/L (ref 22–32)
Calcium: 9.1 mg/dL (ref 8.9–10.3)
Chloride: 102 mmol/L (ref 101–111)
Creatinine, Ser: 1.12 mg/dL (ref 0.61–1.24)
Glucose, Bld: 105 mg/dL — ABNORMAL HIGH (ref 65–99)
POTASSIUM: 4.2 mmol/L (ref 3.5–5.1)
SODIUM: 136 mmol/L (ref 135–145)

## 2016-08-08 LAB — TROPONIN I: Troponin I: <0.03 ng/mL (ref <0.03)

## 2016-08-08 NOTE — ED Provider Notes (Signed)
[facility] Emergency Department Provider Note  ____________________________________________   First MD Initiated Contact with Patient 08/08/16 1651     (approximate)  I have reviewed the triage vital signs and the nursing notes.   HISTORY  Chief Complaint Shortness of Breath   HPI Russell Terry is a 76 y.o. male who presents to the emergency department for evaluation of shortness of breath on exertion. Wife states that he has surgical excision of a meningioma in December and since that time, he has been increasingly restless and has not been able to sleep well. She states that he has had lots of anxiety since he returned home from the hospitalization and she feels that this has contributed to his lack of rest. He was diagnosed with "deliruim and dementia" as well.  She states that he has also complained of intermittent shortness of breath with exertion, but states that he has been very inactive for the past few months and wonders if now that he is feeling better and is more active that his body is just less conditioned. She denies recent change in mentation. He does have a history of COPD. He denies chest pain or cough. No weakness, but complains of some "tightness and tingling" in lower extremities. No history of CHF.   Past Medical History:  Diagnosis Date  . BPH (benign prostatic hyperplasia)   . Cancer ([place])    rt kidney removed  . Chronic kidney disease    stones,rt kidney removed  . Delirium   . Dementia   . Hypertension     Patient Active Problem List   Diagnosis Date Noted  . Lumbar stenosis with neurogenic claudication 10/18/2015    Past Surgical History:  Procedure Laterality Date  . COLON SURGERY    . LUMBAR LAMINECTOMY/DECOMPRESSION MICRODISCECTOMY N/A 10/18/2015   Procedure: LUMBAR LAMINECTOMY/DECOMPRESSION MICRODISCECTOMY 3 LEVELS;  Surgeon: Newman Pies, MD;  Location: [facility];  Service: Neurosurgery;  Laterality: N/A;  L23  L34 L45 laminectomy and foraminotomy    Prior to Admission medications   Medication Sig Start Date End Date Taking? Authorizing Provider  aspirin EC 81 MG tablet Take 81 mg by mouth daily.    Historical Provider, MD  diltiazem (DILACOR XR) 240 MG 24 hr capsule Take 240 mg by mouth daily.    Historical Provider, MD  doxazosin (CARDURA) 4 MG tablet Take 4 mg by mouth daily.    Historical Provider, MD  dutasteride (AVODART) 0.5 MG capsule Take 0.5 mg by mouth daily.    Historical Provider, MD  gabapentin (NEURONTIN) 300 MG capsule Take 300-600 mg by mouth 3 (three) times daily. 300mg  in the morning, 300mg  in the afternoon, 600mg  in the evening    Historical Provider, MD  HYDROcodone-acetaminophen (NORCO/VICODIN) 5-325 MG tablet Take 1 tablet by mouth every 4 (four) hours as needed for moderate pain.    Historical Provider, MD  HYDROcodone-acetaminophen (NORCO/VICODIN) 5-325 MG tablet Take 1-2 tablets by mouth every 4 (four) hours as needed for moderate pain. 10/21/15   Kary Kos, MD  omeprazole (PRILOSEC) 20 MG capsule Take 20 mg by mouth daily.    Historical Provider, MD  tamsulosin (FLOMAX) 0.4 MG CAPS capsule Take 2 capsules (0.8 mg total) by mouth daily. 10/21/15   Kary Kos, MD  traZODone (DESYREL) 50 MG tablet Take 150 mg by mouth at bedtime.     Historical Provider, MD    Allergies Quinapril; Ambien [zolpidem tartrate]; Fenofibrate; Amoxicillin-pot clavulanate; and Morphine  No family history on  file.  Social History Social History  Substance Use Topics  . Smoking status: Current Every Day Smoker    Packs/day: 1.00    Years: 50.00  . Smokeless tobacco: Never Used  . Alcohol use No    Review of Systems Constitutional: No fever/chills Eyes: No visual changes. ENT: No sore throat. No otalgia. Cardiovascular: Denies chest pain. Respiratory: Positive for shortness of breath. Gastrointestinal: No abdominal pain.  No nausea, no vomiting.  No diarrhea.  No constipation. Genitourinary:  Negative for dysuria. Musculoskeletal: Negative for back pain. Positive for tingling in bilateral LE. Skin: Negative for rash.  Neurological: Negative for acute changes in cognition or mentation per wife. ____________________________________________   PHYSICAL EXAM:  VITAL SIGNS: ED Triage Vitals  Enc Vitals Group     BP 08/08/16 1421 137/81     Pulse Rate 08/08/16 1421 74     Resp 08/08/16 1421 20     Temp 08/08/16 1421 98.3 F (36.8 C)     Temp Source 08/08/16 1421 Oral     SpO2 08/08/16 1421 97 %     Weight 08/08/16 1422 155 lb (70.3 kg)     Height 08/08/16 1422 5' 9.5" (1.765 m)     Head Circumference --      Peak Flow --      Pain Score --      Pain Loc --      Pain Edu? --      Excl. in [place]? --     Constitutional: Alert and oriented. Well appearing and in no acute distress. Eyes: Conjunctivae are normal. PERRL. EOMI. Head: Atraumatic. Nose: No congestion/rhinnorhea. Mouth/Throat: Mucous membranes are moist.  Oropharynx non-erythematous. Neck: No stridor.   Cardiovascular: Normal rate, regular rhythm. Grossly normal heart sounds.  Good peripheral circulation. Respiratory: Normal respiratory effort.  No retractions. Lungs CTAB. Gastrointestinal: Soft and nontender. No distention. No abdominal bruits. No CVA tenderness. Musculoskeletal: No lower extremity tenderness nor edema/pitting edema.  No joint effusions. Neurologic:  Normal speech and language. No gross focal neurologic deficits are appreciated. No gait instability. Observed ambulating unassisted with steady gait.  Skin:  Skin is warm, dry and intact. No rash noted. Psychiatric: Mood and affect are normal. Speech and behavior are normal per wife. Patient is observed to be restless and has difficulty sitting still for any period of time.  ____________________________________________   LABS (all labs ordered are listed, but only abnormal results are displayed)  Labs Reviewed  BASIC METABOLIC PANEL - Abnormal;  Notable for the following:       Result Value   Glucose, Bld 105 (*)    BUN 22 (*)    All other components within normal limits  CBC - Abnormal; Notable for the following:    RBC 4.11 (*)    Hemoglobin 11.9 (*)    HCT 35.7 (*)    RDW 16.5 (*)    All other components within normal limits  TROPONIN I   ____________________________________________  EKG  Rate: 71; PR interval 174 ms; QRS 96 ms; QT 320ms; Normal sinus rhythm ____________________________________________  RADIOLOGY  IMPRESSION:  Chronic bronchitic-smoking related changes. No pneumonia or abnormal  pulmonary nodules.    ____________________________________________   PROCEDURES  Procedure(s) performed: None  Procedures  Critical Care performed: No  ____________________________________________   INITIAL IMPRESSION / ASSESSMENT AND PLAN / ED COURSE  Pertinent labs & imaging results that were available during my care of the patient were reviewed by me and considered in my medical decision making (see  chart for details).  76 year old male presenting to the emergency department for shortness of breath. Chest x-ray and labs without findings of concern. While in the ER, he had an ambulatory saturation of 95-97% without tachycardia or distress. His wife states that she had called to discuss his symptoms of insomnia with the PCP today, and was advised to increase his Seroquel to 2 pills tonight to see if that helps. They will be discharged home with the plan to follow up with Dr. Raul Del in pulmonology as scheduled next week as well as primary care. Patient and his wife were advised to return to the ER for symptoms that change or worsen or for any new urgent concerns. Both agree and feel comfortable with the plan.       ____________________________________________   FINAL CLINICAL IMPRESSION(S) / ED DIAGNOSES  Final diagnoses:  SOB (shortness of breath)      NEW MEDICATIONS STARTED DURING THIS  VISIT:  Discharge Medication List as of 08/08/2016  6:31 PM       Note:  This document was prepared using Dragon voice recognition software and may include unintentional dictation errors.    Victorino Dike, FNP 08/11/16 Sundance Yao, MD 08/11/16 2055

## 2016-08-08 NOTE — Discharge Instructions (Signed)
Return to the ER immediately for any symptoms that worsen or for new concerns if unable to see the PCP or specialist.

## 2016-08-08 NOTE — ED Notes (Signed)
Patient is complaining of tingling to legs bilaterally and difficulty breathing with exertion.  Patient reports difficulty getting to the bathroom during the night without becoming short of breath.  Patient reports symptoms started yesterday.  Patient had back surgery in April and has had increased weakness in legs for the past several weeks.

## 2016-08-08 NOTE — ED Notes (Signed)
Pt ambulated around flex by this tech. HR was stable at 65 bpm and O2 was between 95%-97%.

## 2016-08-08 NOTE — ED Triage Notes (Signed)
Patient to ER for c/o shortness of breath. Patient has h/o COPD, but denies CHF. Patient reports he feels like he has chest tightness and tightness in his legs, as well as weakness in both legs. Patient denies any swelling to legs. Patient able to speak in complete sentences without difficulty. Patient states he feels like his chest is congested. Denies any fevers, wheezing, or cough.

## 2016-08-13 DIAGNOSIS — F411 Generalized anxiety disorder: Secondary | ICD-10-CM | POA: Diagnosis not present

## 2016-08-13 DIAGNOSIS — G308 Other Alzheimer's disease: Secondary | ICD-10-CM | POA: Diagnosis not present

## 2016-08-13 DIAGNOSIS — F0280 Dementia in other diseases classified elsewhere without behavioral disturbance: Secondary | ICD-10-CM | POA: Diagnosis not present

## 2016-08-13 DIAGNOSIS — G4700 Insomnia, unspecified: Secondary | ICD-10-CM | POA: Diagnosis not present

## 2016-08-13 DIAGNOSIS — I4891 Unspecified atrial fibrillation: Secondary | ICD-10-CM | POA: Diagnosis not present

## 2016-08-13 DIAGNOSIS — D329 Benign neoplasm of meninges, unspecified: Secondary | ICD-10-CM | POA: Diagnosis not present

## 2016-08-14 DIAGNOSIS — D329 Benign neoplasm of meninges, unspecified: Secondary | ICD-10-CM | POA: Diagnosis not present

## 2016-08-19 DIAGNOSIS — D420 Neoplasm of uncertain behavior of cerebral meninges: Secondary | ICD-10-CM | POA: Diagnosis not present

## 2016-08-19 DIAGNOSIS — D320 Benign neoplasm of cerebral meninges: Secondary | ICD-10-CM | POA: Diagnosis not present

## 2016-08-20 DIAGNOSIS — D329 Benign neoplasm of meninges, unspecified: Secondary | ICD-10-CM | POA: Diagnosis not present

## 2016-08-20 DIAGNOSIS — D320 Benign neoplasm of cerebral meninges: Secondary | ICD-10-CM | POA: Diagnosis not present

## 2016-08-29 DIAGNOSIS — D320 Benign neoplasm of cerebral meninges: Secondary | ICD-10-CM | POA: Diagnosis not present

## 2016-09-02 DIAGNOSIS — D320 Benign neoplasm of cerebral meninges: Secondary | ICD-10-CM | POA: Diagnosis not present

## 2016-09-03 DIAGNOSIS — D320 Benign neoplasm of cerebral meninges: Secondary | ICD-10-CM | POA: Diagnosis not present

## 2016-09-04 DIAGNOSIS — D320 Benign neoplasm of cerebral meninges: Secondary | ICD-10-CM | POA: Diagnosis not present

## 2016-09-05 DIAGNOSIS — D320 Benign neoplasm of cerebral meninges: Secondary | ICD-10-CM | POA: Diagnosis not present

## 2016-09-09 DIAGNOSIS — D320 Benign neoplasm of cerebral meninges: Secondary | ICD-10-CM | POA: Diagnosis not present

## 2016-09-10 DIAGNOSIS — D320 Benign neoplasm of cerebral meninges: Secondary | ICD-10-CM | POA: Diagnosis not present

## 2016-09-11 DIAGNOSIS — D320 Benign neoplasm of cerebral meninges: Secondary | ICD-10-CM | POA: Diagnosis not present

## 2016-09-12 DIAGNOSIS — D320 Benign neoplasm of cerebral meninges: Secondary | ICD-10-CM | POA: Diagnosis not present

## 2016-09-12 DIAGNOSIS — D329 Benign neoplasm of meninges, unspecified: Secondary | ICD-10-CM | POA: Diagnosis not present

## 2016-09-13 DIAGNOSIS — D320 Benign neoplasm of cerebral meninges: Secondary | ICD-10-CM | POA: Diagnosis not present

## 2016-09-16 DIAGNOSIS — D320 Benign neoplasm of cerebral meninges: Secondary | ICD-10-CM | POA: Diagnosis not present

## 2016-09-17 DIAGNOSIS — D329 Benign neoplasm of meninges, unspecified: Secondary | ICD-10-CM | POA: Diagnosis not present

## 2016-09-17 DIAGNOSIS — I4891 Unspecified atrial fibrillation: Secondary | ICD-10-CM | POA: Diagnosis not present

## 2016-09-17 DIAGNOSIS — F0280 Dementia in other diseases classified elsewhere without behavioral disturbance: Secondary | ICD-10-CM | POA: Diagnosis not present

## 2016-09-17 DIAGNOSIS — F411 Generalized anxiety disorder: Secondary | ICD-10-CM | POA: Diagnosis not present

## 2016-09-17 DIAGNOSIS — R259 Unspecified abnormal involuntary movements: Secondary | ICD-10-CM | POA: Diagnosis not present

## 2016-09-17 DIAGNOSIS — G308 Other Alzheimer's disease: Secondary | ICD-10-CM | POA: Diagnosis not present

## 2016-09-17 DIAGNOSIS — D320 Benign neoplasm of cerebral meninges: Secondary | ICD-10-CM | POA: Diagnosis not present

## 2016-09-17 DIAGNOSIS — G4700 Insomnia, unspecified: Secondary | ICD-10-CM | POA: Diagnosis not present

## 2016-09-18 DIAGNOSIS — D320 Benign neoplasm of cerebral meninges: Secondary | ICD-10-CM | POA: Diagnosis not present

## 2016-09-19 DIAGNOSIS — D320 Benign neoplasm of cerebral meninges: Secondary | ICD-10-CM | POA: Diagnosis not present

## 2016-09-23 DIAGNOSIS — D320 Benign neoplasm of cerebral meninges: Secondary | ICD-10-CM | POA: Diagnosis not present

## 2016-09-24 DIAGNOSIS — D320 Benign neoplasm of cerebral meninges: Secondary | ICD-10-CM | POA: Diagnosis not present

## 2016-09-25 DIAGNOSIS — D320 Benign neoplasm of cerebral meninges: Secondary | ICD-10-CM | POA: Diagnosis not present

## 2016-09-26 DIAGNOSIS — D320 Benign neoplasm of cerebral meninges: Secondary | ICD-10-CM | POA: Diagnosis not present

## 2016-09-27 DIAGNOSIS — D320 Benign neoplasm of cerebral meninges: Secondary | ICD-10-CM | POA: Diagnosis not present

## 2016-09-30 DIAGNOSIS — D320 Benign neoplasm of cerebral meninges: Secondary | ICD-10-CM | POA: Diagnosis not present

## 2016-10-01 DIAGNOSIS — D320 Benign neoplasm of cerebral meninges: Secondary | ICD-10-CM | POA: Diagnosis not present

## 2016-10-02 DIAGNOSIS — D320 Benign neoplasm of cerebral meninges: Secondary | ICD-10-CM | POA: Diagnosis not present

## 2016-10-03 DIAGNOSIS — D320 Benign neoplasm of cerebral meninges: Secondary | ICD-10-CM | POA: Diagnosis not present

## 2016-10-04 DIAGNOSIS — D320 Benign neoplasm of cerebral meninges: Secondary | ICD-10-CM | POA: Diagnosis not present

## 2016-10-07 DIAGNOSIS — D320 Benign neoplasm of cerebral meninges: Secondary | ICD-10-CM | POA: Diagnosis not present

## 2016-10-08 DIAGNOSIS — F411 Generalized anxiety disorder: Secondary | ICD-10-CM | POA: Insufficient documentation

## 2016-10-08 DIAGNOSIS — D329 Benign neoplasm of meninges, unspecified: Secondary | ICD-10-CM

## 2016-10-08 DIAGNOSIS — G4700 Insomnia, unspecified: Secondary | ICD-10-CM | POA: Insufficient documentation

## 2016-10-08 DIAGNOSIS — F0280 Dementia in other diseases classified elsewhere without behavioral disturbance: Secondary | ICD-10-CM | POA: Diagnosis not present

## 2016-10-08 DIAGNOSIS — R259 Unspecified abnormal involuntary movements: Secondary | ICD-10-CM

## 2016-10-08 DIAGNOSIS — G308 Other Alzheimer's disease: Secondary | ICD-10-CM | POA: Diagnosis not present

## 2016-10-08 DIAGNOSIS — R29818 Other symptoms and signs involving the nervous system: Secondary | ICD-10-CM

## 2016-10-08 DIAGNOSIS — I4891 Unspecified atrial fibrillation: Secondary | ICD-10-CM | POA: Diagnosis not present

## 2016-10-08 DIAGNOSIS — D320 Benign neoplasm of cerebral meninges: Secondary | ICD-10-CM | POA: Diagnosis not present

## 2016-10-08 HISTORY — DX: Other symptoms and signs involving the nervous system: R29.818

## 2016-10-08 HISTORY — DX: Benign neoplasm of meninges, unspecified: D32.9

## 2016-10-08 HISTORY — DX: Unspecified abnormal involuntary movements: R25.9

## 2016-10-09 DIAGNOSIS — D320 Benign neoplasm of cerebral meninges: Secondary | ICD-10-CM | POA: Diagnosis not present

## 2016-10-10 DIAGNOSIS — D320 Benign neoplasm of cerebral meninges: Secondary | ICD-10-CM | POA: Diagnosis not present

## 2016-10-11 DIAGNOSIS — D320 Benign neoplasm of cerebral meninges: Secondary | ICD-10-CM | POA: Diagnosis not present

## 2016-10-14 DIAGNOSIS — D420 Neoplasm of uncertain behavior of cerebral meninges: Secondary | ICD-10-CM | POA: Diagnosis not present

## 2016-10-14 DIAGNOSIS — D320 Benign neoplasm of cerebral meninges: Secondary | ICD-10-CM | POA: Diagnosis not present

## 2016-10-28 DIAGNOSIS — N401 Enlarged prostate with lower urinary tract symptoms: Secondary | ICD-10-CM | POA: Diagnosis not present

## 2016-10-28 DIAGNOSIS — N138 Other obstructive and reflux uropathy: Secondary | ICD-10-CM | POA: Diagnosis not present

## 2016-10-28 DIAGNOSIS — R972 Elevated prostate specific antigen [PSA]: Secondary | ICD-10-CM | POA: Diagnosis not present

## 2016-10-28 DIAGNOSIS — C649 Malignant neoplasm of unspecified kidney, except renal pelvis: Secondary | ICD-10-CM | POA: Diagnosis not present

## 2016-10-28 DIAGNOSIS — N200 Calculus of kidney: Secondary | ICD-10-CM | POA: Diagnosis not present

## 2016-11-05 DIAGNOSIS — N400 Enlarged prostate without lower urinary tract symptoms: Secondary | ICD-10-CM | POA: Diagnosis not present

## 2016-11-05 DIAGNOSIS — N3289 Other specified disorders of bladder: Secondary | ICD-10-CM | POA: Diagnosis not present

## 2016-11-05 DIAGNOSIS — N2889 Other specified disorders of kidney and ureter: Secondary | ICD-10-CM | POA: Diagnosis not present

## 2016-11-05 DIAGNOSIS — N200 Calculus of kidney: Secondary | ICD-10-CM | POA: Diagnosis not present

## 2016-11-05 DIAGNOSIS — R9341 Abnormal radiologic findings on diagnostic imaging of renal pelvis, ureter, or bladder: Secondary | ICD-10-CM | POA: Diagnosis not present

## 2016-11-06 DIAGNOSIS — L905 Scar conditions and fibrosis of skin: Secondary | ICD-10-CM | POA: Diagnosis not present

## 2016-11-06 DIAGNOSIS — C44319 Basal cell carcinoma of skin of other parts of face: Secondary | ICD-10-CM | POA: Diagnosis not present

## 2016-11-18 ENCOUNTER — Other Ambulatory Visit: Payer: Self-pay | Admitting: Neurosurgery

## 2016-11-18 DIAGNOSIS — D329 Benign neoplasm of meninges, unspecified: Secondary | ICD-10-CM

## 2016-11-19 DIAGNOSIS — N401 Enlarged prostate with lower urinary tract symptoms: Secondary | ICD-10-CM | POA: Diagnosis not present

## 2016-11-19 DIAGNOSIS — N138 Other obstructive and reflux uropathy: Secondary | ICD-10-CM | POA: Diagnosis not present

## 2016-11-19 DIAGNOSIS — R399 Unspecified symptoms and signs involving the genitourinary system: Secondary | ICD-10-CM | POA: Diagnosis not present

## 2016-11-19 DIAGNOSIS — N329 Bladder disorder, unspecified: Secondary | ICD-10-CM | POA: Diagnosis not present

## 2016-11-19 DIAGNOSIS — N200 Calculus of kidney: Secondary | ICD-10-CM | POA: Diagnosis not present

## 2016-11-21 DIAGNOSIS — E785 Hyperlipidemia, unspecified: Secondary | ICD-10-CM | POA: Diagnosis not present

## 2016-11-21 DIAGNOSIS — I480 Paroxysmal atrial fibrillation: Secondary | ICD-10-CM | POA: Diagnosis not present

## 2016-11-21 DIAGNOSIS — J449 Chronic obstructive pulmonary disease, unspecified: Secondary | ICD-10-CM | POA: Diagnosis not present

## 2016-11-21 DIAGNOSIS — I10 Essential (primary) hypertension: Secondary | ICD-10-CM | POA: Diagnosis not present

## 2016-12-17 DIAGNOSIS — R5383 Other fatigue: Secondary | ICD-10-CM | POA: Diagnosis not present

## 2016-12-17 DIAGNOSIS — Z86010 Personal history of colonic polyps: Secondary | ICD-10-CM | POA: Diagnosis not present

## 2016-12-19 DIAGNOSIS — Z86010 Personal history of colonic polyps: Secondary | ICD-10-CM | POA: Diagnosis not present

## 2016-12-19 DIAGNOSIS — R5383 Other fatigue: Secondary | ICD-10-CM | POA: Diagnosis not present

## 2016-12-30 ENCOUNTER — Encounter: Payer: Self-pay | Admitting: *Deleted

## 2016-12-31 ENCOUNTER — Encounter
Admission: RE | Disposition: A | Discharge status: Home / Self Care | Payer: Self-pay | Source: Ambulatory Visit | Attending: Gastroenterology

## 2016-12-31 ENCOUNTER — Ambulatory Visit
Admission: RE | Admit: 2016-12-31 | Discharge: 2016-12-31 | Disposition: A | Discharge status: Home / Self Care | Payer: PPO | Source: Ambulatory Visit | Attending: Gastroenterology | Admitting: Gastroenterology

## 2016-12-31 ENCOUNTER — Encounter: Payer: Self-pay | Admitting: Anesthesiology

## 2016-12-31 ENCOUNTER — Ambulatory Visit: Payer: PPO | Admitting: Certified Registered Nurse Anesthetist

## 2016-12-31 DIAGNOSIS — N189 Chronic kidney disease, unspecified: Secondary | ICD-10-CM | POA: Diagnosis not present

## 2016-12-31 DIAGNOSIS — Z7982 Long term (current) use of aspirin: Secondary | ICD-10-CM | POA: Diagnosis not present

## 2016-12-31 DIAGNOSIS — Z79899 Other long term (current) drug therapy: Secondary | ICD-10-CM | POA: Insufficient documentation

## 2016-12-31 DIAGNOSIS — Z1211 Encounter for screening for malignant neoplasm of colon: Secondary | ICD-10-CM | POA: Insufficient documentation

## 2016-12-31 DIAGNOSIS — I129 Hypertensive chronic kidney disease with stage 1 through stage 4 chronic kidney disease, or unspecified chronic kidney disease: Secondary | ICD-10-CM | POA: Insufficient documentation

## 2016-12-31 DIAGNOSIS — K5730 Diverticulosis of large intestine without perforation or abscess without bleeding: Secondary | ICD-10-CM | POA: Insufficient documentation

## 2016-12-31 DIAGNOSIS — Z905 Acquired absence of kidney: Secondary | ICD-10-CM | POA: Insufficient documentation

## 2016-12-31 DIAGNOSIS — F17200 Nicotine dependence, unspecified, uncomplicated: Secondary | ICD-10-CM | POA: Insufficient documentation

## 2016-12-31 DIAGNOSIS — Z980 Intestinal bypass and anastomosis status: Secondary | ICD-10-CM | POA: Insufficient documentation

## 2016-12-31 DIAGNOSIS — F0390 Unspecified dementia without behavioral disturbance: Secondary | ICD-10-CM | POA: Insufficient documentation

## 2016-12-31 DIAGNOSIS — N402 Nodular prostate without lower urinary tract symptoms: Secondary | ICD-10-CM | POA: Diagnosis not present

## 2016-12-31 DIAGNOSIS — K5790 Diverticulosis of intestine, part unspecified, without perforation or abscess without bleeding: Secondary | ICD-10-CM | POA: Diagnosis not present

## 2016-12-31 DIAGNOSIS — K648 Other hemorrhoids: Secondary | ICD-10-CM | POA: Diagnosis not present

## 2016-12-31 DIAGNOSIS — N400 Enlarged prostate without lower urinary tract symptoms: Secondary | ICD-10-CM | POA: Insufficient documentation

## 2016-12-31 DIAGNOSIS — Z86010 Personal history of colonic polyps: Secondary | ICD-10-CM | POA: Insufficient documentation

## 2016-12-31 HISTORY — PX: COLONOSCOPY WITH PROPOFOL: SHX5780

## 2016-12-31 SURGERY — COLONOSCOPY WITH PROPOFOL
Anesthesia: General

## 2016-12-31 MED ORDER — FENTANYL CITRATE (PF) 100 MCG/2ML IJ SOLN
INTRAMUSCULAR | Status: DC | PRN
  Administered 2016-12-31: 50 ug via INTRAVENOUS

## 2016-12-31 MED ORDER — PROPOFOL 10 MG/ML IV BOLUS
INTRAVENOUS | Status: DC | PRN
  Administered 2016-12-31 (×2): 21 mg via INTRAVENOUS

## 2016-12-31 MED ORDER — FENTANYL CITRATE (PF) 100 MCG/2ML IJ SOLN
INTRAMUSCULAR | Status: AC
  Filled 2016-12-31: qty 2

## 2016-12-31 MED ORDER — LIDOCAINE HCL (PF) 2 % IJ SOLN
INTRAMUSCULAR | Status: AC
  Filled 2016-12-31: qty 2

## 2016-12-31 MED ORDER — SODIUM CHLORIDE 0.9 % IV SOLN
INTRAVENOUS | Status: DC
  Administered 2016-12-31: 1000 mL via INTRAVENOUS

## 2016-12-31 MED ORDER — PROPOFOL 500 MG/50ML IV EMUL
INTRAVENOUS | Status: DC | PRN
  Administered 2016-12-31: 120 ug/kg/min via INTRAVENOUS

## 2016-12-31 MED ORDER — SODIUM CHLORIDE 0.9 % IV SOLN: INTRAVENOUS | Status: DC

## 2016-12-31 MED ORDER — LIDOCAINE HCL (CARDIAC) 20 MG/ML IV SOLN
INTRAVENOUS | Status: DC | PRN
  Administered 2016-12-31: 100 mg via INTRAVENOUS

## 2016-12-31 MED ORDER — PROPOFOL 500 MG/50ML IV EMUL
INTRAVENOUS | Status: AC
  Filled 2016-12-31: qty 50

## 2016-12-31 NOTE — Anesthesia Post-op Follow-up Note (Cosign Needed)
Anesthesia QCDR form completed.        

## 2016-12-31 NOTE — Anesthesia Postprocedure Evaluation (Signed)
Anesthesia Post Note  Patient: Russell Terry  Procedure(s) Performed: Procedure(s) (LRB): COLONOSCOPY WITH PROPOFOL (N/A)  Patient location during evaluation: Endoscopy Anesthesia Type: General Level of consciousness: awake and alert Pain management: pain level controlled Vital Signs Assessment: post-procedure vital signs reviewed and stable Respiratory status: spontaneous breathing, nonlabored ventilation, respiratory function stable and patient connected to nasal cannula oxygen Cardiovascular status: blood pressure returned to baseline and stable Postop Assessment: no signs of nausea or vomiting Anesthetic complications: no     Last Vitals:  Vitals:   12/31/16 1050 12/31/16 1100  BP: (!) 160/90   Pulse: (!) 51 (!) 56  Resp: 12 14  Temp:      Last Pain:  Vitals:   12/31/16 1020  TempSrc: Tympanic                 Martha Clan

## 2016-12-31 NOTE — Op Note (Signed)
[facility] Gastroenterology Patient Name: Russell Terry Procedure Date: 12/31/2016 9:46 AM MRN: [id number] Account #: [id number] Date of Birth: 1941-04-18 Admit Type: Outpatient Age: 76 Room: [facility] ENDO ROOM 3 Gender: Male Note Status: Finalized Procedure:            Colonoscopy Indications:          Personal history of colonic polyps Providers:            Lollie Sails, MD Referring MD:         Irven Easterly. Kary Kos, MD (Referring MD) Medicines:            Monitored Anesthesia Care Complications:        No immediate complications. Procedure:            Pre-Anesthesia Assessment:                       - ASA Grade Assessment: III - A patient with severe                        systemic disease.                       After obtaining informed consent, the colonoscope was                        passed under direct vision. Throughout the procedure,                        the patient's blood pressure, pulse, and oxygen                        saturations were monitored continuously. The                        Colonoscope was introduced through the anus and                        advanced to the the ileocolonic anastomosis. The                        colonoscopy was unusually difficult due to poor bowel                        prep with stool present. Successful completion of the                        procedure was aided by lavage. Findings:      Multiple small and large-mouthed diverticula were found in the sigmoid       colon, descending colon and transverse colon.      There was evidence of a prior end-to-side ileo-colonic anastomosis in       the proximal transverse colon. This was patent and was characterized by       healthy appearing mucosa.      Non-bleeding internal hemorrhoids were found during retroflexion, during       digital exam and during anoscopy. The hemorrhoids were medium-sized.      The digital rectal exam findings include prostate  nodule. Impression:           - Diverticulosis in the sigmoid colon, in the  descending colon and in the transverse colon.                       - Patent end-to-side ileo-colonic anastomosis,                        characterized by healthy appearing mucosa.                       - Non-bleeding internal hemorrhoids.                       - A prostate nodule found on digital rectal exam.                       - No specimens collected. Recommendation:       - Check PSA in 1 week. Procedure Code(s):    --- Professional ---                       [phone], Colonoscopy, flexible; diagnostic, including                        collection of specimen(s) by brushing or washing, when                        performed (separate procedure) Diagnosis Code(s):    --- Professional ---                       K64.8, Other hemorrhoids                       Z98.0, Intestinal bypass and anastomosis status                       N40.2, Nodular prostate without lower urinary tract                        symptoms                       Z86.010, Personal history of colonic polyps                       K57.30, Diverticulosis of large intestine without                        perforation or abscess without bleeding CPT copyright 2016 American Medical Association. All rights reserved. The codes documented in this report are preliminary and upon coder review may  be revised to meet current compliance requirements. Lollie Sails, MD 12/31/2016 10:19:35 AM This report has been signed electronically. Number of Addenda: 0 Note Initiated On: 12/31/2016 9:46 AM Scope Withdrawal Time: 0 hours 6 minutes 44 seconds  Total Procedure Duration: 0 hours 17 minutes 41 seconds       [facility]

## 2016-12-31 NOTE — Transfer of Care (Signed)
Immediate Anesthesia Transfer of Care Note  Patient: Russell Terry  Procedure(s) Performed: Procedure(s): COLONOSCOPY WITH PROPOFOL (N/A)  Patient Location: PACU  Anesthesia Type:General  Level of Consciousness: awake and patient cooperative  Airway & Oxygen Therapy: Patient Spontanous Breathing and Patient connected to nasal cannula oxygen  Post-op Assessment: Report given to RN and Post -op Vital signs reviewed and stable  Post vital signs: Reviewed and stable  Last Vitals:  Vitals:   12/31/16 0914  BP: 124/79  Pulse: 68  Resp: 16  Temp: 36.8 C    Last Pain:  Vitals:   12/31/16 0914  TempSrc: Tympanic         Complications: No apparent anesthesia complications

## 2016-12-31 NOTE — H&P (Signed)
Outpatient short stay form Pre-procedure 12/31/2016 9:43 AM Lollie Sails MD  Primary Physician: Dr. [place] Pink  Reason for visit:  Colonoscopy  History of present illness:  Patient is a 76 year old male presenting today as above. He has personal history of adenomatous colon polyps and did have a right hemicolectomy in 2012 for a non-endoscopically removable large polyp. He does take 81 mg aspirin that is been held today. He takes no other aspirin or blood thinning agents.    Current Facility-Administered Medications:  .  0.9 %  sodium chloride infusion, , Intravenous, Continuous, Lollie Sails, MD, Last Rate: 20 mL/hr at 12/31/16 0925, 1,000 mL at 12/31/16 0925 .  0.9 %  sodium chloride infusion, , Intravenous, Continuous, Lollie Sails, MD  Prescriptions Prior to Admission  Medication Sig Dispense Refill Last Dose  . aspirin EC 81 MG tablet Take 81 mg by mouth daily.   Past Week at Unknown time  . diltiazem (DILACOR XR) 240 MG 24 hr capsule Take 240 mg by mouth daily.   12/31/2016 at 0730  . doxazosin (CARDURA) 4 MG tablet Take 4 mg by mouth daily.   12/31/2016 at 0730  . dutasteride (AVODART) 0.5 MG capsule Take 0.5 mg by mouth daily.   Past Week at Unknown time  . gabapentin (NEURONTIN) 300 MG capsule Take 300-600 mg by mouth 3 (three) times daily. 300mg  in the morning, 300mg  in the afternoon, 600mg  in the evening   12/30/2016 at Unknown time  . omeprazole (PRILOSEC) 20 MG capsule Take 20 mg by mouth daily.   12/31/2016 at 0730  . tamsulosin (FLOMAX) 0.4 MG CAPS capsule Take 2 capsules (0.8 mg total) by mouth daily. 40 capsule 0 12/31/2016 at 0730  . traZODone (DESYREL) 50 MG tablet Take 150 mg by mouth at bedtime.    12/30/2016 at Unknown time  . HYDROcodone-acetaminophen (NORCO/VICODIN) 5-325 MG tablet Take 1 tablet by mouth every 4 (four) hours as needed for moderate pain.   Completed Course at Unknown time  . HYDROcodone-acetaminophen (NORCO/VICODIN) 5-325 MG tablet Take  1-2 tablets by mouth every 4 (four) hours as needed for moderate pain. (Patient not taking: Reported on 12/31/2016) 60 tablet 0 Completed Course at Unknown time     Allergies  Allergen Reactions  . Quinapril Cough  . Ambien [Zolpidem Tartrate] Other (See Comments)    Unspecified reaction  . Fenofibrate Other (See Comments)    Unknown  . Amoxicillin-Pot Clavulanate Nausea And Vomiting  . Morphine Nausea Only     Past Medical History:  Diagnosis Date  . BPH (benign prostatic hyperplasia)   . Cancer ([place])    rt kidney removed  . Chronic kidney disease    stones,rt kidney removed  . Delirium   . Dementia   . Hypertension     Review of systems:      Physical Exam    Heart and lungs: Regular rate and rhythm without rub or gallop, lungs are bilaterally clear.    HEENT: Normocephalic atraumatic eyes are anicteric    Other:     Pertinant exam for procedure: Soft nontender nondistended bowel sounds positive normoactive.    Planned proceedures: Colonoscopy and indicated procedures. I have discussed the risks benefits and complications of procedures to include not limited to bleeding, infection, perforation and the risk of sedation and the patient wishes to proceed.    Lollie Sails, MD Gastroenterology 12/31/2016  9:43 AM

## 2016-12-31 NOTE — Anesthesia Preprocedure Evaluation (Signed)
Anesthesia Evaluation  Patient identified by MRN, date of birth, ID band Patient awake    Reviewed: Allergy & Precautions, H&P , NPO status , Patient's Chart, lab work & pertinent test results, reviewed documented beta blocker date and time   History of Anesthesia Complications Negative for: history of anesthetic complications  Airway Mallampati: II  TM Distance: >3 FB Neck ROM: full    Dental  (+) Dental Advidsory Given, Missing, Poor Dentition   Pulmonary shortness of breath and with exertion, neg sleep apnea, COPD, neg recent URI, Current Smoker,           Cardiovascular Exercise Tolerance: Good hypertension, (-) angina+ DOE  (-) CAD, (-) Past MI, (-) Cardiac Stents and (-) CABG Normal cardiovascular exam(-) dysrhythmias (-) Valvular Problems/Murmurs     Neuro/Psych PSYCHIATRIC DISORDERS (Dementia) negative neurological ROS     GI/Hepatic negative GI ROS, Neg liver ROS,   Endo/Other  negative endocrine ROS  Renal/GU CRFRenal disease  negative genitourinary   Musculoskeletal   Abdominal   Peds  Hematology negative hematology ROS (+)   Anesthesia Other Findings Past Medical History: No date: BPH (benign prostatic hyperplasia) No date: Cancer ([name])     Comment: rt kidney removed No date: Chronic kidney disease     Comment: stones,rt kidney removed No date: Delirium No date: Dementia No date: Hypertension   Reproductive/Obstetrics negative OB ROS                             Anesthesia Physical Anesthesia Plan  ASA: III  Anesthesia Plan: General   Post-op Pain Management:    Induction: Intravenous  PONV Risk Score and Plan: 1 and Propofol  Airway Management Planned: Natural Airway  Additional Equipment:   Intra-op Plan:   Post-operative Plan:   Informed Consent: I have reviewed the patients History and Physical, chart, labs and discussed the procedure including the  risks, benefits and alternatives for the proposed anesthesia with the patient or authorized representative who has indicated his/her understanding and acceptance.   Dental Advisory Given  Plan Discussed with: Anesthesiologist, CRNA and Surgeon  Anesthesia Plan Comments:         Anesthesia Quick Evaluation

## 2017-01-01 ENCOUNTER — Encounter: Payer: Self-pay | Admitting: Gastroenterology

## 2017-01-07 DIAGNOSIS — M545 Low back pain: Secondary | ICD-10-CM | POA: Diagnosis not present

## 2017-01-07 DIAGNOSIS — I10 Essential (primary) hypertension: Secondary | ICD-10-CM | POA: Diagnosis not present

## 2017-01-08 DIAGNOSIS — G4700 Insomnia, unspecified: Secondary | ICD-10-CM | POA: Diagnosis not present

## 2017-01-08 DIAGNOSIS — R0683 Snoring: Secondary | ICD-10-CM | POA: Diagnosis not present

## 2017-01-08 DIAGNOSIS — D329 Benign neoplasm of meninges, unspecified: Secondary | ICD-10-CM | POA: Diagnosis not present

## 2017-01-08 DIAGNOSIS — F0280 Dementia in other diseases classified elsewhere without behavioral disturbance: Secondary | ICD-10-CM | POA: Diagnosis not present

## 2017-01-08 DIAGNOSIS — F411 Generalized anxiety disorder: Secondary | ICD-10-CM | POA: Diagnosis not present

## 2017-01-08 DIAGNOSIS — G308 Other Alzheimer's disease: Secondary | ICD-10-CM | POA: Diagnosis not present

## 2017-01-08 DIAGNOSIS — I4891 Unspecified atrial fibrillation: Secondary | ICD-10-CM | POA: Diagnosis not present

## 2017-01-08 DIAGNOSIS — R259 Unspecified abnormal involuntary movements: Secondary | ICD-10-CM | POA: Diagnosis not present

## 2017-02-05 DIAGNOSIS — D2271 Melanocytic nevi of right lower limb, including hip: Secondary | ICD-10-CM | POA: Diagnosis not present

## 2017-02-05 DIAGNOSIS — D225 Melanocytic nevi of trunk: Secondary | ICD-10-CM | POA: Diagnosis not present

## 2017-02-05 DIAGNOSIS — X32XXXA Exposure to sunlight, initial encounter: Secondary | ICD-10-CM | POA: Diagnosis not present

## 2017-02-05 DIAGNOSIS — L821 Other seborrheic keratosis: Secondary | ICD-10-CM | POA: Diagnosis not present

## 2017-02-05 DIAGNOSIS — Z85828 Personal history of other malignant neoplasm of skin: Secondary | ICD-10-CM | POA: Diagnosis not present

## 2017-02-05 DIAGNOSIS — L570 Actinic keratosis: Secondary | ICD-10-CM | POA: Diagnosis not present

## 2017-02-12 DIAGNOSIS — Z483 Aftercare following surgery for neoplasm: Secondary | ICD-10-CM | POA: Diagnosis not present

## 2017-02-12 DIAGNOSIS — D329 Benign neoplasm of meninges, unspecified: Secondary | ICD-10-CM | POA: Diagnosis not present

## 2017-02-12 DIAGNOSIS — Z86011 Personal history of benign neoplasm of the brain: Secondary | ICD-10-CM | POA: Diagnosis not present

## 2017-03-14 DIAGNOSIS — R509 Fever, unspecified: Secondary | ICD-10-CM | POA: Diagnosis not present

## 2017-03-14 DIAGNOSIS — Z79899 Other long term (current) drug therapy: Secondary | ICD-10-CM | POA: Diagnosis not present

## 2017-03-14 DIAGNOSIS — Z885 Allergy status to narcotic agent status: Secondary | ICD-10-CM | POA: Diagnosis not present

## 2017-03-14 DIAGNOSIS — R911 Solitary pulmonary nodule: Secondary | ICD-10-CM | POA: Diagnosis not present

## 2017-03-14 DIAGNOSIS — F0280 Dementia in other diseases classified elsewhere without behavioral disturbance: Secondary | ICD-10-CM | POA: Diagnosis not present

## 2017-03-14 DIAGNOSIS — R1013 Epigastric pain: Secondary | ICD-10-CM | POA: Diagnosis not present

## 2017-03-14 DIAGNOSIS — Z7982 Long term (current) use of aspirin: Secondary | ICD-10-CM | POA: Diagnosis not present

## 2017-03-14 DIAGNOSIS — E860 Dehydration: Secondary | ICD-10-CM | POA: Diagnosis not present

## 2017-03-14 DIAGNOSIS — N390 Urinary tract infection, site not specified: Secondary | ICD-10-CM | POA: Diagnosis not present

## 2017-03-14 DIAGNOSIS — F17210 Nicotine dependence, cigarettes, uncomplicated: Secondary | ICD-10-CM | POA: Diagnosis not present

## 2017-03-14 DIAGNOSIS — G20 Parkinson's disease: Secondary | ICD-10-CM | POA: Diagnosis not present

## 2017-03-14 DIAGNOSIS — I10 Essential (primary) hypertension: Secondary | ICD-10-CM | POA: Diagnosis not present

## 2017-03-25 DIAGNOSIS — F17200 Nicotine dependence, unspecified, uncomplicated: Secondary | ICD-10-CM | POA: Diagnosis not present

## 2017-03-25 DIAGNOSIS — Z09 Encounter for follow-up examination after completed treatment for conditions other than malignant neoplasm: Secondary | ICD-10-CM | POA: Diagnosis not present

## 2017-03-25 DIAGNOSIS — N390 Urinary tract infection, site not specified: Secondary | ICD-10-CM | POA: Diagnosis not present

## 2017-03-25 DIAGNOSIS — E860 Dehydration: Secondary | ICD-10-CM | POA: Diagnosis not present

## 2017-03-25 DIAGNOSIS — J440 Chronic obstructive pulmonary disease with acute lower respiratory infection: Secondary | ICD-10-CM | POA: Diagnosis not present

## 2017-03-25 DIAGNOSIS — J209 Acute bronchitis, unspecified: Secondary | ICD-10-CM | POA: Diagnosis not present

## 2017-04-02 DIAGNOSIS — G4700 Insomnia, unspecified: Secondary | ICD-10-CM | POA: Diagnosis not present

## 2017-04-02 DIAGNOSIS — I4891 Unspecified atrial fibrillation: Secondary | ICD-10-CM | POA: Diagnosis not present

## 2017-04-02 DIAGNOSIS — R0683 Snoring: Secondary | ICD-10-CM | POA: Diagnosis not present

## 2017-04-02 DIAGNOSIS — R259 Unspecified abnormal involuntary movements: Secondary | ICD-10-CM | POA: Diagnosis not present

## 2017-04-02 DIAGNOSIS — D329 Benign neoplasm of meninges, unspecified: Secondary | ICD-10-CM | POA: Diagnosis not present

## 2017-04-02 DIAGNOSIS — F0280 Dementia in other diseases classified elsewhere without behavioral disturbance: Secondary | ICD-10-CM | POA: Diagnosis not present

## 2017-04-02 DIAGNOSIS — G308 Other Alzheimer's disease: Secondary | ICD-10-CM | POA: Diagnosis not present

## 2017-04-02 DIAGNOSIS — F411 Generalized anxiety disorder: Secondary | ICD-10-CM | POA: Diagnosis not present

## 2017-04-17 DIAGNOSIS — R0609 Other forms of dyspnea: Secondary | ICD-10-CM | POA: Diagnosis not present

## 2017-04-17 DIAGNOSIS — J449 Chronic obstructive pulmonary disease, unspecified: Secondary | ICD-10-CM | POA: Diagnosis not present

## 2017-04-17 DIAGNOSIS — C649 Malignant neoplasm of unspecified kidney, except renal pelvis: Secondary | ICD-10-CM | POA: Diagnosis not present

## 2017-04-18 ENCOUNTER — Other Ambulatory Visit: Payer: Self-pay | Admitting: Specialist

## 2017-04-18 DIAGNOSIS — J449 Chronic obstructive pulmonary disease, unspecified: Secondary | ICD-10-CM

## 2017-06-10 DIAGNOSIS — J449 Chronic obstructive pulmonary disease, unspecified: Secondary | ICD-10-CM | POA: Diagnosis not present

## 2017-06-10 DIAGNOSIS — E785 Hyperlipidemia, unspecified: Secondary | ICD-10-CM | POA: Diagnosis not present

## 2017-06-10 DIAGNOSIS — R259 Unspecified abnormal involuntary movements: Secondary | ICD-10-CM | POA: Diagnosis not present

## 2017-06-10 DIAGNOSIS — D329 Benign neoplasm of meninges, unspecified: Secondary | ICD-10-CM | POA: Diagnosis not present

## 2017-06-10 DIAGNOSIS — F0280 Dementia in other diseases classified elsewhere without behavioral disturbance: Secondary | ICD-10-CM | POA: Diagnosis not present

## 2017-06-10 DIAGNOSIS — G300 Alzheimer's disease with early onset: Secondary | ICD-10-CM | POA: Diagnosis not present

## 2017-06-10 DIAGNOSIS — F17209 Nicotine dependence, unspecified, with unspecified nicotine-induced disorders: Secondary | ICD-10-CM | POA: Diagnosis not present

## 2017-06-10 DIAGNOSIS — I480 Paroxysmal atrial fibrillation: Secondary | ICD-10-CM | POA: Diagnosis not present

## 2017-06-17 ENCOUNTER — Other Ambulatory Visit: Payer: Self-pay | Admitting: Specialist

## 2017-06-17 DIAGNOSIS — J449 Chronic obstructive pulmonary disease, unspecified: Secondary | ICD-10-CM

## 2017-07-04 ENCOUNTER — Ambulatory Visit: Payer: PPO

## 2017-07-09 ENCOUNTER — Ambulatory Visit
Admission: RE | Admit: 2017-07-09 | Discharge: 2017-07-09 | Disposition: A | Discharge status: Home / Self Care | Payer: PPO | Source: Ambulatory Visit | Attending: Specialist | Admitting: Specialist

## 2017-07-09 ENCOUNTER — Ambulatory Visit
Admission: RE | Admit: 2017-07-09 | Discharge: 2017-07-09 | Disposition: A | Discharge status: Home / Self Care | Payer: PPO | Source: Ambulatory Visit | Attending: Urology | Admitting: Urology

## 2017-07-09 ENCOUNTER — Encounter: Payer: Self-pay | Admitting: Urology

## 2017-07-09 ENCOUNTER — Ambulatory Visit (INDEPENDENT_AMBULATORY_CARE_PROVIDER_SITE_OTHER): Payer: PPO | Admitting: Urology

## 2017-07-09 ENCOUNTER — Telehealth: Payer: Self-pay

## 2017-07-09 VITALS — BP 131/77 | HR 90 | Ht 69.0 in | Wt 165.0 lb

## 2017-07-09 DIAGNOSIS — N200 Calculus of kidney: Secondary | ICD-10-CM | POA: Insufficient documentation

## 2017-07-09 DIAGNOSIS — R972 Elevated prostate specific antigen [PSA]: Secondary | ICD-10-CM

## 2017-07-09 DIAGNOSIS — I700 Atherosclerosis of aorta: Secondary | ICD-10-CM | POA: Diagnosis not present

## 2017-07-09 DIAGNOSIS — N401 Enlarged prostate with lower urinary tract symptoms: Secondary | ICD-10-CM | POA: Diagnosis not present

## 2017-07-09 DIAGNOSIS — E785 Hyperlipidemia, unspecified: Secondary | ICD-10-CM

## 2017-07-09 DIAGNOSIS — J449 Chronic obstructive pulmonary disease, unspecified: Secondary | ICD-10-CM | POA: Insufficient documentation

## 2017-07-09 DIAGNOSIS — J439 Emphysema, unspecified: Secondary | ICD-10-CM | POA: Diagnosis not present

## 2017-07-09 HISTORY — DX: Hyperlipidemia, unspecified: E78.5

## 2017-07-09 NOTE — Telephone Encounter (Signed)
Patient's wife was notified.  

## 2017-07-09 NOTE — Telephone Encounter (Signed)
-----   Message from Abbie Sons, MD sent at 07/09/2017  3:55 PM EST ----- KUB shows stable, nonobstructing renal calculi.

## 2017-07-09 NOTE — Progress Notes (Signed)
07/09/2017 9:21 AM   Russell Terry [birth year] [id number]  Referring provider: Maryland Pink, MD [address] [facility] [place], [place] [address]  Chief Complaint  Patient presents with  . Benign Prostatic Hypertrophy    HPI: 76 year old male previously followed by me for BPH with lower urinary tract symptoms, elevated PSA and nephrolithiasis.  He also has a history of renal cell carcinoma status post nephrectomy at [facility].  He remains on doxazosin, tamsulosin and Avodart.  He was last seen by me at [facility] in April 2018.  He does complain of increased urinary frequency which he states is bothersome.  Cystoscopy performed May 2018 remarkable for lateral lobe enlargement with an intravesical median lobe.  He denies dysuria or gross hematuria.  He has a history of elevated PSA with prior benign biopsy.  His most recent uncorrected PSAs have been stable in the mid 2 range.  He underwent a right laparoscopic nephrectomy at [facility] in 2015 for pathologic T1b renal cell carcinoma and states he has follow-up scheduled at [facility] in the near future.  He has nonobstructing left renal calculi which are currently being monitored.  He denies flank or abdominal pain.  He has no gross hematuria.    PMH: Past Medical History:  Diagnosis Date  . Atrial fibrillation ([place]) 12/11/2013  . BPH (benign prostatic hyperplasia)   . Bradycardia 12/11/2013  . Calculus of kidney 12/06/2013  . Cancer ([place])    rt kidney removed  . Chronic kidney disease    stones,rt kidney removed  . COPD (chronic obstructive pulmonary disease) ([place]) 12/11/2013  . DDD (degenerative disc disease), lumbar 12/13/2013  . Delirium   . Dementia   . Elevated prostate specific antigen (PSA) 03/03/2013  . Encephalopathy acute 07/22/2016  . Enlarged prostate with lower urinary tract symptoms (LUTS) 03/03/2013  . Essential hypertension 07/22/2016  . Hyperlipidemia, unspecified 07/09/2017  . Hypertension   .  Meningioma ([place]) 10/08/2016  . Parkinsonian features 10/08/2016  . Pleuritic chest pain 05/31/2014  . Renal cell carcinoma ([place]) 05/04/2016  . Renal cyst, acquired, left 04/22/2015    Surgical History: Past Surgical History:  Procedure Laterality Date  . BRAIN SURGERY  2017  . COLON SURGERY    . COLONOSCOPY WITH PROPOFOL N/A 12/31/2016   Procedure: COLONOSCOPY WITH PROPOFOL;  Surgeon: Lollie Sails, MD;  Location: [facility] ENDOSCOPY;  Service: Endoscopy;  Laterality: N/A;  . [place]    . LUMBAR LAMINECTOMY/DECOMPRESSION MICRODISCECTOMY N/A 10/18/2015   Procedure: LUMBAR LAMINECTOMY/DECOMPRESSION MICRODISCECTOMY 3 LEVELS;  Surgeon: Newman Pies, MD;  Location: [facility] NEURO ORS;  Service: Neurosurgery;  Laterality: N/A;  L23 L34 L45 laminectomy and foraminotomy  . NEPHRECTOMY     2012  . URETEROSCOPY WITH HOLMIUM LASER LITHOTRIPSY      Home Medications:  Allergies as of 07/09/2017      Reactions   Quinapril Cough   Ambien [zolpidem Tartrate] Other (See Comments)   Unspecified reaction   Fenofibrate Other (See Comments)   Unknown   Amoxicillin-pot Clavulanate Nausea And Vomiting   Morphine Nausea Only      Medication List        Accurate as of 07/09/17  9:21 AM. Always use your most recent med list.          aspirin EC 81 MG tablet Take 81 mg by mouth daily.   carbidopa-levodopa 25-100 MG tablet Commonly known as:  SINEMET IR Take 1 tablet by mouth 3 (three) times daily.  diltiazem 240 MG 24 hr capsule Commonly known as:  DILACOR XR Take 240 mg by mouth daily.   donepezil 10 MG tablet Commonly known as:  ARICEPT TAKE 1 TABLET BY MOUTH EVERY DAY AT NIGHT   doxazosin 4 MG tablet Commonly known as:  CARDURA Take 4 mg by mouth daily.   dutasteride 0.5 MG capsule Commonly known as:  AVODART Take 0.5 mg by mouth daily.   gabapentin 300 MG capsule Commonly known as:  NEURONTIN Take 300-600 mg by mouth 3 (three) times daily. 300mg  in the morning,  300mg  in the afternoon, 600mg  in the evening   Melatonin 3 MG Tabs Take by mouth.   polyethylene glycol packet Commonly known as:  MIRALAX / GLYCOLAX   tamsulosin 0.4 MG Caps capsule Commonly known as:  FLOMAX Take 2 capsules (0.8 mg total) by mouth daily.   traZODone 50 MG tablet Commonly known as:  DESYREL Take 150 mg by mouth at bedtime.       Allergies:  Allergies  Allergen Reactions  . Quinapril Cough  . Ambien [Zolpidem Tartrate] Other (See Comments)    Unspecified reaction  . Fenofibrate Other (See Comments)    Unknown  . Amoxicillin-Pot Clavulanate Nausea And Vomiting  . Morphine Nausea Only    Family History: Family History  Problem Relation Age of Onset  . Bladder Cancer Neg Hx   . Kidney cancer Neg Hx   . Prolactinoma Neg Hx   . Prostate cancer Neg Hx     Social History:  reports that he has been smoking.  He has a 50.00 pack-year smoking history. he has never used smokeless tobacco. He reports that he does not drink alcohol or use drugs.  ROS: UROLOGY Frequent Urination?: Yes Hard to postpone urination?: Yes Burning/pain with urination?: No Get up at night to urinate?: Yes Leakage of urine?: Yes Urine stream starts and stops?: No Trouble starting stream?: No Do you have to strain to urinate?: No Blood in urine?: No Urinary tract infection?: No Sexually transmitted disease?: No Injury to kidneys or bladder?: No Painful intercourse?: No Weak stream?: No Erection problems?: No Penile pain?: No  Gastrointestinal Nausea?: No Vomiting?: No Indigestion/heartburn?: No Diarrhea?: No Constipation?: No  Constitutional Fever: No Night sweats?: No Weight loss?: No Fatigue?: No  Skin Skin rash/lesions?: No Itching?: No  Eyes Blurred vision?: No Double vision?: No  Ears/Nose/Throat Sore throat?: No Sinus problems?: No  Hematologic/Lymphatic Swollen glands?: No Easy bruising?: No  Cardiovascular Leg swelling?: No Chest pain?:  No  Respiratory Cough?: No Shortness of breath?: No  Endocrine Excessive thirst?: No  Musculoskeletal Back pain?: No Joint pain?: No  Neurological Headaches?: No Dizziness?: No  Psychologic Depression?: No Anxiety?: No  Physical Exam: BP 131/77   Pulse 90   Ht 5\' 9"  (1.753 m)   Wt 165 lb (74.8 kg)   BMI 24.37 kg/m   Constitutional:  Alert and oriented, No acute distress. HEENT: [place] AT, moist mucus membranes.  Trachea midline, no masses. Cardiovascular: No clubbing, cyanosis, or edema. Respiratory: Normal respiratory effort, no increased work of breathing. GI: Abdomen is soft, nontender, nondistended, no abdominal masses GU: No CVA tenderness. Prostate 60 g, smooth without nodules Skin: No rashes, bruises or suspicious lesions. Lymph: No cervical or inguinal adenopathy. Neurologic: Grossly intact, no focal deficits, moving all 4 extremities. Psychiatric: Normal mood and affect.  Laboratory Data: Lab Results  Component Value Date   WBC 6.3 08/08/2016   HGB 11.9 (L) 08/08/2016   HCT 35.7 (L)  08/08/2016   MCV 87.0 08/08/2016   PLT 268 08/08/2016    Lab Results  Component Value Date   CREATININE 1.12 08/08/2016     Assessment & Plan:   1. Benign prostatic hyperplasia with lower urinary tract symptoms, symptom details unspecified He has noticed some increased urinary frequency.  He is on tamsulosin and dutasteride.  He was on for hypertension and felt he did better with the addition of the tamsulosin.  He was interested in a trial of Myrbetriq and given samples.  Will call back regarding efficacy.  Follow-up 6 months  2. Elevated PSA Benign DRE.  PSA drawn today  3. Nephrolithiasis KUB ordered.  Will be notified with results.    Abbie Sons, [place] [address], [place], [place] [address] [phone]

## 2017-07-10 ENCOUNTER — Telehealth: Payer: Self-pay

## 2017-07-10 LAB — PSA: Prostate Specific Ag, Serum: 2.2 ng/mL (ref 0.0–4.0)

## 2017-07-10 NOTE — Telephone Encounter (Signed)
-----   Message from Abbie Sons, MD sent at 07/10/2017  8:00 AM EST ----- PSA stable at 2.2

## 2017-07-10 NOTE — Telephone Encounter (Signed)
Patients wife notified

## 2017-08-19 ENCOUNTER — Telehealth: Payer: Self-pay | Admitting: Urology

## 2017-08-19 NOTE — Telephone Encounter (Signed)
Pt wife called stating that the pharmacy ([place]) told her that we refused to refill patients Flomax.  Mr. Russell Terry needs and asks for a refill please. Pt tried the Myrbetriq for one month, didn't notice any difference and wants to continue Flomax. Pt is out.  Please Advise [name], his wife. Thanks.

## 2017-08-21 MED ORDER — TAMSULOSIN HCL 0.4 MG PO CAPS: 0.8000 mg | ORAL_CAPSULE | Freq: Every day | ORAL | 3 refills | Status: DC

## 2017-08-21 NOTE — Telephone Encounter (Signed)
Medication was sent to pharmacy.

## 2017-09-02 DIAGNOSIS — H43813 Vitreous degeneration, bilateral: Secondary | ICD-10-CM | POA: Diagnosis not present

## 2017-09-29 DIAGNOSIS — G308 Other Alzheimer's disease: Secondary | ICD-10-CM | POA: Diagnosis not present

## 2017-09-29 DIAGNOSIS — D329 Benign neoplasm of meninges, unspecified: Secondary | ICD-10-CM | POA: Diagnosis not present

## 2017-09-29 DIAGNOSIS — I10 Essential (primary) hypertension: Secondary | ICD-10-CM | POA: Diagnosis not present

## 2017-09-29 DIAGNOSIS — I4891 Unspecified atrial fibrillation: Secondary | ICD-10-CM | POA: Diagnosis not present

## 2017-09-29 DIAGNOSIS — G4700 Insomnia, unspecified: Secondary | ICD-10-CM | POA: Diagnosis not present

## 2017-09-29 DIAGNOSIS — R259 Unspecified abnormal involuntary movements: Secondary | ICD-10-CM | POA: Diagnosis not present

## 2017-09-29 DIAGNOSIS — F411 Generalized anxiety disorder: Secondary | ICD-10-CM | POA: Diagnosis not present

## 2017-09-29 DIAGNOSIS — N390 Urinary tract infection, site not specified: Secondary | ICD-10-CM | POA: Diagnosis not present

## 2017-09-29 DIAGNOSIS — R5383 Other fatigue: Secondary | ICD-10-CM | POA: Diagnosis not present

## 2017-09-29 DIAGNOSIS — F0280 Dementia in other diseases classified elsewhere without behavioral disturbance: Secondary | ICD-10-CM | POA: Diagnosis not present

## 2017-09-29 DIAGNOSIS — E559 Vitamin D deficiency, unspecified: Secondary | ICD-10-CM | POA: Diagnosis not present

## 2017-09-29 DIAGNOSIS — E538 Deficiency of other specified B group vitamins: Secondary | ICD-10-CM | POA: Diagnosis not present

## 2017-09-30 DIAGNOSIS — F0280 Dementia in other diseases classified elsewhere without behavioral disturbance: Secondary | ICD-10-CM | POA: Diagnosis not present

## 2017-09-30 DIAGNOSIS — R5383 Other fatigue: Secondary | ICD-10-CM | POA: Diagnosis not present

## 2017-09-30 DIAGNOSIS — N390 Urinary tract infection, site not specified: Secondary | ICD-10-CM | POA: Diagnosis not present

## 2017-09-30 DIAGNOSIS — F411 Generalized anxiety disorder: Secondary | ICD-10-CM | POA: Diagnosis not present

## 2017-09-30 DIAGNOSIS — G308 Other Alzheimer's disease: Secondary | ICD-10-CM | POA: Diagnosis not present

## 2017-09-30 DIAGNOSIS — E559 Vitamin D deficiency, unspecified: Secondary | ICD-10-CM | POA: Diagnosis not present

## 2017-09-30 DIAGNOSIS — G4700 Insomnia, unspecified: Secondary | ICD-10-CM | POA: Diagnosis not present

## 2017-09-30 DIAGNOSIS — D329 Benign neoplasm of meninges, unspecified: Secondary | ICD-10-CM | POA: Diagnosis not present

## 2017-09-30 DIAGNOSIS — R259 Unspecified abnormal involuntary movements: Secondary | ICD-10-CM | POA: Diagnosis not present

## 2017-09-30 DIAGNOSIS — E538 Deficiency of other specified B group vitamins: Secondary | ICD-10-CM | POA: Diagnosis not present

## 2017-09-30 DIAGNOSIS — I4891 Unspecified atrial fibrillation: Secondary | ICD-10-CM | POA: Diagnosis not present

## 2017-10-08 DIAGNOSIS — E538 Deficiency of other specified B group vitamins: Secondary | ICD-10-CM | POA: Diagnosis not present

## 2017-10-08 DIAGNOSIS — Z23 Encounter for immunization: Secondary | ICD-10-CM | POA: Diagnosis not present

## 2017-10-08 DIAGNOSIS — F17200 Nicotine dependence, unspecified, uncomplicated: Secondary | ICD-10-CM | POA: Diagnosis not present

## 2017-10-08 DIAGNOSIS — N3281 Overactive bladder: Secondary | ICD-10-CM | POA: Diagnosis not present

## 2017-10-08 DIAGNOSIS — F329 Major depressive disorder, single episode, unspecified: Secondary | ICD-10-CM | POA: Diagnosis not present

## 2017-10-08 DIAGNOSIS — Z0000 Encounter for general adult medical examination without abnormal findings: Secondary | ICD-10-CM | POA: Diagnosis not present

## 2017-10-16 DIAGNOSIS — E538 Deficiency of other specified B group vitamins: Secondary | ICD-10-CM | POA: Diagnosis not present

## 2017-10-30 DIAGNOSIS — E538 Deficiency of other specified B group vitamins: Secondary | ICD-10-CM | POA: Diagnosis not present

## 2017-11-12 DIAGNOSIS — I10 Essential (primary) hypertension: Secondary | ICD-10-CM | POA: Diagnosis not present

## 2017-11-19 ENCOUNTER — Telehealth: Payer: Self-pay | Admitting: Urology

## 2017-11-19 ENCOUNTER — Other Ambulatory Visit: Payer: Self-pay

## 2017-11-19 DIAGNOSIS — Z87440 Personal history of urinary (tract) infections: Secondary | ICD-10-CM | POA: Diagnosis not present

## 2017-11-19 DIAGNOSIS — R8290 Unspecified abnormal findings in urine: Secondary | ICD-10-CM | POA: Diagnosis not present

## 2017-11-19 DIAGNOSIS — E538 Deficiency of other specified B group vitamins: Secondary | ICD-10-CM | POA: Diagnosis not present

## 2017-11-19 DIAGNOSIS — F329 Major depressive disorder, single episode, unspecified: Secondary | ICD-10-CM | POA: Diagnosis not present

## 2017-11-19 DIAGNOSIS — J40 Bronchitis, not specified as acute or chronic: Secondary | ICD-10-CM | POA: Diagnosis not present

## 2017-11-19 MED ORDER — TAMSULOSIN HCL 0.4 MG PO CAPS: 0.8000 mg | ORAL_CAPSULE | Freq: Two times a day (BID) | ORAL | 3 refills | Status: DC

## 2017-11-19 NOTE — Telephone Encounter (Signed)
Pt called about Flomax refill being called in to [facility].  He takes 2 per day and is almost out.

## 2017-11-19 NOTE — Telephone Encounter (Signed)
Called and informed pt that refills were sent in back in February, he needs to call the pharmacy to have them filled.

## 2017-11-20 DIAGNOSIS — R8290 Unspecified abnormal findings in urine: Secondary | ICD-10-CM | POA: Diagnosis not present

## 2017-12-18 DIAGNOSIS — E538 Deficiency of other specified B group vitamins: Secondary | ICD-10-CM | POA: Diagnosis not present

## 2017-12-26 DIAGNOSIS — G300 Alzheimer's disease with early onset: Secondary | ICD-10-CM | POA: Diagnosis not present

## 2017-12-26 DIAGNOSIS — R001 Bradycardia, unspecified: Secondary | ICD-10-CM | POA: Diagnosis not present

## 2017-12-26 DIAGNOSIS — J449 Chronic obstructive pulmonary disease, unspecified: Secondary | ICD-10-CM | POA: Diagnosis not present

## 2017-12-26 DIAGNOSIS — E785 Hyperlipidemia, unspecified: Secondary | ICD-10-CM | POA: Diagnosis not present

## 2017-12-26 DIAGNOSIS — F17209 Nicotine dependence, unspecified, with unspecified nicotine-induced disorders: Secondary | ICD-10-CM | POA: Diagnosis not present

## 2017-12-26 DIAGNOSIS — F0280 Dementia in other diseases classified elsewhere without behavioral disturbance: Secondary | ICD-10-CM | POA: Diagnosis not present

## 2017-12-26 DIAGNOSIS — I10 Essential (primary) hypertension: Secondary | ICD-10-CM | POA: Diagnosis not present

## 2017-12-26 DIAGNOSIS — I480 Paroxysmal atrial fibrillation: Secondary | ICD-10-CM | POA: Diagnosis not present

## 2017-12-30 DIAGNOSIS — E538 Deficiency of other specified B group vitamins: Secondary | ICD-10-CM | POA: Diagnosis not present

## 2017-12-31 DIAGNOSIS — J40 Bronchitis, not specified as acute or chronic: Secondary | ICD-10-CM | POA: Diagnosis not present

## 2018-01-07 ENCOUNTER — Ambulatory Visit: Payer: PPO | Admitting: Urology

## 2018-01-07 ENCOUNTER — Encounter: Payer: Self-pay | Admitting: Urology

## 2018-01-07 VITALS — BP 91/60 | HR 74 | Ht 69.0 in | Wt 165.8 lb

## 2018-01-07 DIAGNOSIS — N200 Calculus of kidney: Secondary | ICD-10-CM | POA: Diagnosis not present

## 2018-01-07 DIAGNOSIS — N401 Enlarged prostate with lower urinary tract symptoms: Secondary | ICD-10-CM | POA: Diagnosis not present

## 2018-01-07 DIAGNOSIS — Z85528 Personal history of other malignant neoplasm of kidney: Secondary | ICD-10-CM

## 2018-01-07 DIAGNOSIS — R350 Frequency of micturition: Secondary | ICD-10-CM | POA: Diagnosis not present

## 2018-01-07 DIAGNOSIS — R972 Elevated prostate specific antigen [PSA]: Secondary | ICD-10-CM | POA: Diagnosis not present

## 2018-01-08 ENCOUNTER — Encounter: Payer: Self-pay | Admitting: Urology

## 2018-01-08 DIAGNOSIS — Z85528 Personal history of other malignant neoplasm of kidney: Secondary | ICD-10-CM | POA: Insufficient documentation

## 2018-01-08 NOTE — Progress Notes (Signed)
01/07/2018 7:44 AM   Russell Terry 06/30/1941 [id number]  Referring provider: Maryland Pink, MD [address] [facility] [place], [place] [address]  Chief Complaint  Patient presents with  . Follow-up   Urologic problem list: -BPH with lower urinary tract symptoms; on dutasteride, doxazosin and tamsulosin; cystoscopy with trilobar enlargement/intravesical median lobe  -History elevated PSA with benign prostate biopsy  -Recurrent nephrolithiasis; lower pole calculi  - T1b renal cell carcinoma status post right laparoscopic nephrectomy [facility] 6629  HPI: 77 year old male presents for a semiannual follow-up. I last saw him on 07/09/2017.  He was having increased urinary frequency at that time and was given a trial of Myrbetriq however he apparently stopped his tamsulosin when taking this medication.  He also complains of nocturia 4-6 times per night.  Denies dysuria or gross hematuria.  Denies flank, abdominal, pelvic or scrotal pain.  Last upper tract imaging was a renal ultrasound in April 2018.  There were small low-density areas too small to characterize but most likely represent cysts.  He has nonobstructing left lower pole calculi.   PMH: Past Medical History:  Diagnosis Date  . Atrial fibrillation ([place]) 12/11/2013  . BPH (benign prostatic hyperplasia)   . Bradycardia 12/11/2013  . Calculus of kidney 12/06/2013  . Cancer ([place])    rt kidney removed  . Chronic kidney disease    stones,rt kidney removed  . COPD (chronic obstructive pulmonary disease) ([place]) 12/11/2013  . DDD (degenerative disc disease), lumbar 12/13/2013  . Delirium   . Dementia   . Elevated prostate specific antigen (PSA) 03/03/2013  . Encephalopathy acute 07/22/2016  . Enlarged prostate with lower urinary tract symptoms (LUTS) 03/03/2013  . Essential hypertension 07/22/2016  . Hyperlipidemia, unspecified 07/09/2017  . Hypertension   . Meningioma ([place]) 10/08/2016  . Parkinsonian features  10/08/2016  . Pleuritic chest pain 05/31/2014  . Renal cell carcinoma ([place]) 05/04/2016  . Renal cyst, acquired, left 04/22/2015    Surgical History: Past Surgical History:  Procedure Laterality Date  . BRAIN SURGERY  2017  . COLON SURGERY    . COLONOSCOPY WITH PROPOFOL N/A 12/31/2016   Procedure: COLONOSCOPY WITH PROPOFOL;  Surgeon: Lollie Sails, MD;  Location: [facility] ENDOSCOPY;  Service: Endoscopy;  Laterality: N/A;  . [place]    . LUMBAR LAMINECTOMY/DECOMPRESSION MICRODISCECTOMY N/A 10/18/2015   Procedure: LUMBAR LAMINECTOMY/DECOMPRESSION MICRODISCECTOMY 3 LEVELS;  Surgeon: Newman Pies, MD;  Location: [facility] NEURO ORS;  Service: Neurosurgery;  Laterality: N/A;  L23 L34 L45 laminectomy and foraminotomy  . NEPHRECTOMY     2012  . URETEROSCOPY WITH HOLMIUM LASER LITHOTRIPSY      Home Medications:  Allergies as of 01/07/2018      Reactions   Quinapril Cough   Ambien [zolpidem Tartrate] Other (See Comments)   Unspecified reaction   Fenofibrate Other (See Comments)   Unknown   Zolpidem    Other reaction(s): Other (See Comments) Unspecified reaction   Amoxicillin-pot Clavulanate Nausea And Vomiting   Morphine Nausea Only      Medication List        Accurate as of 01/07/18 11:59 PM. Always use your most recent med list.          aspirin EC 81 MG tablet Take 81 mg by mouth daily.   azithromycin 500 MG tablet Commonly known as:  ZITHROMAX Take 500 mg by mouth daily.   BENADRYL ALLERGY PO Take by mouth.   carbidopa-levodopa 25-100 MG tablet Commonly known as:  SINEMET IR  Take 1 tablet by mouth 3 (three) times daily.   diltiazem 240 MG 24 hr capsule Commonly known as:  CARDIZEM CD TAKE 1 CAPSULE (240 MG TOTAL) BY MOUTH EVERY MORNING.   donepezil 10 MG tablet Commonly known as:  ARICEPT TAKE 1 TABLET BY MOUTH EVERY DAY AT NIGHT   doxazosin 4 MG tablet Commonly known as:  CARDURA Take 4 mg by mouth daily.   dutasteride 0.5 MG  capsule Commonly known as:  AVODART Take 0.5 mg by mouth daily.   gabapentin 300 MG capsule Commonly known as:  NEURONTIN Take 300-600 mg by mouth 3 (three) times daily. 300mg  in the morning, 300mg  in the afternoon, 600mg  in the evening   levofloxacin 500 MG tablet Commonly known as:  LEVAQUIN Take 500 mg by mouth daily.   Melatonin 3 MG Tabs Take by mouth.   Omeprazole 20 MG Tbec Take by mouth.   polyethylene glycol packet Commonly known as:  MIRALAX / GLYCOLAX   tamsulosin 0.4 MG Caps capsule Commonly known as:  FLOMAX Take 2 capsules (0.8 mg total) by mouth 2 (two) times daily.   traZODone 50 MG tablet Commonly known as:  DESYREL Take 150 mg by mouth at bedtime.   venlafaxine XR 37.5 MG 24 hr capsule Commonly known as:  EFFEXOR-XR TAKE 1 CAPSULE (37.5 MG TOTAL) BY MOUTH ONCE DAILY   vitamin B-12 1000 MCG tablet Commonly known as:  CYANOCOBALAMIN Take by mouth.       Allergies:  Allergies  Allergen Reactions  . Quinapril Cough  . Ambien [Zolpidem Tartrate] Other (See Comments)    Unspecified reaction  . Fenofibrate Other (See Comments)    Unknown  . Zolpidem     Other reaction(s): Other (See Comments) Unspecified reaction  . Amoxicillin-Pot Clavulanate Nausea And Vomiting  . Morphine Nausea Only    Family History: Family History  Problem Relation Age of Onset  . Bladder Cancer Neg Hx   . Kidney cancer Neg Hx   . Prolactinoma Neg Hx   . Prostate cancer Neg Hx     Social History:  reports that he has been smoking.  He has a 50.00 pack-year smoking history. He has never used smokeless tobacco. He reports that he does not drink alcohol or use drugs.  ROS: UROLOGY Frequent Urination?: Yes Hard to postpone urination?: Yes Burning/pain with urination?: No Get up at night to urinate?: Yes Leakage of urine?: Yes Urine stream starts and stops?: No Trouble starting stream?: No Do you have to strain to urinate?: No Blood in urine?: No Urinary tract  infection?: No Sexually transmitted disease?: No Injury to kidneys or bladder?: No Painful intercourse?: No Weak stream?: Yes Erection problems?: No Penile pain?: No  Gastrointestinal Nausea?: No Vomiting?: No Indigestion/heartburn?: No Diarrhea?: No Constipation?: No  Constitutional Fever: No Night sweats?: No Weight loss?: No Fatigue?: Yes  Skin Skin rash/lesions?: No Itching?: No  Eyes Blurred vision?: No Double vision?: No  Ears/Nose/Throat Sore throat?: No Sinus problems?: No  Hematologic/Lymphatic Swollen glands?: No Easy bruising?: No  Cardiovascular Leg swelling?: No Chest pain?: No  Respiratory Cough?: Yes Shortness of breath?: Yes  Endocrine Excessive thirst?: No  Musculoskeletal Back pain?: Yes Joint pain?: No  Neurological Headaches?: No Dizziness?: Yes  Psychologic Depression?: No Anxiety?: No  Physical Exam: BP 91/60 (BP Location: Left Arm, Patient Position: Sitting, Cuff Size: Large)   Pulse 74   Ht 5\' 9"  (1.753 m)   Wt 165 lb 12.8 oz (75.2 kg)   BMI 24.48  kg/m    Constitutional:  Alert and oriented, No acute distress. HEENT: [place] AT, moist mucus membranes.  Trachea midline, no masses. Cardiovascular: No clubbing, cyanosis, or edema. Respiratory: Normal respiratory effort, no increased work of breathing. GI: Abdomen is soft, nontender, nondistended, no abdominal masses GU: No CVA tenderness Lymph: No cervical or inguinal lymphadenopathy. Skin: No rashes, bruises or suspicious lesions. Neurologic: Grossly intact, no focal deficits, moving all 4 extremities. Psychiatric: Normal mood and affect.   Assessment & Plan:   77 year old male with lower urinary tract symptoms, history of renal cell carcinoma and nephrolithiasis.  At present his most bothersome voiding symptom is nocturia.  His wife states he does snore and I discussed obtaining a sleep study however he states he would not wear a CPAP.  Newer formulations of  desmopressin were discussed but would most likely be cost prohibitive.  He did want to try Myrbetriq again and will continue his alpha-blocker at this time.  If effective he will call back for an Rx.  We will obtain a follow-up renal ultrasound for assessment of his nephrolithiasis and remaining kidney.  Return in about 6 months (around 07/09/2018) for Recheck, PSA.   Abbie Sons, [place] [address] [place], [place] [address] [phone]

## 2018-01-14 ENCOUNTER — Ambulatory Visit
Admission: RE | Admit: 2018-01-14 | Discharge: 2018-01-14 | Disposition: A | Discharge status: Home / Self Care | Payer: PPO | Source: Ambulatory Visit | Attending: Urology | Admitting: Urology

## 2018-01-14 DIAGNOSIS — Z905 Acquired absence of kidney: Secondary | ICD-10-CM | POA: Insufficient documentation

## 2018-01-14 DIAGNOSIS — N400 Enlarged prostate without lower urinary tract symptoms: Secondary | ICD-10-CM | POA: Insufficient documentation

## 2018-01-14 DIAGNOSIS — N200 Calculus of kidney: Secondary | ICD-10-CM | POA: Diagnosis not present

## 2018-01-19 ENCOUNTER — Telehealth: Payer: Self-pay

## 2018-01-19 NOTE — Telephone Encounter (Signed)
-----   Message from Abbie Sons, MD sent at 01/16/2018 11:36 AM EDT ----- RUS shows stable calculi; no mass or hydronephrosis

## 2018-01-19 NOTE — Telephone Encounter (Signed)
Left detailed message on [name] phone.

## 2018-01-30 ENCOUNTER — Telehealth: Payer: Self-pay | Admitting: Urology

## 2018-01-30 NOTE — Telephone Encounter (Signed)
Patient is asking for a 90 day supply of [facility]betri[facility].  Please contact the patient when this is done.   Sharyn Lull

## 2018-02-01 MED ORDER — MIRABEGRON ER 50 MG PO TB24: 50.0000 mg | ORAL_TABLET | Freq: Every day | ORAL | 3 refills | Status: DC

## 2018-02-01 NOTE — Telephone Encounter (Signed)
Rx was sent  

## 2018-02-02 DIAGNOSIS — M545 Low back pain: Secondary | ICD-10-CM | POA: Diagnosis not present

## 2018-02-02 DIAGNOSIS — K625 Hemorrhage of anus and rectum: Secondary | ICD-10-CM | POA: Diagnosis not present

## 2018-02-02 DIAGNOSIS — G8929 Other chronic pain: Secondary | ICD-10-CM | POA: Diagnosis not present

## 2018-02-02 DIAGNOSIS — R350 Frequency of micturition: Secondary | ICD-10-CM | POA: Diagnosis not present

## 2018-02-02 DIAGNOSIS — N401 Enlarged prostate with lower urinary tract symptoms: Secondary | ICD-10-CM | POA: Diagnosis not present

## 2018-02-03 DIAGNOSIS — J439 Emphysema, unspecified: Secondary | ICD-10-CM | POA: Diagnosis not present

## 2018-02-03 DIAGNOSIS — C641 Malignant neoplasm of right kidney, except renal pelvis: Secondary | ICD-10-CM | POA: Diagnosis not present

## 2018-02-03 DIAGNOSIS — R0609 Other forms of dyspnea: Secondary | ICD-10-CM | POA: Diagnosis not present

## 2018-02-04 DIAGNOSIS — D2262 Melanocytic nevi of left upper limb, including shoulder: Secondary | ICD-10-CM | POA: Diagnosis not present

## 2018-02-04 DIAGNOSIS — D2272 Melanocytic nevi of left lower limb, including hip: Secondary | ICD-10-CM | POA: Diagnosis not present

## 2018-02-04 DIAGNOSIS — L821 Other seborrheic keratosis: Secondary | ICD-10-CM | POA: Diagnosis not present

## 2018-02-04 DIAGNOSIS — Z85828 Personal history of other malignant neoplasm of skin: Secondary | ICD-10-CM | POA: Diagnosis not present

## 2018-02-04 DIAGNOSIS — D225 Melanocytic nevi of trunk: Secondary | ICD-10-CM | POA: Diagnosis not present

## 2018-02-04 DIAGNOSIS — Z08 Encounter for follow-up examination after completed treatment for malignant neoplasm: Secondary | ICD-10-CM | POA: Diagnosis not present

## 2018-02-04 DIAGNOSIS — D692 Other nonthrombocytopenic purpura: Secondary | ICD-10-CM | POA: Diagnosis not present

## 2018-02-04 DIAGNOSIS — D485 Neoplasm of uncertain behavior of skin: Secondary | ICD-10-CM | POA: Diagnosis not present

## 2018-02-04 DIAGNOSIS — D2261 Melanocytic nevi of right upper limb, including shoulder: Secondary | ICD-10-CM | POA: Diagnosis not present

## 2018-02-04 DIAGNOSIS — C4441 Basal cell carcinoma of skin of scalp and neck: Secondary | ICD-10-CM | POA: Diagnosis not present

## 2018-02-05 DIAGNOSIS — D329 Benign neoplasm of meninges, unspecified: Secondary | ICD-10-CM | POA: Diagnosis not present

## 2018-02-05 DIAGNOSIS — G20 Parkinson's disease: Secondary | ICD-10-CM | POA: Diagnosis not present

## 2018-02-05 DIAGNOSIS — F411 Generalized anxiety disorder: Secondary | ICD-10-CM | POA: Diagnosis not present

## 2018-02-05 DIAGNOSIS — F0280 Dementia in other diseases classified elsewhere without behavioral disturbance: Secondary | ICD-10-CM | POA: Diagnosis not present

## 2018-02-05 DIAGNOSIS — G4700 Insomnia, unspecified: Secondary | ICD-10-CM | POA: Diagnosis not present

## 2018-02-11 DIAGNOSIS — D329 Benign neoplasm of meninges, unspecified: Secondary | ICD-10-CM | POA: Diagnosis not present

## 2018-02-11 DIAGNOSIS — F17210 Nicotine dependence, cigarettes, uncomplicated: Secondary | ICD-10-CM | POA: Diagnosis not present

## 2018-02-18 DIAGNOSIS — M47816 Spondylosis without myelopathy or radiculopathy, lumbar region: Secondary | ICD-10-CM | POA: Diagnosis not present

## 2018-02-18 DIAGNOSIS — M461 Sacroiliitis, not elsewhere classified: Secondary | ICD-10-CM | POA: Diagnosis not present

## 2018-02-19 DIAGNOSIS — R8290 Unspecified abnormal findings in urine: Secondary | ICD-10-CM | POA: Diagnosis not present

## 2018-02-19 DIAGNOSIS — Z87440 Personal history of urinary (tract) infections: Secondary | ICD-10-CM | POA: Diagnosis not present

## 2018-02-19 DIAGNOSIS — G20 Parkinson's disease: Secondary | ICD-10-CM | POA: Diagnosis not present

## 2018-02-19 DIAGNOSIS — E538 Deficiency of other specified B group vitamins: Secondary | ICD-10-CM | POA: Diagnosis not present

## 2018-02-19 DIAGNOSIS — I10 Essential (primary) hypertension: Secondary | ICD-10-CM | POA: Diagnosis not present

## 2018-02-19 DIAGNOSIS — J449 Chronic obstructive pulmonary disease, unspecified: Secondary | ICD-10-CM | POA: Diagnosis not present

## 2018-02-20 ENCOUNTER — Telehealth: Payer: Self-pay | Admitting: Urology

## 2018-02-20 DIAGNOSIS — R8290 Unspecified abnormal findings in urine: Secondary | ICD-10-CM | POA: Diagnosis not present

## 2018-02-20 NOTE — Telephone Encounter (Signed)
lmom asking for Flomax refill, states pt is out and has been trying to get refill 2 days. Please advise. Thank you =)

## 2018-02-23 MED ORDER — TAMSULOSIN HCL 0.4 MG PO CAPS: 0.8000 mg | ORAL_CAPSULE | Freq: Two times a day (BID) | ORAL | 3 refills | Status: DC

## 2018-02-23 NOTE — Addendum Note (Signed)
Addended by: Kyra Manges on: 02/23/2018 04:25 PM   Modules accepted: Orders

## 2018-02-25 DIAGNOSIS — G8929 Other chronic pain: Secondary | ICD-10-CM | POA: Diagnosis not present

## 2018-02-25 DIAGNOSIS — M533 Sacrococcygeal disorders, not elsewhere classified: Secondary | ICD-10-CM | POA: Diagnosis not present

## 2018-03-18 DIAGNOSIS — C4441 Basal cell carcinoma of skin of scalp and neck: Secondary | ICD-10-CM | POA: Diagnosis not present

## 2018-03-18 DIAGNOSIS — Z87440 Personal history of urinary (tract) infections: Secondary | ICD-10-CM | POA: Diagnosis not present

## 2018-03-23 DIAGNOSIS — E538 Deficiency of other specified B group vitamins: Secondary | ICD-10-CM | POA: Diagnosis not present

## 2018-03-26 ENCOUNTER — Other Ambulatory Visit: Payer: Self-pay | Admitting: Family Medicine

## 2018-03-26 DIAGNOSIS — R350 Frequency of micturition: Principal | ICD-10-CM

## 2018-03-26 DIAGNOSIS — N401 Enlarged prostate with lower urinary tract symptoms: Secondary | ICD-10-CM

## 2018-03-27 ENCOUNTER — Other Ambulatory Visit: Payer: PPO

## 2018-03-27 DIAGNOSIS — R350 Frequency of micturition: Secondary | ICD-10-CM | POA: Diagnosis not present

## 2018-03-27 DIAGNOSIS — N401 Enlarged prostate with lower urinary tract symptoms: Secondary | ICD-10-CM

## 2018-03-27 LAB — URINALYSIS, COMPLETE
BILIRUBIN UA: NEGATIVE
Glucose, UA: NEGATIVE
Ketones, UA: NEGATIVE
Leukocytes, UA: NEGATIVE
Nitrite, UA: POSITIVE — AB
RBC UA: NEGATIVE
Specific Gravity, UA: >1.03 — ABNORMAL HIGH (ref 1.005–1.030)
UUROB: 0.2 mg/dL (ref 0.2–1.0)
pH, UA: 6 (ref 5.0–7.5)

## 2018-03-27 LAB — MICROSCOPIC EXAMINATION: RBC, UA: NONE SEEN /hpf (ref 0–2)

## 2018-03-30 LAB — CULTURE, URINE COMPREHENSIVE

## 2018-04-01 ENCOUNTER — Telehealth: Payer: Self-pay

## 2018-04-01 NOTE — Telephone Encounter (Signed)
-----   Message from Abbie Sons, MD sent at 03/31/2018  7:14 AM EDT ----- I am not sure why this culture was ordered.  I do not see anything noted in the chart why it was ordered or if he was having symptoms

## 2018-04-01 NOTE — Telephone Encounter (Signed)
Is he having any UTI symptoms?

## 2018-04-01 NOTE — Telephone Encounter (Signed)
The appointment was for a lab visit and the appointment note states that per Morey Hummingbird it was okay for pt wife to bring in urine sample for recheck on uti status from Dr. Barbarann Ehlers referral.

## 2018-04-02 MED ORDER — LEVOFLOXACIN 500 MG PO TABS: 500.0000 mg | ORAL_TABLET | Freq: Every day | ORAL | 0 refills | Status: AC

## 2018-04-02 NOTE — Addendum Note (Signed)
Addended by: Garnette Gunner on: 04/02/2018 03:13 PM   Modules accepted: Orders

## 2018-04-02 NOTE — Telephone Encounter (Signed)
Left message for pt to call and notify us if he was having any sx's.

## 2018-04-02 NOTE — Telephone Encounter (Signed)
Per Dr. Bernardo Heater, pt to start Levaquin 500 daily for 7 days. He is only experiencing back pain and foul odor/color of urine. Pt was informed, [facility] was [facility].

## 2018-04-02 NOTE — Telephone Encounter (Signed)
Patient had a previous urinary tract infection, back pain, strong odor to urine.  Dr. Kary Kos sent patient over to provide a urine sample for UA and culture.

## 2018-04-03 ENCOUNTER — Telehealth: Payer: Self-pay | Admitting: Urology

## 2018-04-03 MED ORDER — CEFDINIR 300 MG PO CAPS: 300.0000 mg | ORAL_CAPSULE | Freq: Two times a day (BID) | ORAL | 0 refills | Status: DC

## 2018-04-03 NOTE — Telephone Encounter (Signed)
Rx Cefdinir sent

## 2018-04-03 NOTE — Addendum Note (Signed)
Addended by: Abbie Sons on: 04/03/2018 04:31 PM   Modules accepted: Orders

## 2018-04-03 NOTE — Telephone Encounter (Signed)
Please advise 

## 2018-04-03 NOTE — Telephone Encounter (Signed)
Pt's wife Wiliam Ke called to let us know Levaquin 500 mg is tearing pt's stomach up.  Pt has had diarrhea and he will need something different.  [facility] [address] [facility]  [place] If you need to talk with wife, please call her cell# [phone]

## 2018-04-28 DIAGNOSIS — E538 Deficiency of other specified B group vitamins: Secondary | ICD-10-CM | POA: Diagnosis not present

## 2018-04-28 DIAGNOSIS — R197 Diarrhea, unspecified: Secondary | ICD-10-CM | POA: Diagnosis not present

## 2018-04-28 DIAGNOSIS — N390 Urinary tract infection, site not specified: Secondary | ICD-10-CM | POA: Diagnosis not present

## 2018-04-28 DIAGNOSIS — R410 Disorientation, unspecified: Secondary | ICD-10-CM | POA: Diagnosis not present

## 2018-04-29 ENCOUNTER — Ambulatory Visit (INDEPENDENT_AMBULATORY_CARE_PROVIDER_SITE_OTHER): Payer: PPO | Admitting: Family Medicine

## 2018-04-29 ENCOUNTER — Encounter: Payer: Self-pay | Admitting: Family Medicine

## 2018-04-29 VITALS — BP 138/86 | HR 66 | Ht 69.0 in

## 2018-04-29 DIAGNOSIS — R350 Frequency of micturition: Secondary | ICD-10-CM | POA: Diagnosis not present

## 2018-04-29 DIAGNOSIS — R197 Diarrhea, unspecified: Secondary | ICD-10-CM | POA: Diagnosis not present

## 2018-04-29 LAB — URINALYSIS, COMPLETE
BILIRUBIN UA: NEGATIVE
GLUCOSE, UA: NEGATIVE
Nitrite, UA: POSITIVE — AB
RBC UA: NEGATIVE
SPEC GRAV UA: 1.025 (ref 1.005–1.030)
UUROB: 0.2 mg/dL (ref 0.2–1.0)
pH, UA: 6 (ref 5.0–7.5)

## 2018-04-29 LAB — MICROSCOPIC EXAMINATION: Bacteria, UA: NONE SEEN

## 2018-04-29 NOTE — Progress Notes (Signed)
Patient presents today with urinary frequency and flank pain. He completed the course of Levofloxacin. A urine was collected for UA, UCX. Patient has not had any Urological surgeries in the last 30 days. Larene Beach reviewed the UA results in office today. Per Larene Beach patient is to start taking Cefdinir (he already has this medication) and when the Culture comes back we will change the medication if needed.

## 2018-05-03 LAB — CULTURE, URINE COMPREHENSIVE

## 2018-05-29 DIAGNOSIS — E538 Deficiency of other specified B group vitamins: Secondary | ICD-10-CM | POA: Diagnosis not present

## 2018-06-22 DIAGNOSIS — G300 Alzheimer's disease with early onset: Secondary | ICD-10-CM | POA: Diagnosis not present

## 2018-06-22 DIAGNOSIS — R259 Unspecified abnormal involuntary movements: Secondary | ICD-10-CM | POA: Diagnosis not present

## 2018-06-22 DIAGNOSIS — F17209 Nicotine dependence, unspecified, with unspecified nicotine-induced disorders: Secondary | ICD-10-CM | POA: Diagnosis not present

## 2018-06-22 DIAGNOSIS — R001 Bradycardia, unspecified: Secondary | ICD-10-CM | POA: Diagnosis not present

## 2018-06-22 DIAGNOSIS — F0280 Dementia in other diseases classified elsewhere without behavioral disturbance: Secondary | ICD-10-CM | POA: Diagnosis not present

## 2018-06-22 DIAGNOSIS — I480 Paroxysmal atrial fibrillation: Secondary | ICD-10-CM | POA: Diagnosis not present

## 2018-06-22 DIAGNOSIS — J439 Emphysema, unspecified: Secondary | ICD-10-CM | POA: Diagnosis not present

## 2018-06-22 DIAGNOSIS — E785 Hyperlipidemia, unspecified: Secondary | ICD-10-CM | POA: Diagnosis not present

## 2018-06-22 DIAGNOSIS — I10 Essential (primary) hypertension: Secondary | ICD-10-CM | POA: Diagnosis not present

## 2018-07-13 DIAGNOSIS — E538 Deficiency of other specified B group vitamins: Secondary | ICD-10-CM | POA: Diagnosis not present

## 2018-07-20 ENCOUNTER — Ambulatory Visit: Payer: PPO | Admitting: Urology

## 2018-07-21 DIAGNOSIS — Z85828 Personal history of other malignant neoplasm of skin: Secondary | ICD-10-CM | POA: Diagnosis not present

## 2018-07-21 DIAGNOSIS — D225 Melanocytic nevi of trunk: Secondary | ICD-10-CM | POA: Diagnosis not present

## 2018-07-21 DIAGNOSIS — Z08 Encounter for follow-up examination after completed treatment for malignant neoplasm: Secondary | ICD-10-CM | POA: Diagnosis not present

## 2018-07-21 DIAGNOSIS — D2261 Melanocytic nevi of right upper limb, including shoulder: Secondary | ICD-10-CM | POA: Diagnosis not present

## 2018-07-21 DIAGNOSIS — D2262 Melanocytic nevi of left upper limb, including shoulder: Secondary | ICD-10-CM | POA: Diagnosis not present

## 2018-07-21 DIAGNOSIS — C44319 Basal cell carcinoma of skin of other parts of face: Secondary | ICD-10-CM | POA: Diagnosis not present

## 2018-07-21 DIAGNOSIS — D2272 Melanocytic nevi of left lower limb, including hip: Secondary | ICD-10-CM | POA: Diagnosis not present

## 2018-07-21 DIAGNOSIS — L821 Other seborrheic keratosis: Secondary | ICD-10-CM | POA: Diagnosis not present

## 2018-07-21 DIAGNOSIS — D485 Neoplasm of uncertain behavior of skin: Secondary | ICD-10-CM | POA: Diagnosis not present

## 2018-07-21 DIAGNOSIS — D2271 Melanocytic nevi of right lower limb, including hip: Secondary | ICD-10-CM | POA: Diagnosis not present

## 2018-07-27 ENCOUNTER — Other Ambulatory Visit: Payer: Self-pay | Admitting: Urology

## 2018-07-27 NOTE — Telephone Encounter (Signed)
Needs rx refill for Tamsulosin

## 2018-07-29 ENCOUNTER — Other Ambulatory Visit: Payer: Self-pay

## 2018-07-29 MED ORDER — TAMSULOSIN HCL 0.4 MG PO CAPS: 0.8000 mg | ORAL_CAPSULE | Freq: Two times a day (BID) | ORAL | 3 refills | Status: DC

## 2018-07-31 DIAGNOSIS — C44319 Basal cell carcinoma of skin of other parts of face: Secondary | ICD-10-CM | POA: Diagnosis not present

## 2018-08-04 DIAGNOSIS — G20 Parkinson's disease: Secondary | ICD-10-CM | POA: Diagnosis not present

## 2018-08-04 DIAGNOSIS — F0280 Dementia in other diseases classified elsewhere without behavioral disturbance: Secondary | ICD-10-CM | POA: Diagnosis not present

## 2018-08-04 DIAGNOSIS — G308 Other Alzheimer's disease: Secondary | ICD-10-CM | POA: Diagnosis not present

## 2018-08-04 DIAGNOSIS — I4891 Unspecified atrial fibrillation: Secondary | ICD-10-CM | POA: Diagnosis not present

## 2018-08-04 DIAGNOSIS — F411 Generalized anxiety disorder: Secondary | ICD-10-CM | POA: Diagnosis not present

## 2018-08-04 DIAGNOSIS — D329 Benign neoplasm of meninges, unspecified: Secondary | ICD-10-CM | POA: Diagnosis not present

## 2018-08-04 DIAGNOSIS — G4700 Insomnia, unspecified: Secondary | ICD-10-CM | POA: Diagnosis not present

## 2018-08-12 ENCOUNTER — Encounter: Payer: Self-pay | Admitting: Urology

## 2018-08-12 ENCOUNTER — Ambulatory Visit: Payer: PPO | Admitting: Urology

## 2018-08-12 VITALS — BP 131/74 | HR 80 | Ht 69.0 in | Wt 175.1 lb

## 2018-08-12 DIAGNOSIS — R350 Frequency of micturition: Secondary | ICD-10-CM | POA: Diagnosis not present

## 2018-08-12 DIAGNOSIS — R972 Elevated prostate specific antigen [PSA]: Secondary | ICD-10-CM | POA: Diagnosis not present

## 2018-08-12 DIAGNOSIS — E538 Deficiency of other specified B group vitamins: Secondary | ICD-10-CM | POA: Diagnosis not present

## 2018-08-12 DIAGNOSIS — N401 Enlarged prostate with lower urinary tract symptoms: Secondary | ICD-10-CM | POA: Diagnosis not present

## 2018-08-12 DIAGNOSIS — N200 Calculus of kidney: Secondary | ICD-10-CM | POA: Diagnosis not present

## 2018-08-12 NOTE — Patient Instructions (Signed)
Prostate Cancer Screening  The prostate is a walnut-sized gland that is located below the bladder and in front of the rectum in males. The function of the prostate (prostate gland) is to add fluid to semen during ejaculation. Prostate cancer is the second most common type of cancer in men. A screening test for cancer is a test that is done before cancer symptoms start. Screening can help to identify cancer at an early stage, when the cancer can be treated more easily. The recommended prostate cancer screening test is a blood test called the prostate-specific antigen (PSA) test. PSA is a protein that is made in the prostate. As you age, your prostate naturally produces more PSA. Abnormally high PSA levels may be caused by:  Prostate cancer.  An enlarged prostate that is not caused by cancer (benign prostatic hyperplasia, BPH). This condition is very common in older men.  A prostate gland infection (prostatitis).  Medicines to assist with hair growth, such as finasteride. Depending on the PSA results, you may need more tests, such as:  A physical exam to check the size of your prostate gland.  Blood and imaging tests.  A procedure to remove tissue samples from your prostate gland for testing (biopsy). Who should have screening? Screening recommendations vary based on age.  If you are younger than age 40, screening is not recommended.  If you are age 40-54 and you have no risk factors, screening is not recommended.  If you are younger than age 55, ask your health care provider if you need screening if you have one of these risk factors: ? Being of African-American descent. ? Having a family history of prostate cancer.  If you are age 55-69, talk with your health care provider about your need for screening and how often screening should be done.  If you are older than age 70, screening is not recommended. This is because the risks that screening can cause are greater than the benefits  that it may provide (risks outweigh the benefits). If you are at high risk for prostate cancer, your health care provider may recommend that you have screenings more often or start screening at a younger age. You may be at high risk if you:  Are older than age 55.  Are African-American.  Have a father, brother, or uncle who has been diagnosed with prostate cancer. The risk may be higher if your family member's cancer occurred at an early age. What are the benefits of screening? There is a small chance that screening may lower your risk of dying from prostate cancer. The chance is small because prostate cancer is typically a slow-growing cancer, and most men with prostate cancer die from a different cause. What are the risks of screening? The main risk of prostate cancer screening is diagnosing and treating prostate cancer that would never have caused any symptoms or problems (overdiagnosis and overtreatment). PSA screening cannot tell you if your PSA is high due to cancer or a different cause. A prostate biopsy is the only procedure to diagnose prostate cancer. Even the results of a biopsy may not tell you if your cancer needs to be treated. Slow-growing prostate cancer may not need any treatment other than monitoring, so diagnosing and treating it may cause unnecessary stress or other side effects. A prostate biopsy may also cause:  Infection or fever.  A false negative. This is a result that shows that you do not have prostate cancer when you actually do have prostate cancer. Questions   to ask your health care provider  When should I start prostate cancer screening?  What is my risk for prostate cancer?  How often do I need screening?  What type of screening tests do I need?  How do I get my test results?  What do my results mean?  Do I need treatment? Contact a health care provider if:  You have difficulty urinating.  You have pain when you urinate or ejaculate.  You have  blood in your urine or semen.  You have pain in your back or in the area of your prostate.  You have trouble getting or maintaining an erection (erectile dysfunction, ED). Summary  Prostate cancer is a common type of cancer in men. The prostate (prostate gland) is located below the bladder and in front of the rectum. This gland adds fluid to semen during ejaculation.  Prostate cancer screening may identify cancer at an early stage, when the cancer can be treated more easily.  The prostate-specific antigen (PSA) test is the recommended screening test for prostate cancer.  Discuss the risks and benefits of prostate cancer screening with your health care provider. If you are age 23 or older, screening is likely to lead to more risks than benefits (risks outweigh the benefits). This information is not intended to replace advice given to you by your health care provider. Make sure you discuss any questions you have with your health care provider. Document Released: 04/11/2017 Document Revised: 04/11/2017 Document Reviewed: 04/11/2017 Elsevier Interactive Patient Education  2019 [employer] Benign Prostatic Hyperplasia  Benign prostatic hyperplasia (BPH) is an enlarged prostate gland that is caused by the normal aging process and not by cancer. The prostate is a walnut-sized gland that is involved in the production of semen. It is located in front of the rectum and below the bladder. The bladder stores urine and the urethra is the tube that carries the urine out of the body. The prostate may get bigger as a man gets older. An enlarged prostate can press on the urethra. This can make it harder to pass urine. The build-up of urine in the bladder can cause infection. Back pressure and infection may progress to bladder damage and kidney (renal) failure. What are the causes? This condition is part of a normal aging process. However, not all men develop problems from this condition. If the prostate  enlarges away from the urethra, urine flow will not be blocked. If it enlarges toward the urethra and compresses it, there will be problems passing urine. What increases the risk? This condition is more likely to develop in men over the age of 77 years. What are the signs or symptoms? Symptoms of this condition include:  Getting up often during the night to urinate.  Needing to urinate frequently during the day.  Difficulty starting urine flow.  Decrease in size and strength of your urine stream.  Leaking (dribbling) after urinating.  Inability to pass urine. This needs immediate treatment.  Inability to completely empty your bladder.  Pain when you pass urine. This is more common if there is also an infection.  Urinary tract infection (UTI). How is this diagnosed? This condition is diagnosed based on your medical history, a physical exam, and your symptoms. Tests will also be done, such as:  A post-void bladder scan. This measures any amount of urine that may remain in your bladder after you finish urinating.  A digital rectal exam. In a rectal exam, your health care provider checks your prostate  by putting a lubricated, gloved finger into your rectum to feel the back of your prostate gland. This exam detects the size of your gland and any abnormal lumps or growths.  An exam of your urine (urinalysis).  A prostate specific antigen (PSA) screening. This is a blood test used to screen for prostate cancer.  An ultrasound. This test uses sound waves to electronically produce a picture of your prostate gland. Your health care provider may refer you to a specialist in kidney and prostate diseases (urologist). How is this treated? Once symptoms begin, your health care provider will monitor your condition (active surveillance or watchful waiting). Treatment for this condition will depend on the severity of your condition. Treatment may include:  Observation and yearly exams. This may  be the only treatment needed if your condition and symptoms are mild.  Medicines to relieve your symptoms, including: ? Medicines to shrink the prostate. ? Medicines to relax the muscle of the prostate.  Surgery in severe cases. Surgery may include: ? Prostatectomy. In this procedure, the prostate tissue is removed completely through an open incision or with a laparascope or robotics. ? Transurethral resection of the prostate (TURP). In this procedure, a tool is inserted through the opening at the tip of the penis (urethra). It is used to cut away tissue of the inner core of the prostate. The pieces are removed through the same opening of the penis. This removes the blockage. ? Transurethral incision (TUIP). In this procedure, small cuts are made in the prostate. This lessens the prostate's pressure on the urethra. ? Transurethral microwave thermotherapy (TUMT). This procedure uses microwaves to create heat. The heat destroys and removes a small amount of prostate tissue. ? Transurethral needle ablation (TUNA). This procedure uses radio frequencies to destroy and remove a small amount of prostate tissue. ? Interstitial laser coagulation ([place]). This procedure uses a laser to destroy and remove a small amount of prostate tissue. ? Transurethral electrovaporization (TUVP). This procedure uses electrodes to destroy and remove a small amount of prostate tissue. ? Prostatic urethral lift. This procedure inserts an implant to push the lobes of the prostate away from the urethra. Follow these instructions at home:  Take over-the-counter and prescription medicines only as told by your health care provider.  Monitor your symptoms for any changes. Contact your health care provider with any changes.  Avoid drinking large amounts of liquid before going to bed or out in public.  Avoid or reduce how much caffeine or alcohol you drink.  Give yourself time when you urinate.  Keep all follow-up visits as  told by your health care provider. This is important. Contact a health care provider if:  You have unexplained back pain.  Your symptoms do not get better with treatment.  You develop side effects from the medicine you are taking.  Your urine becomes very dark or has a bad smell.  Your lower abdomen becomes distended and you have trouble passing your urine. Get help right away if:  You have a fever or chills.  You suddenly cannot urinate.  You feel lightheaded, or very dizzy, or you faint.  There are large amounts of blood or clots in the urine.  Your urinary problems become hard to manage.  You develop moderate to severe low back or flank pain. The flank is the side of your body between the ribs and the hip. These symptoms may represent a serious problem that is an emergency. Do not wait to see if the  symptoms will go away. Get medical help right away. Call your local emergency services (911 in the [place] Do not drive yourself to the hospital. Summary  Benign prostatic hyperplasia (BPH) is an enlarged prostate that is caused by the normal aging process and not by cancer.  An enlarged prostate can press on the urethra. This can make it hard to pass urine.  This condition is part of a normal aging process and is more likely to develop in men over the age of 72 years.  Get help right away if you suddenly cannot urinate. This information is not intended to replace advice given to you by your health care provider. Make sure you discuss any questions you have with your health care provider. Document Released: 07/01/2005 Document Revised: 08/05/2016 Document Reviewed: 08/05/2016 Elsevier Interactive Patient Education  2019 [employer].

## 2018-08-12 NOTE — Progress Notes (Signed)
08/12/2018 8:52 AM   Russell Terry 1941-03-18 [id number]  Referring provider: Maryland Pink, MD [address] [facility] [place], [place] [address]  Chief Complaint  Patient presents with  . Benign Prostatic Hypertrophy  . Urinary Incontinence   Urologic history: -BPH with lower urinary tract symptoms; cystoscopy with trilobar enlargement/intravesical median lobe  -History elevated PSA with benign prostate biopsy  -Recurrent nephrolithiasis; lower pole calculi  - T1b renal cell carcinoma status post right laparoscopic nephrectomy [facility] 4353   HPI: 78 year old male presents for semiannual follow-up.  He was last seen June 2018 and was complaining of urinary frequency and urgency.  He had been on Myrbetriq but discontinued the tamsulosin.  He is presently on dutasteride, tamsulosin, doxazosin and Myrbetriq.  He is on the doxazosin for hypertension.  Overall his voiding pattern has improved however he still complains of frequency urgency with occasional episodes of urge incontinence.  Denies dysuria or gross hematuria.  A renal ultrasound performed July 2019 showed a 7 mm nonobstructing left lower pole calculus and a possible 7 mm upper pole left renal calculus.  Prior CT did not show an upper pole calculus.  His last PSA was in December 2018 and was 2.2 (uncorrected).   PMH: Past Medical History:  Diagnosis Date  . Atrial fibrillation ([place]) 12/11/2013  . BPH (benign prostatic hyperplasia)   . Bradycardia 12/11/2013  . Calculus of kidney 12/06/2013  . Cancer ([place])    rt kidney removed  . Chronic kidney disease    stones,rt kidney removed  . COPD (chronic obstructive pulmonary disease) ([place]) 12/11/2013  . DDD (degenerative disc disease), lumbar 12/13/2013  . Delirium   . Dementia ([place])   . Elevated prostate specific antigen (PSA) 03/03/2013  . Encephalopathy acute 07/22/2016  . Enlarged prostate with lower urinary tract symptoms (LUTS) 03/03/2013  .  Essential hypertension 07/22/2016  . Hyperlipidemia, unspecified 07/09/2017  . Hypertension   . Meningioma ([place]) 10/08/2016  . Parkinsonian features 10/08/2016  . Pleuritic chest pain 05/31/2014  . Renal cell carcinoma ([place]) 05/04/2016  . Renal cyst, acquired, left 04/22/2015    Surgical History: Past Surgical History:  Procedure Laterality Date  . BRAIN SURGERY  2017  . COLON SURGERY    . COLONOSCOPY WITH PROPOFOL N/A 12/31/2016   Procedure: COLONOSCOPY WITH PROPOFOL;  Surgeon: Lollie Sails, MD;  Location[facility] ENDOSCOPY;  Service: Endoscopy;  Laterality: N/A;  . [place]    . LUMBAR LAMINECTOMY/DECOMPRESSION MICRODISCECTOMY N/A 10/18/2015   Procedure: LUMBAR LAMINECTOMY/DECOMPRESSION MICRODISCECTOMY 3 LEVELS;  Surgeon: Newman Pies, MD;  Location: [facility] NEURO ORS;  Service: Neurosurgery;  Laterality: N/A;  L23 L34 L45 laminectomy and foraminotomy  . NEPHRECTOMY     2012  . URETEROSCOPY WITH HOLMIUM LASER LITHOTRIPSY      Home Medications:  Allergies as of 08/12/2018      Reactions   Quinapril Cough   Ambien [zolpidem Tartrate] Other (See Comments)   Unspecified reaction   Fenofibrate Other (See Comments)   Unknown   Zolpidem    Other reaction(s): Other (See Comments) Unspecified reaction   Amoxicillin-pot Clavulanate Nausea And Vomiting   Morphine Nausea Only      Medication List       Accurate as of August 12, 2018 11:59 PM. Always use your most recent med list.        aspirin EC 81 MG tablet Take 81 mg by mouth daily.   azithromycin 500 MG tablet Commonly known as:  ZITHROMAX Take  500 mg by mouth daily.   BENADRYL ALLERGY PO Take by mouth.   carbidopa-levodopa 25-100 MG tablet Commonly known as:  SINEMET IR Take 2 tablets by mouth 3 (three) times daily.   diltiazem 240 MG 24 hr capsule Commonly known as:  CARDIZEM CD TAKE 1 CAPSULE (240 MG TOTAL) BY MOUTH EVERY MORNING.   donepezil 10 MG tablet Commonly known as:  ARICEPT TAKE 1  TABLET BY MOUTH EVERY DAY AT NIGHT   doxazosin 4 MG tablet Commonly known as:  CARDURA Take 4 mg by mouth daily.   dutasteride 0.5 MG capsule Commonly known as:  AVODART Take 0.5 mg by mouth daily.   gabapentin 300 MG capsule Commonly known as:  NEURONTIN Take 300-600 mg by mouth 3 (three) times daily. 300mg  in the morning, 300mg  in the afternoon, 600mg  in the evening   Melatonin 3 MG Tabs Take by mouth.   mirabegron ER 50 MG Tb24 tablet Commonly known as:  MYRBETRIQ Take 1 tablet (50 mg total) by mouth daily.   Omeprazole 20 MG Tbec Take by mouth.   polyethylene glycol packet Commonly known as:  MIRALAX / GLYCOLAX   tamsulosin 0.4 MG Caps capsule Commonly known as:  FLOMAX Take 2 capsules (0.8 mg total) by mouth 2 (two) times daily.   traZODone 100 MG tablet Commonly known as:  DESYREL   venlafaxine XR 75 MG 24 hr capsule Commonly known as:  EFFEXOR-XR Take by mouth.   vitamin B-12 1000 MCG tablet Commonly known as:  CYANOCOBALAMIN Take by mouth.       Allergies:  Allergies  Allergen Reactions  . Quinapril Cough  . Ambien [Zolpidem Tartrate] Other (See Comments)    Unspecified reaction  . Fenofibrate Other (See Comments)    Unknown  . Zolpidem     Other reaction(s): Other (See Comments) Unspecified reaction  . Amoxicillin-Pot Clavulanate Nausea And Vomiting  . Morphine Nausea Only    Family History: Family History  Problem Relation Age of Onset  . Bladder Cancer Neg Hx   . Kidney cancer Neg Hx   . Prolactinoma Neg Hx   . Prostate cancer Neg Hx     Social History:  reports that he has been smoking. He has a 50.00 pack-year smoking history. He has never used smokeless tobacco. He reports that he does not drink alcohol or use drugs.  ROS: UROLOGY Frequent Urination?: Yes Hard to postpone urination?: Yes Burning/pain with urination?: No Get up at night to urinate?: Yes Leakage of urine?: Yes Urine stream starts and stops?: No Trouble  starting stream?: No Do you have to strain to urinate?: No Blood in urine?: No Urinary tract infection?: No Sexually transmitted disease?: No Injury to kidneys or bladder?: No Painful intercourse?: No Weak stream?: No Erection problems?: No Penile pain?: No  Gastrointestinal Nausea?: No Vomiting?: No Indigestion/heartburn?: No Diarrhea?: No Constipation?: No  Constitutional Fever: No Night sweats?: No Weight loss?: No Fatigue?: No  Skin Skin rash/lesions?: No Itching?: No  Eyes Blurred vision?: No Double vision?: No  Ears/Nose/Throat Sore throat?: No Sinus problems?: No  Hematologic/Lymphatic Swollen glands?: No Easy bruising?: No  Cardiovascular Leg swelling?: No Chest pain?: No  Respiratory Cough?: Yes Shortness of breath?: No  Endocrine Excessive thirst?: No  Musculoskeletal Back pain?: Yes Joint pain?: No  Neurological Headaches?: No Dizziness?: No  Psychologic Depression?: No Anxiety?: Yes  Physical Exam: BP 131/74 (BP Location: Left Arm, Patient Position: Sitting, Cuff Size: Normal)   Pulse 80   Ht 5\' 9"  (  1.753 m)   Wt 175 lb 1.6 oz (79.4 kg)   BMI 25.86 kg/m   Constitutional:  Alert and oriented, No acute distress. HEENT: [place] AT, moist mucus membranes.  Trachea midline, no masses. Cardiovascular: No clubbing, cyanosis, or edema. Respiratory: Normal respiratory effort, no increased work of breathing. GI: Abdomen is soft, nontender, nondistended, no abdominal masses GU: No CVA tenderness Lymph: No cervical or inguinal lymphadenopathy. Skin: No rashes, bruises or suspicious lesions. Neurologic: Grossly intact, no focal deficits, moving all 4 extremities. Psychiatric: Normal mood and affect.   Assessment & Plan:    1. Benign prostatic hyperplasia with urinary frequency He still has bothersome frequency and urgency though improved.  He is on maximum medical management.  Would avoid an anticholinergic due to his prostate enlargement  and do not feel he would be a candidate for Botox.  He was given literature on PTNS.  2. Elevated PSA Based on PSA stability and age would recommend discontinuing PSA screening  3.  Nephrolithiasis Nonobstructing left renal calculi.  Counseled to return for abdominal/flank pain or decreased urine output.  Follow-up 6 months.   Abbie Sons, [place] [address] [place],  [address] [phone]

## 2018-08-13 ENCOUNTER — Encounter: Payer: Self-pay | Admitting: Urology

## 2018-09-10 DIAGNOSIS — E538 Deficiency of other specified B group vitamins: Secondary | ICD-10-CM | POA: Diagnosis not present

## 2018-11-03 ENCOUNTER — Other Ambulatory Visit: Payer: Self-pay

## 2018-11-03 DIAGNOSIS — R350 Frequency of micturition: Principal | ICD-10-CM

## 2018-11-03 DIAGNOSIS — N401 Enlarged prostate with lower urinary tract symptoms: Secondary | ICD-10-CM

## 2018-11-03 MED ORDER — TAMSULOSIN HCL 0.4 MG PO CAPS: 0.8000 mg | ORAL_CAPSULE | Freq: Two times a day (BID) | ORAL | 3 refills | Status: DC

## 2018-12-08 DIAGNOSIS — L821 Other seborrheic keratosis: Secondary | ICD-10-CM | POA: Diagnosis not present

## 2018-12-08 DIAGNOSIS — R208 Other disturbances of skin sensation: Secondary | ICD-10-CM | POA: Diagnosis not present

## 2018-12-08 DIAGNOSIS — L820 Inflamed seborrheic keratosis: Secondary | ICD-10-CM | POA: Diagnosis not present

## 2018-12-08 DIAGNOSIS — Z85828 Personal history of other malignant neoplasm of skin: Secondary | ICD-10-CM | POA: Diagnosis not present

## 2018-12-08 DIAGNOSIS — Z08 Encounter for follow-up examination after completed treatment for malignant neoplasm: Secondary | ICD-10-CM | POA: Diagnosis not present

## 2018-12-24 DIAGNOSIS — R001 Bradycardia, unspecified: Secondary | ICD-10-CM | POA: Diagnosis not present

## 2018-12-24 DIAGNOSIS — F17209 Nicotine dependence, unspecified, with unspecified nicotine-induced disorders: Secondary | ICD-10-CM | POA: Diagnosis not present

## 2018-12-24 DIAGNOSIS — G300 Alzheimer's disease with early onset: Secondary | ICD-10-CM | POA: Diagnosis not present

## 2018-12-24 DIAGNOSIS — I480 Paroxysmal atrial fibrillation: Secondary | ICD-10-CM | POA: Diagnosis not present

## 2018-12-24 DIAGNOSIS — F0280 Dementia in other diseases classified elsewhere without behavioral disturbance: Secondary | ICD-10-CM | POA: Diagnosis not present

## 2018-12-24 DIAGNOSIS — J439 Emphysema, unspecified: Secondary | ICD-10-CM | POA: Diagnosis not present

## 2018-12-24 DIAGNOSIS — D329 Benign neoplasm of meninges, unspecified: Secondary | ICD-10-CM | POA: Diagnosis not present

## 2018-12-24 DIAGNOSIS — E785 Hyperlipidemia, unspecified: Secondary | ICD-10-CM | POA: Diagnosis not present

## 2018-12-24 DIAGNOSIS — R05 Cough: Secondary | ICD-10-CM | POA: Diagnosis not present

## 2018-12-24 DIAGNOSIS — I10 Essential (primary) hypertension: Secondary | ICD-10-CM | POA: Diagnosis not present

## 2018-12-31 DIAGNOSIS — G8929 Other chronic pain: Secondary | ICD-10-CM | POA: Diagnosis not present

## 2018-12-31 DIAGNOSIS — M533 Sacrococcygeal disorders, not elsewhere classified: Secondary | ICD-10-CM | POA: Diagnosis not present

## 2018-12-31 DIAGNOSIS — M47816 Spondylosis without myelopathy or radiculopathy, lumbar region: Secondary | ICD-10-CM | POA: Diagnosis not present

## 2018-12-31 DIAGNOSIS — M5442 Lumbago with sciatica, left side: Secondary | ICD-10-CM | POA: Diagnosis not present

## 2018-12-31 DIAGNOSIS — M5136 Other intervertebral disc degeneration, lumbar region: Secondary | ICD-10-CM | POA: Diagnosis not present

## 2019-01-01 ENCOUNTER — Other Ambulatory Visit: Payer: Self-pay | Admitting: Sports Medicine

## 2019-01-01 DIAGNOSIS — M47816 Spondylosis without myelopathy or radiculopathy, lumbar region: Secondary | ICD-10-CM

## 2019-01-01 DIAGNOSIS — G8929 Other chronic pain: Secondary | ICD-10-CM

## 2019-01-01 DIAGNOSIS — M5136 Other intervertebral disc degeneration, lumbar region: Secondary | ICD-10-CM

## 2019-01-05 ENCOUNTER — Emergency Department: Payer: PPO

## 2019-01-05 ENCOUNTER — Inpatient Hospital Stay
Admission: EM | Admit: 2019-01-05 | Discharge: 2019-01-08 | DRG: 872 | Disposition: A | Discharge status: Home Health | Payer: PPO | Attending: Internal Medicine | Admitting: Internal Medicine

## 2019-01-05 ENCOUNTER — Other Ambulatory Visit: Payer: Self-pay

## 2019-01-05 ENCOUNTER — Encounter: Payer: Self-pay | Admitting: Emergency Medicine

## 2019-01-05 DIAGNOSIS — F0280 Dementia in other diseases classified elsewhere without behavioral disturbance: Secondary | ICD-10-CM | POA: Diagnosis not present

## 2019-01-05 DIAGNOSIS — F17210 Nicotine dependence, cigarettes, uncomplicated: Secondary | ICD-10-CM | POA: Diagnosis not present

## 2019-01-05 DIAGNOSIS — Z79899 Other long term (current) drug therapy: Secondary | ICD-10-CM

## 2019-01-05 DIAGNOSIS — R451 Restlessness and agitation: Secondary | ICD-10-CM | POA: Diagnosis not present

## 2019-01-05 DIAGNOSIS — G4700 Insomnia, unspecified: Secondary | ICD-10-CM | POA: Diagnosis not present

## 2019-01-05 DIAGNOSIS — F411 Generalized anxiety disorder: Secondary | ICD-10-CM | POA: Diagnosis not present

## 2019-01-05 DIAGNOSIS — Z66 Do not resuscitate: Secondary | ICD-10-CM | POA: Diagnosis present

## 2019-01-05 DIAGNOSIS — Z881 Allergy status to other antibiotic agents status: Secondary | ICD-10-CM

## 2019-01-05 DIAGNOSIS — G308 Other Alzheimer's disease: Secondary | ICD-10-CM | POA: Diagnosis not present

## 2019-01-05 DIAGNOSIS — Z7982 Long term (current) use of aspirin: Secondary | ICD-10-CM

## 2019-01-05 DIAGNOSIS — Z86011 Personal history of benign neoplasm of the brain: Secondary | ICD-10-CM

## 2019-01-05 DIAGNOSIS — E871 Hypo-osmolality and hyponatremia: Secondary | ICD-10-CM | POA: Diagnosis not present

## 2019-01-05 DIAGNOSIS — Z905 Acquired absence of kidney: Secondary | ICD-10-CM

## 2019-01-05 DIAGNOSIS — N3090 Cystitis, unspecified without hematuria: Secondary | ICD-10-CM | POA: Diagnosis present

## 2019-01-05 DIAGNOSIS — Z792 Long term (current) use of antibiotics: Secondary | ICD-10-CM | POA: Diagnosis not present

## 2019-01-05 DIAGNOSIS — N390 Urinary tract infection, site not specified: Secondary | ICD-10-CM | POA: Diagnosis present

## 2019-01-05 DIAGNOSIS — A4152 Sepsis due to Pseudomonas: Principal | ICD-10-CM | POA: Diagnosis present

## 2019-01-05 DIAGNOSIS — A419 Sepsis, unspecified organism: Secondary | ICD-10-CM | POA: Diagnosis not present

## 2019-01-05 DIAGNOSIS — I10 Essential (primary) hypertension: Secondary | ICD-10-CM | POA: Diagnosis present

## 2019-01-05 DIAGNOSIS — J449 Chronic obstructive pulmonary disease, unspecified: Secondary | ICD-10-CM | POA: Diagnosis not present

## 2019-01-05 DIAGNOSIS — R531 Weakness: Secondary | ICD-10-CM | POA: Diagnosis not present

## 2019-01-05 DIAGNOSIS — Z20828 Contact with and (suspected) exposure to other viral communicable diseases: Secondary | ICD-10-CM | POA: Diagnosis not present

## 2019-01-05 DIAGNOSIS — Z87440 Personal history of urinary (tract) infections: Secondary | ICD-10-CM

## 2019-01-05 DIAGNOSIS — I480 Paroxysmal atrial fibrillation: Secondary | ICD-10-CM | POA: Diagnosis present

## 2019-01-05 DIAGNOSIS — Z79891 Long term (current) use of opiate analgesic: Secondary | ICD-10-CM

## 2019-01-05 DIAGNOSIS — Z885 Allergy status to narcotic agent status: Secondary | ICD-10-CM

## 2019-01-05 DIAGNOSIS — R509 Fever, unspecified: Secondary | ICD-10-CM | POA: Diagnosis not present

## 2019-01-05 DIAGNOSIS — F0390 Unspecified dementia without behavioral disturbance: Secondary | ICD-10-CM

## 2019-01-05 DIAGNOSIS — E785 Hyperlipidemia, unspecified: Secondary | ICD-10-CM | POA: Diagnosis present

## 2019-01-05 DIAGNOSIS — Z888 Allergy status to other drugs, medicaments and biological substances status: Secondary | ICD-10-CM

## 2019-01-05 DIAGNOSIS — G20 Parkinson's disease: Secondary | ICD-10-CM | POA: Diagnosis not present

## 2019-01-05 DIAGNOSIS — N401 Enlarged prostate with lower urinary tract symptoms: Secondary | ICD-10-CM | POA: Diagnosis present

## 2019-01-05 DIAGNOSIS — Z85528 Personal history of other malignant neoplasm of kidney: Secondary | ICD-10-CM | POA: Diagnosis not present

## 2019-01-05 DIAGNOSIS — Z7983 Long term (current) use of bisphosphonates: Secondary | ICD-10-CM | POA: Diagnosis not present

## 2019-01-05 DIAGNOSIS — R05 Cough: Secondary | ICD-10-CM | POA: Diagnosis not present

## 2019-01-05 LAB — URINALYSIS, COMPLETE (UACMP) WITH MICROSCOPIC
Bilirubin Urine: NEGATIVE
Glucose, UA: NEGATIVE mg/dL
Ketones, ur: 5 mg/dL — AB
Nitrite: NEGATIVE
Protein, ur: 100 mg/dL — AB
Specific Gravity, Urine: 1.024 (ref 1.005–1.030)
WBC, UA: >50 WBC/hpf — ABNORMAL HIGH (ref 0–5)
pH: 5 (ref 5.0–8.0)

## 2019-01-05 LAB — CBC WITH DIFFERENTIAL/PLATELET
Abs Immature Granulocytes: 0.08 10*3/uL — ABNORMAL HIGH (ref 0.00–0.07)
Basophils Absolute: 0.1 10*3/uL (ref 0.0–0.1)
Basophils Relative: 1 %
Eosinophils Absolute: 0.1 10*3/uL (ref 0.0–0.5)
Eosinophils Relative: 1 %
HCT: 41 % (ref 39.0–52.0)
Hemoglobin: 14 g/dL (ref 13.0–17.0)
Immature Granulocytes: 1 %
Lymphocytes Relative: 4 %
Lymphs Abs: 0.4 10*3/uL — ABNORMAL LOW (ref 0.7–4.0)
MCH: 32.5 pg (ref 26.0–34.0)
MCHC: 34.1 g/dL (ref 30.0–36.0)
MCV: 95.1 fL (ref 80.0–100.0)
Monocytes Absolute: 0.8 10*3/uL (ref 0.1–1.0)
Monocytes Relative: 7 %
Neutro Abs: 9.4 10*3/uL — ABNORMAL HIGH (ref 1.7–7.7)
Neutrophils Relative %: 86 %
Platelets: 149 10*3/uL — ABNORMAL LOW (ref 150–400)
RBC: 4.31 MIL/uL (ref 4.22–5.81)
RDW: 13.9 % (ref 11.5–15.5)
WBC: 10.7 10*3/uL — ABNORMAL HIGH (ref 4.0–10.5)
nRBC: 0 % (ref 0.0–0.2)

## 2019-01-05 LAB — COMPREHENSIVE METABOLIC PANEL
ALT: 5 U/L (ref 0–44)
AST: 14 U/L — ABNORMAL LOW (ref 15–41)
Albumin: 4 g/dL (ref 3.5–5.0)
Alkaline Phosphatase: 61 U/L (ref 38–126)
Anion gap: 10 (ref 5–15)
BUN: 14 mg/dL (ref 8–23)
CO2: 20 mmol/L — ABNORMAL LOW (ref 22–32)
Calcium: 8.5 mg/dL — ABNORMAL LOW (ref 8.9–10.3)
Chloride: 101 mmol/L (ref 98–111)
Creatinine, Ser: 1.13 mg/dL (ref 0.61–1.24)
GFR calc Af Amer: >60 mL/min (ref >60)
GFR calc non Af Amer: >60 mL/min (ref >60)
Glucose, Bld: 105 mg/dL — ABNORMAL HIGH (ref 70–99)
Potassium: 3.7 mmol/L (ref 3.5–5.1)
Sodium: 131 mmol/L — ABNORMAL LOW (ref 135–145)
Total Bilirubin: 1.2 mg/dL (ref 0.3–1.2)
Total Protein: 6.5 g/dL (ref 6.5–8.1)

## 2019-01-05 LAB — LACTIC ACID, PLASMA: Lactic Acid, Venous: 1.2 mmol/L (ref 0.5–1.9)

## 2019-01-05 MED ORDER — CARBIDOPA-LEVODOPA 25-100 MG PO TABS
2.0000 | ORAL_TABLET | Freq: Three times a day (TID) | ORAL | Status: DC
  Administered 2019-01-06 – 2019-01-08 (×7): 2 via ORAL
  Filled 2019-01-05 (×10): qty 2

## 2019-01-05 MED ORDER — ONDANSETRON HCL 4 MG PO TABS: 4.0000 mg | ORAL_TABLET | Freq: Four times a day (QID) | ORAL | Status: DC | PRN

## 2019-01-05 MED ORDER — DILTIAZEM HCL ER COATED BEADS 240 MG PO CP24
240.0000 mg | ORAL_CAPSULE | Freq: Every day | ORAL | Status: DC
  Administered 2019-01-06 – 2019-01-08 (×3): 240 mg via ORAL
  Filled 2019-01-05 (×3): qty 1

## 2019-01-05 MED ORDER — PANTOPRAZOLE SODIUM 40 MG PO TBEC
40.0000 mg | DELAYED_RELEASE_TABLET | Freq: Every day | ORAL | Status: DC
  Administered 2019-01-06 – 2019-01-08 (×3): 40 mg via ORAL
  Filled 2019-01-05 (×3): qty 1

## 2019-01-05 MED ORDER — DUTASTERIDE 0.5 MG PO CAPS
0.5000 mg | ORAL_CAPSULE | Freq: Every day | ORAL | Status: DC
  Administered 2019-01-06 – 2019-01-08 (×3): 0.5 mg via ORAL
  Filled 2019-01-05 (×3): qty 1

## 2019-01-05 MED ORDER — TRAZODONE HCL 50 MG PO TABS
100.0000 mg | ORAL_TABLET | Freq: Every day | ORAL | Status: DC
  Administered 2019-01-06 – 2019-01-07 (×3): 100 mg via ORAL
  Filled 2019-01-05: qty 1
  Filled 2019-01-05 (×2): qty 2

## 2019-01-05 MED ORDER — DOXAZOSIN MESYLATE 4 MG PO TABS
4.0000 mg | ORAL_TABLET | Freq: Every day | ORAL | Status: DC
  Administered 2019-01-06 – 2019-01-08 (×3): 4 mg via ORAL
  Filled 2019-01-05 (×3): qty 1

## 2019-01-05 MED ORDER — DONEPEZIL HCL 5 MG PO TABS
10.0000 mg | ORAL_TABLET | Freq: Every day | ORAL | Status: DC
  Administered 2019-01-06 – 2019-01-07 (×3): 10 mg via ORAL
  Filled 2019-01-05 (×3): qty 2

## 2019-01-05 MED ORDER — TAMSULOSIN HCL 0.4 MG PO CAPS
0.8000 mg | ORAL_CAPSULE | Freq: Two times a day (BID) | ORAL | Status: DC
  Administered 2019-01-06 (×3): 0.8 mg via ORAL
  Administered 2019-01-07: 09:00:00 0.4 mg via ORAL
  Filled 2019-01-05 (×4): qty 2

## 2019-01-05 MED ORDER — ACETAMINOPHEN 325 MG PO TABS
650.0000 mg | ORAL_TABLET | Freq: Once | ORAL | Status: AC | PRN
  Administered 2019-01-05: 650 mg via ORAL
  Filled 2019-01-05: qty 2

## 2019-01-05 MED ORDER — VENLAFAXINE HCL ER 75 MG PO CP24
75.0000 mg | ORAL_CAPSULE | Freq: Every day | ORAL | Status: DC
  Administered 2019-01-06 – 2019-01-08 (×3): 75 mg via ORAL
  Filled 2019-01-05 (×4): qty 1

## 2019-01-05 MED ORDER — SODIUM CHLORIDE 0.9 % IV SOLN
2.0000 g | Freq: Once | INTRAVENOUS | Status: AC
  Administered 2019-01-05: 2 g via INTRAVENOUS
  Filled 2019-01-05: qty 2

## 2019-01-05 MED ORDER — SODIUM CHLORIDE 0.9% FLUSH
3.0000 mL | Freq: Two times a day (BID) | INTRAVENOUS | Status: DC
  Administered 2019-01-06 – 2019-01-07 (×2): 3 mL via INTRAVENOUS

## 2019-01-05 MED ORDER — ACETAMINOPHEN 325 MG PO TABS: 650.0000 mg | ORAL_TABLET | Freq: Four times a day (QID) | ORAL | Status: DC | PRN

## 2019-01-05 MED ORDER — ASPIRIN EC 81 MG PO TBEC
81.0000 mg | DELAYED_RELEASE_TABLET | Freq: Every day | ORAL | Status: DC
  Administered 2019-01-06 – 2019-01-08 (×3): 81 mg via ORAL
  Filled 2019-01-05 (×3): qty 1

## 2019-01-05 MED ORDER — SODIUM CHLORIDE 0.9 % IV SOLN: 1.0000 g | Freq: Once | INTRAVENOUS | Status: DC

## 2019-01-05 MED ORDER — ONDANSETRON HCL 4 MG/2ML IJ SOLN: 4.0000 mg | Freq: Four times a day (QID) | INTRAMUSCULAR | Status: DC | PRN

## 2019-01-05 MED ORDER — ENOXAPARIN SODIUM 40 MG/0.4ML ~~LOC~~ SOLN
40.0000 mg | SUBCUTANEOUS | Status: DC
  Administered 2019-01-06 – 2019-01-08 (×3): 40 mg via SUBCUTANEOUS
  Filled 2019-01-05 (×3): qty 0.4

## 2019-01-05 MED ORDER — MIRABEGRON ER 50 MG PO TB24
50.0000 mg | ORAL_TABLET | Freq: Every day | ORAL | Status: DC
  Administered 2019-01-06 – 2019-01-08 (×3): 50 mg via ORAL
  Filled 2019-01-05 (×3): qty 1

## 2019-01-05 MED ORDER — SODIUM CHLORIDE 0.9 % IV SOLN
INTRAVENOUS | Status: DC
  Administered 2019-01-06 – 2019-01-08 (×7): via INTRAVENOUS

## 2019-01-05 MED ORDER — ACETAMINOPHEN 650 MG RE SUPP: 650.0000 mg | Freq: Four times a day (QID) | RECTAL | Status: DC | PRN

## 2019-01-05 MED ORDER — POLYETHYLENE GLYCOL 3350 17 G PO PACK
17.0000 g | PACK | Freq: Every day | ORAL | Status: DC | PRN
  Administered 2019-01-07 – 2019-01-08 (×2): 17 g via ORAL
  Filled 2019-01-05 (×2): qty 1

## 2019-01-05 MED ORDER — CYANOCOBALAMIN 500 MCG PO TABS
500.0000 ug | ORAL_TABLET | Freq: Every day | ORAL | Status: DC
  Administered 2019-01-06 – 2019-01-08 (×3): 500 ug via ORAL
  Filled 2019-01-05 (×4): qty 1

## 2019-01-05 MED ORDER — MELATONIN 5 MG PO TABS
2.5000 mg | ORAL_TABLET | Freq: Every evening | ORAL | Status: DC | PRN
  Filled 2019-01-05: qty 0.5

## 2019-01-05 MED ORDER — GABAPENTIN 300 MG PO CAPS
300.0000 mg | ORAL_CAPSULE | Freq: Three times a day (TID) | ORAL | Status: DC
  Administered 2019-01-06 (×3): 300 mg via ORAL
  Filled 2019-01-05 (×3): qty 1

## 2019-01-05 NOTE — ED Notes (Signed)
Patient provided with some OJ. Repositioned

## 2019-01-05 NOTE — ED Provider Notes (Signed)
[facility] Emergency Department Provider Note  ____________________________________________  Time seen: Approximately 9:43 PM  I have reviewed the triage vital signs and the nursing notes.   HISTORY  Chief Complaint Fever and Cough    Level 5 Caveat: Portions of the History and Physical including HPI and review of systems are unable to be completely obtained due to patient being a poor historian   HPI Russell Terry is a 78 y.o. male with a history of atrial fibrillation, BPH, dementia, Parkinson's, hypertension who is brought to the ED today due to nonproductive cough this morning and a fever of 102.  No sick contacts.  Also reports some nausea and decreased oral intake, body aches, chills.  Wife reports that he has had urinary tract infections in the past with similar symptoms.  No shortness of breath or focal pain complaints.      Past Medical History:  Diagnosis Date  . Atrial fibrillation ([place]) 12/11/2013  . BPH (benign prostatic hyperplasia)   . Bradycardia 12/11/2013  . Calculus of kidney 12/06/2013  . Cancer ([place])    rt kidney removed  . Chronic kidney disease    stones,rt kidney removed  . COPD (chronic obstructive pulmonary disease) ([place]) 12/11/2013  . DDD (degenerative disc disease), lumbar 12/13/2013  . Delirium   . Dementia ([place])   . Elevated prostate specific antigen (PSA) 03/03/2013  . Encephalopathy acute 07/22/2016  . Enlarged prostate with lower urinary tract symptoms (LUTS) 03/03/2013  . Essential hypertension 07/22/2016  . Hyperlipidemia, unspecified 07/09/2017  . Hypertension   . Meningioma ([place]) 10/08/2016  . Parkinsonian features 10/08/2016  . Pleuritic chest pain 05/31/2014  . Renal cell carcinoma ([place]) 05/04/2016  . Renal cyst, acquired, left 04/22/2015     Patient Active Problem List   Diagnosis Date Noted  . Personal history of kidney cancer 01/08/2018  . Hyperlipidemia, unspecified 07/09/2017  . Anxiety, generalized  10/08/2016  . Meningioma ([place]) 10/08/2016  . Insomnia, persistent 10/08/2016  . Parkinsonian features 10/08/2016  . Agitation 07/22/2016  . Encephalopathy acute 07/22/2016  . Essential hypertension 07/22/2016  . Pseudomonas urinary tract infection 07/22/2016  . Alzheimer's dementia without behavioral disturbance ([place]) 04/18/2016  . Neck pain 11/15/2015  . Lumbar stenosis with neurogenic claudication 10/18/2015  . Renal cyst, acquired, left 04/22/2015  . Pleuritic chest pain 05/31/2014  . History of rectal bleeding 01/11/2014  . History of kidney removal 12/22/2013  . DDD (degenerative disc disease), lumbar 12/13/2013  . Atrial fibrillation ([place]) 12/11/2013  . Bradycardia 12/11/2013  . COPD (chronic obstructive pulmonary disease) ([place]) 12/11/2013  . Calculus of kidney 12/06/2013  . Other specified disorders of kidney and ureter 11/17/2013  . Enlarged prostate with lower urinary tract symptoms (LUTS) 03/03/2013  . Elevated prostate specific antigen (PSA) 03/03/2013  . Incomplete emptying of bladder 03/03/2013  . Nocturia 03/03/2013     Past Surgical History:  Procedure Laterality Date  . BRAIN SURGERY  2017  . COLON SURGERY    . COLONOSCOPY WITH PROPOFOL N/A 12/31/2016   Procedure: COLONOSCOPY WITH PROPOFOL;  Surgeon: Lollie Sails, MD;  Location: [facility] ENDOSCOPY;  Service: Endoscopy;  Laterality: N/A;  . [place]    . LUMBAR LAMINECTOMY/DECOMPRESSION MICRODISCECTOMY N/A 10/18/2015   Procedure: LUMBAR LAMINECTOMY/DECOMPRESSION MICRODISCECTOMY 3 LEVELS;  Surgeon: Newman Pies, MD;  Location: [facility] NEURO ORS;  Service: Neurosurgery;  Laterality: N/A;  L23 L34 L45 laminectomy and foraminotomy  . NEPHRECTOMY     2012  . URETEROSCOPY WITH HOLMIUM LASER LITHOTRIPSY  Prior to Admission medications   Medication Sig Start Date End Date Taking? Authorizing Provider  aspirin EC 81 MG tablet Take 81 mg by mouth daily.    [provider]  azithromycin  (ZITHROMAX) 500 MG tablet Take 500 mg by mouth daily. 12/26/17   [provider]  carbidopa-levodopa (SINEMET IR) 25-100 MG tablet Take 2 tablets by mouth 3 (three) times daily.  05/03/17   [provider]  diltiazem (CARDIZEM CD) 240 MG 24 hr capsule TAKE 1 CAPSULE (240 MG TOTAL) BY MOUTH EVERY MORNING. 11/12/17   [provider]  diphenhydrAMINE HCl (BENADRYL ALLERGY PO) Take by mouth.    [provider]  donepezil (ARICEPT) 10 MG tablet TAKE 1 TABLET BY MOUTH EVERY DAY AT NIGHT 06/27/17   [provider]  doxazosin (CARDURA) 4 MG tablet Take 4 mg by mouth daily.    [provider]  dutasteride (AVODART) 0.5 MG capsule Take 0.5 mg by mouth daily.    [provider]  gabapentin (NEURONTIN) 300 MG capsule Take 300-600 mg by mouth 3 (three) times daily. 300mg  in the morning, 300mg  in the afternoon, 600mg  in the evening    [provider]  Melatonin 3 MG TABS Take by mouth. 07/19/16   [provider]  mirabegron ER (MYRBETRIQ) 50 MG TB24 tablet Take 1 tablet (50 mg total) by mouth daily. 02/01/18   Stoioff, Ronda Fairly, MD  Omeprazole 20 MG TBEC Take by mouth. 03/13/14   [provider]  polyethylene glycol (MIRALAX / GLYCOLAX) packet  12/12/13   [provider]  tamsulosin (FLOMAX) 0.4 MG CAPS capsule Take 2 capsules (0.8 mg total) by mouth 2 (two) times daily. 11/03/18   Abbie Sons, MD  traZODone (DESYREL) 100 MG tablet  07/04/18   [provider]  venlafaxine XR (EFFEXOR-XR) 75 MG 24 hr capsule Take by mouth. 04/14/18 04/14/19  [provider]  vitamin B-12 (CYANOCOBALAMIN) 1000 MCG tablet Take by mouth.    [provider]     Allergies Quinapril, Ambien [zolpidem tartrate], Fenofibrate, Zolpidem, Amoxicillin-pot clavulanate, and Morphine   Family History  Problem Relation Age of Onset  . Bladder Cancer Neg Hx   . Kidney cancer Neg Hx   . Prolactinoma Neg Hx   . Prostate  cancer Neg Hx     Social History Social History   Tobacco Use  . Smoking status: Current Every Day Smoker    Packs/day: 1.00    Years: 50.00    Pack years: 50.00  . Smokeless tobacco: Never Used  Substance Use Topics  . Alcohol use: No  . Drug use: No    Review of Systems Level 5 Caveat: Portions of the History and Physical including HPI and review of systems are unable to be completely obtained due to patient being a poor historian   Constitutional:   Positive fever.  ENT:   No rhinorrhea. Cardiovascular:   No chest pain or syncope. Respiratory:   No dyspnea or cough. Gastrointestinal:   Negative for abdominal pain, vomiting and diarrhea.  Musculoskeletal:   Negative for focal pain or swelling ____________________________________________   PHYSICAL EXAM:  VITAL SIGNS: ED Triage Vitals  Enc Vitals Group     BP 01/05/19 1528 116/68     Pulse Rate 01/05/19 1528 85     Resp 01/05/19 1900 18     Temp 01/05/19 1528 (!) 102 F (38.9 C)     Temp Source 01/05/19 1528 Oral     SpO2  01/05/19 1528 96 %     Weight 01/05/19 1528 168 lb (76.2 kg)     Height 01/05/19 1528 5\' 9"  (1.753 m)     Head Circumference --      Peak Flow --      Pain Score 01/05/19 1535 0     Pain Loc --      Pain Edu? --      Excl. in [place]? --     Vital signs reviewed, nursing assessments reviewed.   Constitutional:   Alert and oriented. Non-toxic appearance. Eyes:   Conjunctivae are normal. EOMI. PERRL. ENT      Head:   Normocephalic and atraumatic.      Nose:   No congestion/rhinnorhea.       Mouth/Throat:   Dry mucous membranes, no pharyngeal erythema. No peritonsillar mass.       Neck:   No meningismus. Full ROM. Hematological/Lymphatic/Immunilogical:   No cervical lymphadenopathy. Cardiovascular:   RRR. Symmetric bilateral radial and DP pulses.  No murmurs. Cap refill less than 2 seconds. Respiratory:   Normal respiratory effort without tachypnea/retractions. Breath sounds are clear and  equal bilaterally. No wheezes/rales/rhonchi. Gastrointestinal:   Soft and nontender. Non distended. There is no CVA tenderness.  No rebound, rigidity, or guarding. Musculoskeletal:   Normal range of motion in all extremities. No joint effusions.  No lower extremity tenderness.  No edema. Neurologic:   Normal speech, limited language range. Tremor Motor grossly intact. No acute focal neurologic deficits are appreciated.  Skin:    Skin is warm, dry and intact. No rash noted.  No petechiae, purpura, or bullae.  ____________________________________________    LABS (pertinent positives/negatives) (all labs ordered are listed, but only abnormal results are displayed) Labs Reviewed  COMPREHENSIVE METABOLIC PANEL - Abnormal; Notable for the following components:      Result Value   Sodium 131 (*)    CO2 20 (*)    Glucose, Bld 105 (*)    Calcium 8.5 (*)    AST 14 (*)    All other components within normal limits  CBC WITH DIFFERENTIAL/PLATELET - Abnormal; Notable for the following components:   WBC 10.7 (*)    Platelets 149 (*)    Neutro Abs 9.4 (*)    Lymphs Abs 0.4 (*)    Abs Immature Granulocytes 0.08 (*)    All other components within normal limits  URINALYSIS, COMPLETE (UACMP) WITH MICROSCOPIC - Abnormal; Notable for the following components:   Color, Urine AMBER (*)    APPearance HAZY (*)    Hgb urine dipstick SMALL (*)    Ketones, ur 5 (*)    Protein, ur 100 (*)    Leukocytes,Ua LARGE (*)    WBC, UA >50 (*)    Bacteria, UA RARE (*)    All other components within normal limits  SARS CORONAVIRUS 2 (HOSPITAL ORDER, [place] [facility])  URINE CULTURE  LACTIC ACID, PLASMA   ____________________________________________   EKG    ____________________________________________    RADIOLOGY  Dg Chest Portable 1 View  Result Date: 01/05/2019 CLINICAL DATA:  Cough and fever EXAM: PORTABLE CHEST 1 VIEW COMPARISON:  August 08, 2016 FINDINGS: The heart size  and mediastinal contours are within normal limits. Both lungs are clear. The visualized skeletal structures are unremarkable. There is scattered areas of pleuroparenchymal scarring bilaterally. There may be mild emphysematous changes bilaterally. IMPRESSION: No active disease. Electronically Signed   By: Constance Holster M.D.   On: 01/05/2019 19:32  ____________________________________________   PROCEDURES Procedures  ____________________________________________  DIFFERENTIAL DIAGNOSIS   Pneumonia, UTI, coronavirus  CLINICAL IMPRESSION / ASSESSMENT AND PLAN / ED COURSE  Pertinent labs & imaging results that were available during my care of the patient were reviewed by me and considered in my medical decision making (see chart for details).   Russell Terry was evaluated in Emergency Department on 01/05/2019 for the symptoms described in the history of present illness. He was evaluated in the context of the global COVID-19 pandemic, which necessitated consideration that the patient might be at risk for infection with the SARS-CoV-2 virus that causes COVID-19. Institutional protocols and algorithms that pertain to the evaluation of patients at risk for COVID-19 are in a state of rapid change based on information released by regulatory bodies including the CDC and federal and state organizations. These policies and algorithms were followed during the patient's care in the ED.   Patient presents with fever of 102.  Exam is nonfocal.Considering the patient's symptoms, medical history, and physical examination today, I have low suspicion for cholecystitis or biliary pathology, pancreatitis, perforation or bowel obstruction, hernia, intra-abdominal abscess, AAA or dissection, volvulus or intussusception, mesenteric ischemia, or appendicitis.  Chest x-ray unremarkable, labs are unremarkable, urinalysis consistent with UTI.  Review of EMR shows that on previous urine cultures he has had  Pseudomonas UTI which is resistant to fluoroquinolones.  Unfortunately there are no other acceptable oral antibiotic choices of the patient will need to be hospitalized for management.  I will start cefepime.  Discussed with the hospitalist for further management.  Urine culture sent.  Patient is not septic.      ____________________________________________   FINAL CLINICAL IMPRESSION(S) / ED DIAGNOSES    Final diagnoses:  Cystitis  Fever, unspecified fever cause  Dementia without behavioral disturbance, unspecified dementia type [facility])     ED Discharge Orders    None      Portions of this note were generated with dragon dictation software. Dictation errors may occur despite best attempts at proofreading.   Carrie Mew, MD 01/05/19 2146

## 2019-01-05 NOTE — ED Triage Notes (Signed)
Patient reports having cough earlier this morning. States he took his temperature and had a fever. Patient denies contact with anyone who has been sick. Reports mild nausea and body aches.

## 2019-01-05 NOTE — ED Notes (Signed)
Wife at bedside, states reason for accompanying patient as "patient has dementia and is a fall risk"

## 2019-01-05 NOTE — ED Notes (Signed)
X-ray at bedside

## 2019-01-05 NOTE — H&P (Addendum)
[facility] NAME: Russell Terry    MR#:  [id number]  DATE OF BIRTH:  [birth year]  DATE OF ADMISSION:  01/05/2019  PRIMARY CARE PHYSICIAN: Maryland Pink, MD   REQUESTING/REFERRING PHYSICIAN: Brenton Grills, MD  CHIEF COMPLAINT:   Chief Complaint  Patient presents with  . Fever  . Cough    HISTORY OF PRESENT ILLNESS:  Antoinne Spadaccini  is a 78 y.o. male with a known history of atrial fibrillation, BPH, dementia, Parkinson's disease, hypertension.  Patient was brought to the emergency room today by his wife noting fever of 102 earlier in the day with increased fatigue and malaise over the last 2 days.  She has noted decreased oral intake with complaints of nausea as well as body aches and chills.  He has a history of urinary tract infections with similar symptoms associated.  Patient denies shortness of breath or cough.  He denies chest pain.  He has experienced no episodes of vomiting.  Urinalysis demonstrates large amount leukocytes and WBCs as well as bacteria.  WBC is 10.7 with lactic acid 1.2.  Chest x-ray shows no active pulmonary disease.  Patient was started on IV antibiotic therapy in the emergency room.  We have admitted him to the hospital service for further management.  PAST MEDICAL HISTORY:   Past Medical History:  Diagnosis Date  . Atrial fibrillation ([address]) 12/11/2013  . BPH (benign prostatic hyperplasia)   . Bradycardia 12/11/2013  . Calculus of kidney 12/06/2013  . Cancer ([place])    rt kidney removed  . Chronic kidney disease    stones,rt kidney removed  . COPD (chronic obstructive pulmonary disease) ([place]) 12/11/2013  . DDD (degenerative disc disease), lumbar 12/13/2013  . Delirium   . Dementia ([place])   . Elevated prostate specific antigen (PSA) 03/03/2013  . Encephalopathy acute 07/22/2016  . Enlarged prostate with lower urinary tract symptoms (LUTS) 03/03/2013  . Essential hypertension 07/22/2016  .  Hyperlipidemia, unspecified 07/09/2017  . Hypertension   . Meningioma ([place]) 10/08/2016  . Parkinsonian features 10/08/2016  . Pleuritic chest pain 05/31/2014  . Renal cell carcinoma ([place]) 05/04/2016  . Renal cyst, acquired, left 04/22/2015    PAST SURGICAL HISTORY:   Past Surgical History:  Procedure Laterality Date  . BRAIN SURGERY  2017  . COLON SURGERY    . COLONOSCOPY WITH PROPOFOL N/A 12/31/2016   Procedure: COLONOSCOPY WITH PROPOFOL;  Surgeon: Lollie Sails, MD;  Location: [facility] ENDOSCOPY;  Service: Endoscopy;  Laterality: N/A;  . [place]    . LUMBAR LAMINECTOMY/DECOMPRESSION MICRODISCECTOMY N/A 10/18/2015   Procedure: LUMBAR LAMINECTOMY/DECOMPRESSION MICRODISCECTOMY 3 LEVELS;  Surgeon: Newman Pies, MD;  Location: [place] NEURO ORS;  Service: Neurosurgery;  Laterality: N/A;  L23 L34 L45 laminectomy and foraminotomy  . NEPHRECTOMY     2012  . URETEROSCOPY WITH HOLMIUM LASER LITHOTRIPSY      SOCIAL HISTORY:   Social History   Tobacco Use  . Smoking status: Current Every Day Smoker    Packs/day: 1.00    Years: 50.00    Pack years: 50.00  . Smokeless tobacco: Never Used  Substance Use Topics  . Alcohol use: No    FAMILY HISTORY:   Family History  Problem Relation Age of Onset  . Bladder Cancer Neg Hx   . Kidney cancer Neg Hx   . Prolactinoma Neg Hx   . Prostate cancer Neg Hx     DRUG ALLERGIES:   Allergies  Allergen Reactions  .  Quinapril Cough  . Ambien [Zolpidem Tartrate] Other (See Comments)    Unspecified reaction  . Fenofibrate Other (See Comments)    Unknown  . Zolpidem     Other reaction(s): Other (See Comments) Unspecified reaction  . Amoxicillin-Pot Clavulanate Nausea And Vomiting  . Morphine Nausea Only    REVIEW OF SYSTEMS:   Review of Systems  Constitutional: Positive for chills, diaphoresis, fever and malaise/fatigue.  HENT: Negative for congestion, sinus pain and sore throat.   Eyes: Negative for blurred vision,  double vision and pain.  Respiratory: Negative for cough, shortness of breath and wheezing.   Cardiovascular: Negative for chest pain, palpitations and leg swelling.  Gastrointestinal: Positive for nausea. Negative for abdominal pain, constipation, diarrhea, heartburn and vomiting.  Genitourinary: Positive for dysuria, frequency and urgency. Negative for flank pain and hematuria.  Musculoskeletal: Positive for myalgias. Negative for back pain, falls and joint pain.  Skin: Negative.  Negative for itching and rash.  Neurological: Positive for tremors (Parkinson) and weakness (Generalized). Negative for dizziness, seizures, loss of consciousness and headaches.  Psychiatric/Behavioral: Negative.  Negative for depression.  All other systems reviewed and are negative.   MEDICATIONS AT HOME:   Prior to Admission medications   Medication Sig Start Date End Date Taking? Authorizing Provider  aspirin EC 81 MG tablet Take 81 mg by mouth daily.   Yes [provider]  carbidopa-levodopa (SINEMET IR) 25-100 MG tablet Take 2 tablets by mouth 3 (three) times daily.  05/03/17  Yes [provider]  diltiazem (CARDIZEM CD) 240 MG 24 hr capsule TAKE 1 CAPSULE (240 MG TOTAL) BY MOUTH EVERY MORNING. 11/12/17  Yes [provider]  docusate sodium (COLACE) 100 MG capsule Take 100 mg by mouth at bedtime.   Yes [provider]  donepezil (ARICEPT) 10 MG tablet TAKE 1 TABLET BY MOUTH EVERY DAY AT NIGHT 06/27/17  Yes [provider]  doxazosin (CARDURA) 4 MG tablet Take 4 mg by mouth daily.   Yes [provider]  dutasteride (AVODART) 0.5 MG capsule Take 0.5 mg by mouth daily.   Yes [provider]  mirabegron ER (MYRBETRIQ) 50 MG TB24 tablet Take 1 tablet (50 mg total) by mouth daily. 02/01/18  Yes Stoioff, Ronda Fairly, MD  polyethylene glycol (MIRALAX / GLYCOLAX) packet Take 17 g by mouth daily.  12/12/13  Yes [provider]  tamsulosin (FLOMAX) 0.4 MG  CAPS capsule Take 2 capsules (0.8 mg total) by mouth 2 (two) times daily. Patient taking differently: Take 0.4 mg by mouth 2 (two) times daily.  11/03/18  Yes Stoioff, Ronda Fairly, MD  traZODone (DESYREL) 100 MG tablet Take 200 mg by mouth at bedtime.  07/04/18  Yes [provider]  venlafaxine XR (EFFEXOR-XR) 75 MG 24 hr capsule Take 75 mg by mouth at bedtime.  04/14/18 04/14/19 Yes [provider]  vitamin B-12 (CYANOCOBALAMIN) 1000 MCG tablet Take 1,000 mcg by mouth at bedtime.    Yes [provider]  azithromycin (ZITHROMAX) 500 MG tablet Take 500 mg by mouth daily. 12/26/17   [provider]  diphenhydrAMINE HCl (BENADRYL ALLERGY PO) Take by mouth.    [provider]  Omeprazole 20 MG TBEC Take by mouth. 03/13/14   [provider]      VITAL SIGNS:  Blood pressure (!) 142/89, pulse 73, temperature 99.8 F (37.7 C), temperature source Oral, resp. rate (!) 28, height 5\' 9"  (1.753 m), weight 76.2 kg, SpO2 94 %.  PHYSICAL EXAMINATION:  Physical Exam  Constitutional:      General: He is not in acute distress.    Appearance: He is normal weight. He is not ill-appearing.  HENT:     Head: Normocephalic.     Right Ear: External ear normal.     Left Ear: External ear normal.     Nose: Nose normal.     Mouth/Throat:     Mouth: Mucous membranes are moist.     Pharynx: Oropharynx is clear.  Eyes:     General: No scleral icterus.    Extraocular Movements: Extraocular movements intact.     Conjunctiva/sclera: Conjunctivae normal.     Pupils: Pupils are equal, round, and reactive to light.  Neck:     Musculoskeletal: Normal range of motion and neck supple.  Cardiovascular:     Rate and Rhythm: Normal rate and regular rhythm.     Pulses: Normal pulses.     Heart sounds: Normal heart sounds. No murmur. No friction rub. No gallop.   Pulmonary:     Effort: Pulmonary effort is normal. No respiratory distress.     Breath sounds: Normal breath  sounds. No wheezing, rhonchi or rales.  Abdominal:     General: Abdomen is flat. There is no distension.     Palpations: Abdomen is soft.     Tenderness: There is abdominal tenderness (Lower abdominal tenderness). There is no right CVA tenderness, left CVA tenderness, guarding or rebound.  Musculoskeletal: Normal range of motion.        General: No tenderness.     Right lower leg: No edema.     Left lower leg: No edema.  Skin:    General: Skin is warm and dry.     Capillary Refill: Capillary refill takes less than 2 seconds.     Coloration: Skin is not jaundiced.     Findings: No bruising or rash.  Neurological:     Mental Status: He is alert. Mental status is at baseline.  Psychiatric:        Mood and Affect: Mood normal.     LABORATORY PANEL:   CBC Recent Labs  Lab 01/05/19 1558  WBC 10.7*  HGB 14.0  HCT 41.0  PLT 149*   ------------------------------------------------------------------------------------------------------------------  Chemistries  Recent Labs  Lab 01/05/19 1558  NA 131*  K 3.7  CL 101  CO2 20*  GLUCOSE 105*  BUN 14  CREATININE 1.13  CALCIUM 8.5*  AST 14*  ALT 5  ALKPHOS 61  BILITOT 1.2   ------------------------------------------------------------------------------------------------------------------  Cardiac Enzymes No results for input(s): TROPONINI in the last 168 hours. ------------------------------------------------------------------------------------------------------------------  RADIOLOGY:  Dg Chest Portable 1 View  Result Date: 01/05/2019 CLINICAL DATA:  Cough and fever EXAM: PORTABLE CHEST 1 VIEW COMPARISON:  August 08, 2016 FINDINGS: The heart size and mediastinal contours are within normal limits. Both lungs are clear. The visualized skeletal structures are unremarkable. There is scattered areas of pleuroparenchymal scarring bilaterally. There may be mild emphysematous changes bilaterally. IMPRESSION: No active disease.  Electronically Signed   By: Constance Holster M.D.   On: 01/05/2019 19:32      IMPRESSION AND PLAN:   1.  Urinary tract infection - With history of Pseudomonas.  Will treat with IV Cefepime- We will await results of urine culture and adjust therapy as indicated - Normal saline infusing to peripheral IV at 75 cc/h  2.  Generalized weakness - In the setting of Parkinson's disease -We will consult physical therapy for supportive care  3.  Hypertension -Diltiazem and  Cardura continued -We will treat persistent hypertension expectantly  4.  History of atrial fibrillation - Diltiazem continue - Telemetry monitoring  5.  Dementia - Aricept continued  DVT and PPI prophylaxis initiated    All the records are reviewed and case discussed with ED provider. The plan of care was discussed in details with the patient (and family). I answered all questions. The patient agreed to proceed with the above mentioned plan. Further management will depend upon hospital course.   CODE STATUS: Full code  TOTAL TIME TAKING CARE OF THIS PATIENT: 45 minutes.    [place] on 01/05/2019 at 11:05 PM  Pager - [phone]  After 6pm go to [email or web address] - Proofreader  [employer] [facility]  Office  [phone]  CC: Primary care physician; [place] Pink, MD   Note: This dictation was prepared with Dragon dictation along with smaller phrase technology. Any transcriptional errors that result from this process are unintentional.

## 2019-01-06 LAB — CBC
HCT: 40 % (ref 39.0–52.0)
Hemoglobin: 13.7 g/dL (ref 13.0–17.0)
MCH: 32.7 pg (ref 26.0–34.0)
MCHC: 34.3 g/dL (ref 30.0–36.0)
MCV: 95.5 fL (ref 80.0–100.0)
Platelets: 144 10*3/uL — ABNORMAL LOW (ref 150–400)
RBC: 4.19 MIL/uL — ABNORMAL LOW (ref 4.22–5.81)
RDW: 14.1 % (ref 11.5–15.5)
WBC: 10.3 10*3/uL (ref 4.0–10.5)
nRBC: 0 % (ref 0.0–0.2)

## 2019-01-06 LAB — BASIC METABOLIC PANEL
Anion gap: 7 (ref 5–15)
BUN: 17 mg/dL (ref 8–23)
CO2: 23 mmol/L (ref 22–32)
Calcium: 8.2 mg/dL — ABNORMAL LOW (ref 8.9–10.3)
Chloride: 103 mmol/L (ref 98–111)
Creatinine, Ser: 1.23 mg/dL (ref 0.61–1.24)
GFR calc Af Amer: >60 mL/min (ref >60)
GFR calc non Af Amer: 56 mL/min — ABNORMAL LOW (ref >60)
Glucose, Bld: 94 mg/dL (ref 70–99)
Potassium: 3.7 mmol/L (ref 3.5–5.1)
Sodium: 133 mmol/L — ABNORMAL LOW (ref 135–145)

## 2019-01-06 LAB — SARS CORONAVIRUS 2 BY RT PCR (HOSPITAL ORDER, PERFORMED IN ~~LOC~~ HOSPITAL LAB): SARS Coronavirus 2: NEGATIVE

## 2019-01-06 MED ORDER — SODIUM CHLORIDE 0.9 % IV SOLN
2.0000 g | Freq: Two times a day (BID) | INTRAVENOUS | Status: DC
  Administered 2019-01-06 – 2019-01-08 (×5): 2 g via INTRAVENOUS
  Filled 2019-01-06 (×8): qty 2

## 2019-01-06 NOTE — TOC Initial Note (Signed)
Transition of Care [facility]) - Initial/Assessment Note    Patient Details  Name: Russell Terry MRN: [id number] Date of Birth: 1941/01/31  Transition of Care [facility]) CM/SW Contact:    Shelbie Hutching, RN Phone Number: 01/06/2019, 4:38 PM  Clinical Narrative:                 Patient admitted with UTI.  Patient is from home with his wife.  Patient has a history of dementia and parkinson's.  Wife is on her way up to the hospital to be with him because of his dementia.  Wife and patient are agreeable to home health services, wife reports she could use some help at home.  Wife reports that they have all needed DME.  RNCM will offer home health choice tomorrow, wife reports she will be here.   Expected Discharge Plan: [place] Barriers to Discharge: Continued Medical Work up   Patient Goals and CMS Choice Patient states their goals for this hospitalization and ongoing recovery are:: Patient states he wants to know why he keeps getting urinary tract infections. CMS Medicare.gov Compare Post Acute Care list provided to:: Patient Represenative (must comment)(wife) Choice offered to / list presented to : Spouse  Expected Discharge Plan and Services Expected Discharge Plan: [place]   Discharge Planning Services: CM Consult Post Acute Care Choice: [place] arrangements for the past 2 months: Single Family Home                                      Prior Living Arrangements/Services Living arrangements for the past 2 months: Single Family Home Lives with:: Spouse Patient language and need for interpreter reviewed:: No Do you feel safe going back to the place where you live?: Yes      Need for Family Participation in Patient Care: Yes (Comment)(Dementia, and parkinson's) Care giver support system in place?: Yes (comment)(wife)   Criminal Activity/Legal Involvement Pertinent to Current Situation/Hospitalization: No - Comment as  needed  Activities of Daily Living Home Assistive Devices/Equipment: None ADL Screening (condition at time of admission) Patient's cognitive ability adequate to safely complete daily activities?: No Is the patient deaf or have difficulty hearing?: No Does the patient have difficulty seeing, even when wearing glasses/contacts?: No Does the patient have difficulty concentrating, remembering, or making decisions?: Yes Patient able to express need for assistance with ADLs?: No Does the patient have difficulty dressing or bathing?: Yes Independently performs ADLs?: No Communication: Appropriate for developmental age Dressing (OT): Needs assistance Is this a change from baseline?: Pre-admission baseline Grooming: Needs assistance Is this a change from baseline?: Pre-admission baseline Feeding: Needs assistance Is this a change from baseline?: Pre-admission baseline Bathing: Needs assistance Is this a change from baseline?: Pre-admission baseline Toileting: Needs assistance Is this a change from baseline?: Pre-admission baseline In/Out Bed: Independent Walks in Home: Independent Does the patient have difficulty walking or climbing stairs?: Yes Weakness of Legs: Both Weakness of Arms/Hands: None  Permission Sought/Granted Permission sought to share information with : Family Supports Permission granted to share information with : Yes, Verbal Permission Granted        Permission granted to share info w Relationship: Wife     Emotional Assessment Appearance:: Appears stated age Attitude/Demeanor/Rapport: Engaged Affect (typically observed): Accepting Orientation: : Oriented to Self, Oriented to Place, Oriented to  Time, Oriented to Situation Alcohol / Substance  Use: Not Applicable Psych Involvement: No (comment)  Admission diagnosis:  Cystitis [N30.90] Fever, unspecified fever cause [R50.9] Dementia without behavioral disturbance, unspecified dementia type ([place]) [F03.90] Patient  Active Problem List   Diagnosis Date Noted  . UTI (urinary tract infection) 01/05/2019  . Personal history of kidney cancer 01/08/2018  . Hyperlipidemia, unspecified 07/09/2017  . Anxiety, generalized 10/08/2016  . Meningioma ([place]) 10/08/2016  . Insomnia, persistent 10/08/2016  . Parkinsonian features 10/08/2016  . Agitation 07/22/2016  . Encephalopathy acute 07/22/2016  . Essential hypertension 07/22/2016  . Pseudomonas urinary tract infection 07/22/2016  . Alzheimer's dementia without behavioral disturbance ([place]) 04/18/2016  . Neck pain 11/15/2015  . Lumbar stenosis with neurogenic claudication 10/18/2015  . Renal cyst, acquired, left 04/22/2015  . Pleuritic chest pain 05/31/2014  . History of rectal bleeding 01/11/2014  . History of kidney removal 12/22/2013  . DDD (degenerative disc disease), lumbar 12/13/2013  . Atrial fibrillation ([place]) 12/11/2013  . Bradycardia 12/11/2013  . COPD (chronic obstructive pulmonary disease) ([place]) 12/11/2013  . Calculus of kidney 12/06/2013  . Other specified disorders of kidney and ureter 11/17/2013  . Enlarged prostate with lower urinary tract symptoms (LUTS) 03/03/2013  . Elevated prostate specific antigen (PSA) 03/03/2013  . Incomplete emptying of bladder 03/03/2013  . Nocturia 03/03/2013   PCP:  [place] Pink, MD Pharmacy:   [id number] - [place], [address] [place] [address] Phone: [phone] Fax: [phone]     Social Determinants of Health (SDOH) Interventions    Readmission Risk Interventions No flowsheet data found.

## 2019-01-06 NOTE — Progress Notes (Signed)
Family Meeting Note  Advance Directive:yes  Today a meeting took place with the Patient.spouse   The following clinical team members were present during this meeting:MD  The following were discussed:Patient's diagnosis: Sepsis with UTI with underlying dementia and Parkinson's disease, Patient's progosis: Unable to determine and Goals for treatment: DNR  Additional follow-up to be provided: Outpatient palliative care services and home with home health  Time spent during discussion: 68 minutes Bettey Costa, MD

## 2019-01-06 NOTE — Progress Notes (Signed)
Physical Therapy Evaluation Patient Details Name: Russell Terry MRN: [id number] DOB: 03-30-1941 Today's Date: 01/06/2019   History of Present Illness  Khaleb Broz  is a 78 y.o. male with a known history of atrial fibrillation, BPH, dementia, Parkinson's disease, hypertension.  Patient was brought to the emergency room today by his wife noting fever of 102 earlier in the day with increased fatigue and malaise over the last 2 days.  Clinical Impression  Patient is not feeling very well due to fever and weakness. He has -3/5 BLE hip strength, 3/5 BLE knee and ankle strength. He performs supine <> sit, sit <> supine bed mobility with min assist, patient transfers sit to stand with min assist with Rw. Patient is ambulating with RW with min assist x 20 feet with decreased gait speed and VC for safety. He performs marching and LAQ bilaterally x 20 reps with fatigue. He will benefit from skilled PT to improve strength and mobility.     Follow Up Recommendations Home health PT    Equipment Recommendations  Rolling walker with 5" wheels    Recommendations for Other Services       Precautions / Restrictions Precautions Precautions: None Restrictions Weight Bearing Restrictions: No      Mobility  Bed Mobility Overal bed mobility: Needs Assistance Bed Mobility: Supine to Sit;Sit to Supine     Supine to sit: Min assist Sit to supine: Min assist      Transfers Overall transfer level: Needs assistance Equipment used: Rolling walker (2 wheeled)             General transfer comment: needs VC for sequencing  Ambulation/Gait Ambulation/Gait assistance: Min assist Gait Distance (Feet): 20 Feet Assistive device: Rolling walker (2 wheeled) Gait Pattern/deviations: Step-to pattern        Stairs            Wheelchair Mobility    Modified Rankin (Stroke Patients Only)       Balance Overall balance assessment: Mild deficits observed, not formally tested                                            Pertinent Vitals/Pain Pain Assessment: No/denies pain    Home Living Family/patient expects to be discharged to:: Private residence Living Arrangements: Spouse/significant other Available Help at Discharge: Family Type of Home: House Home Access: Stairs to enter Entrance Stairs-Rails: Right Entrance Stairs-Number of Steps: 2 Home Layout: One level Home Equipment: Environmental consultant - 2 wheels;Shower seat      Prior Function Level of Independence: Independent with assistive device(s)               Hand Dominance   Dominant Hand: Right    Extremity/Trunk Assessment   Upper Extremity Assessment Upper Extremity Assessment: Generalized weakness    Lower Extremity Assessment Lower Extremity Assessment: Generalized weakness       Communication   Communication: No difficulties  Cognition Arousal/Alertness: Awake/alert Behavior During Therapy: WFL for tasks assessed/performed Overall Cognitive Status: History of cognitive impairments - at baseline                                        General Comments      Exercises General Exercises - Lower Extremity Long Arc Quad: Both;20 reps Hip Flexion/Marching: Both;20 reps  Assessment/Plan    PT Assessment Patient needs continued PT services  PT Problem List Decreased strength;Decreased activity tolerance;Decreased balance;Decreased mobility;Decreased knowledge of use of DME;Decreased safety awareness       PT Treatment Interventions Gait training;Stair training;Therapeutic activities;Therapeutic exercise;Balance training    PT Goals (Current goals can be found in the Care Plan section)  Acute Rehab PT Goals Patient Stated Goal: (to walk better) PT Goal Formulation: With patient/family Time For Goal Achievement: 01/20/19    Frequency Min 2X/week   Barriers to discharge        Co-evaluation               AM-PAC PT "6 Clicks" Mobility   Outcome Measure Help needed turning from your back to your side while in a flat bed without using bedrails?: A Little Help needed moving from lying on your back to sitting on the side of a flat bed without using bedrails?: A Little Help needed moving to and from a bed to a chair (including a wheelchair)?: A Little Help needed standing up from a chair using your arms (e.g., wheelchair or bedside chair)?: A Little Help needed to walk in hospital room?: A Little Help needed climbing 3-5 steps with a railing? : A Little 6 Click Score: 18    End of Session Equipment Utilized During Treatment: Gait belt Activity Tolerance: Patient limited by fatigue Patient left: in bed;with chair alarm set   PT Visit Diagnosis: Muscle weakness (generalized) (M62.81);Difficulty in walking, not elsewhere classified (R26.2)    Time: 1000-1030 PT Time Calculation (min) (ACUTE ONLY): 30 min   Charges:   PT Evaluation $PT Eval Low Complexity: 1 Low PT Treatments $Gait Training: 8-22 mins $Therapeutic Exercise: 8-22 mins         Alanson Puls, PT DPT 01/06/2019, 10:41 AM

## 2019-01-06 NOTE — ED Notes (Signed)
Lab called re COVID swab, had to rerun specimen and will result shortly

## 2019-01-06 NOTE — Progress Notes (Signed)
[place] at [place] NAME: Russell Terry    MR#:  [id number]  DATE OF BIRTH:  31-Mar-1941  SUBJECTIVE:   No acute issues here with AMS MS at baseline  Wife at bedside  REVIEW OF SYSTEMS:     Review of Systems  Constitutional: Negative for fever, chills weight loss HENT: Negative for ear pain, nosebleeds, congestion, facial swelling, rhinorrhea, neck pain, neck stiffness and ear discharge.   Respiratory: Negative for cough, shortness of breath, wheezing  Cardiovascular: Negative for chest pain, palpitations and leg swelling.  Gastrointestinal: Negative for heartburn, abdominal pain, vomiting, diarrhea or consitpation Genitourinary: Negative for dysuria, urgency, frequency, hematuria Musculoskeletal: Negative for back pain or joint pain Neurological: Negative for dizziness, seizures, syncope, focal weakness,  numbness and headaches.  Hematological: Does not bruise/bleed easily.  Psychiatric/Behavioral: Negative for hallucinations, confusion, dysphoric mood    Tolerating Diet:yes      DRUG ALLERGIES:   Allergies  Allergen Reactions  . Quinapril Cough  . Ambien [Zolpidem Tartrate] Other (See Comments)    Unspecified reaction  . Fenofibrate Other (See Comments)    Unknown  . Zolpidem     Other reaction(s): Other (See Comments) Unspecified reaction  . Amoxicillin-Pot Clavulanate Nausea And Vomiting  . Morphine Nausea Only    VITALS:  Blood pressure (!) 168/91, pulse 69, temperature 98.8 F (37.1 C), temperature source Oral, resp. rate 18, height 5\' 9"  (1.753 m), weight 76.2 kg, SpO2 97 %.  PHYSICAL EXAMINATION:  Constitutional: Appears well-developed and well-nourished. No distress. HENT: Normocephalic. Marland Kitchen Oropharynx is clear and moist.  Eyes: Conjunctivae and EOM are normal. PERRLA, no scleral icterus.  Neck: Normal ROM. Neck supple. No JVD. No tracheal deviation. CVS: RRR, S1/S2 +, no murmurs, no gallops, no carotid  bruit.  Pulmonary: Effort and breath sounds normal, no stridor, rhonchi, wheezes, rales.  Abdominal: Soft. BS +,  no distension, tenderness, rebound or guarding.  Musculoskeletal: Normal range of motion. No edema and no tenderness.  Neuro: Alert. CN 2-12 grossly intact. No focal deficits. Skin: Skin is warm and dry. No rash noted. Psychiatric: Normal mood and affect.      LABORATORY PANEL:   CBC Recent Labs  Lab 01/06/19 0506  WBC 10.3  HGB 13.7  HCT 40.0  PLT 144*   ------------------------------------------------------------------------------------------------------------------  Chemistries  Recent Labs  Lab 01/05/19 1558 01/06/19 0506  NA 131* 133*  K 3.7 3.7  CL 101 103  CO2 20* 23  GLUCOSE 105* 94  BUN 14 17  CREATININE 1.13 1.23  CALCIUM 8.5* 8.2*  AST 14*  --   ALT 5  --   ALKPHOS 61  --   BILITOT 1.2  --    ------------------------------------------------------------------------------------------------------------------  Cardiac Enzymes No results for input(s): TROPONINI in the last 168 hours. ------------------------------------------------------------------------------------------------------------------  RADIOLOGY:  Dg Chest Portable 1 View  Result Date: 01/05/2019 CLINICAL DATA:  Cough and fever EXAM: PORTABLE CHEST 1 VIEW COMPARISON:  August 08, 2016 FINDINGS: The heart size and mediastinal contours are within normal limits. Both lungs are clear. The visualized skeletal structures are unremarkable. There is scattered areas of pleuroparenchymal scarring bilaterally. There may be mild emphysematous changes bilaterally. IMPRESSION: No active disease. Electronically Signed   By: Constance Holster M.D.   On: 01/05/2019 19:32     ASSESSMENT AND PLAN:   78 year old male with PAF, BPH and dementia with Parkinson's disease who presented to the ER due to fever and malaise.   1.  Sepsis: Patient presented  with fever and tachypnea.  Sepsis is due to  urinary tract infection.  Sepsis has resolved.  2.  UTI: This is etiology of patient's malaise and fever  Patient has had Pseudomonas which has been resistant to oral antibiotics in the past.  Will await final urine culture  3.  Hyponatremia: This is improved with IV fluids and is related to poor p.o. intake  4.  Parkinson's disease: Continue Sinemet  5.  Dementia: Continue Aricept  6.  BPH: Continue Flomax ,Cardura and Avodart     Management plans discussed with the patient and wife and they are  in agreement.  CODE STATUS: DNR  TOTAL TIME TAKING CARE OF THIS PATIENT: 30 minutes.     POSSIBLE D/C tomorrow, DEPENDING ON CLINICAL CONDITION.   Bettey Costa M.D on 01/06/2019 at 10:56 AM  Between 7am to 6pm - Pager - [phone] After 6pm go to [email or web address] - password [email or web address] [facility]  Office  [phone]  CC: Primary care physician; [place] Pink, MD  Note: This dictation was prepared with Dragon dictation along with smaller phrase technology. Any transcriptional errors that result from this process are unintentional.

## 2019-01-06 NOTE — Consult Note (Signed)
Pharmacy Antibiotic Note  Russell Terry is a 78 y.o. male admitted on 01/05/2019 with fever and cough.  Pharmacy has been consulted for cefepime dosing for UTI. Patient has Hx of Pseudomonas UTI.  Plan: Start Cefepime 2g IV every 12 hours.   Height: 5\' 9"  (175.3 cm) Weight: 168 lb (76.2 kg) IBW/kg (Calculated) : 70.7  Temp (24hrs), Avg:99.9 F (37.7 C), Min:98.5 F (36.9 C), Max:102 F (38.9 C)  Recent Labs  Lab 01/05/19 1558  WBC 10.7*  CREATININE 1.13  LATICACIDVEN 1.2    Estimated Creatinine Clearance: 53.9 mL/min (by C-G formula based on SCr of 1.13 mg/dL).    Allergies  Allergen Reactions  . Quinapril Cough  . Ambien [Zolpidem Tartrate] Other (See Comments)    Unspecified reaction  . Fenofibrate Other (See Comments)    Unknown  . Zolpidem     Other reaction(s): Other (See Comments) Unspecified reaction  . Amoxicillin-Pot Clavulanate Nausea And Vomiting  . Morphine Nausea Only    Antimicrobials this admission: 6/23 cefepime  >>   Microbiology results: 6/24 UCx: pending  Thank you for allowing pharmacy to be a part of this patient's care.  Pernell Dupre, PharmD, BCPS Clinical Pharmacist 01/06/2019 2:35 AM

## 2019-01-06 NOTE — ED Notes (Signed)
Patient transferred to 1C with support person (wife); patient in good condition. 1C staff informed that patient has dementia and is often confused- support person has been pre-screened

## 2019-01-06 NOTE — ED Notes (Signed)
ED TO INPATIENT HANDOFF REPORT  ED Nurse Name and Phone #: Anda Kraft [phone]  S Name/Age/Gender Russell Terry 78 y.o. male Room/Bed: ED10A/ED10A  Code Status   Code Status: Full Code  Home/SNF/Other Home Patient oriented to: self and place Is this baseline? Yes   Triage Complete: Triage complete  Chief Complaint fever  Triage Note Patient reports having cough earlier this morning. States he took his temperature and had a fever. Patient denies contact with anyone who has been sick. Reports mild nausea and body aches.    Allergies Allergies  Allergen Reactions  . Quinapril Cough  . Ambien [Zolpidem Tartrate] Other (See Comments)    Unspecified reaction  . Fenofibrate Other (See Comments)    Unknown  . Zolpidem     Other reaction(s): Other (See Comments) Unspecified reaction  . Amoxicillin-Pot Clavulanate Nausea And Vomiting  . Morphine Nausea Only    Level of Care/Admitting Diagnosis ED Disposition    ED Disposition Condition [facility] Area: [place] [[id number]]  Level of Care: Med-Surg [16]  Covid Evaluation: Confirmed COVID Negative  Diagnosis: UTI (urinary tract infection) [426834]  Admitting Physician: Mayer Camel [[id number]]  Attending Physician: Mayer Camel [[id number]]  Estimated length of stay: past midnight tomorrow  Certification:: I certify this patient will need inpatient services for at least 2 midnights  PT Class (Do Not Modify): Inpatient [101]  PT Acc Code (Do Not Modify): Private [1]       B Medical/Surgery History Past Medical History:  Diagnosis Date  . Atrial fibrillation ([place]) 12/11/2013  . BPH (benign prostatic hyperplasia)   . Bradycardia 12/11/2013  . Calculus of kidney 12/06/2013  . Cancer ([place])    rt kidney removed  . Chronic kidney disease    stones,rt kidney removed  . COPD (chronic obstructive pulmonary disease) ([place]) 12/11/2013  . DDD (degenerative disc disease), lumbar 12/13/2013  .  Delirium   . Dementia ([place])   . Elevated prostate specific antigen (PSA) 03/03/2013  . Encephalopathy acute 07/22/2016  . Enlarged prostate with lower urinary tract symptoms (LUTS) 03/03/2013  . Essential hypertension 07/22/2016  . Hyperlipidemia, unspecified 07/09/2017  . Hypertension   . Meningioma ([place]) 10/08/2016  . Parkinsonian features 10/08/2016  . Pleuritic chest pain 05/31/2014  . Renal cell carcinoma ([place]) 05/04/2016  . Renal cyst, acquired, left 04/22/2015   Past Surgical History:  Procedure Laterality Date  . BRAIN SURGERY  2017  . COLON SURGERY    . COLONOSCOPY WITH PROPOFOL N/A 12/31/2016   Procedure: COLONOSCOPY WITH PROPOFOL;  Surgeon: Lollie Sails, MD;  Location: [facility] ENDOSCOPY;  Service: Endoscopy;  Laterality: N/A;  . [place]    . LUMBAR LAMINECTOMY/DECOMPRESSION MICRODISCECTOMY N/A 10/18/2015   Procedure: LUMBAR LAMINECTOMY/DECOMPRESSION MICRODISCECTOMY 3 LEVELS;  Surgeon: Newman Pies, MD;  Location: [facility] NEURO ORS;  Service: Neurosurgery;  Laterality: N/A;  L23 L34 L45 laminectomy and foraminotomy  . NEPHRECTOMY     2012  . URETEROSCOPY WITH HOLMIUM LASER LITHOTRIPSY       A IV Location/Drains/Wounds Patient Lines/Drains/Airways Status   Active Line/Drains/Airways    Name:   Placement date:   Placement time:   Site:   Days:   Peripheral IV 01/05/19 Left Antecubital   01/05/19    1942    Antecubital   1          Intake/Output Last 24 hours  Intake/Output Summary (Last 24 hours) at 01/06/2019 0301 Last data filed at 01/06/2019 9417 Gross  per 24 hour  Intake -  Output 120 ml  Net -120 ml    Labs/Imaging Results for orders placed or performed during the hospital encounter of 01/05/19 (from the past 48 hour(s))  Lactic acid, plasma     Status: None   Collection Time: 01/05/19  3:58 PM  Result Value Ref Range   Lactic Acid, Venous 1.2 0.5 - 1.9 mmol/L    Comment: Performed at [facility], [address] [place],  [place] [address]  Comprehensive metabolic panel     Status: Abnormal   Collection Time: 01/05/19  3:58 PM  Result Value Ref Range   Sodium 131 (L) 135 - 145 mmol/L   Potassium 3.7 3.5 - 5.1 mmol/L   Chloride 101 98 - 111 mmol/L   CO2 20 (L) 22 - 32 mmol/L   Glucose, Bld 105 (H) 70 - 99 mg/dL   BUN 14 8 - 23 mg/dL   Creatinine, Ser 1.13 0.61 - 1.24 mg/dL   Calcium 8.5 (L) 8.9 - 10.3 mg/dL   Total Protein 6.5 6.5 - 8.1 g/dL   Albumin 4.0 3.5 - 5.0 g/dL   AST 14 (L) 15 - 41 U/L   ALT 5 0 - 44 U/L   Alkaline Phosphatase 61 38 - 126 U/L   Total Bilirubin 1.2 0.3 - 1.2 mg/dL   GFR calc non Af Amer >60 >60 mL/min   GFR calc Af Amer >60 >60 mL/min   Anion gap 10 5 - 15    Comment: Performed at [facility], [address] [place], [place] [address]  CBC with Differential     Status: Abnormal   Collection Time: 01/05/19  3:58 PM  Result Value Ref Range   WBC 10.7 (H) 4.0 - 10.5 K/uL   RBC 4.31 4.22 - 5.81 MIL/uL   Hemoglobin 14.0 13.0 - 17.0 g/dL   HCT 41.0 39.0 - 52.0 %   MCV 95.1 80.0 - 100.0 fL   MCH 32.5 26.0 - 34.0 pg   MCHC 34.1 30.0 - 36.0 g/dL   RDW 13.9 11.5 - 15.5 %   Platelets 149 (L) 150 - 400 K/uL   nRBC 0.0 0.0 - 0.2 %   Neutrophils Relative % 86 %   Neutro Abs 9.4 (H) 1.7 - 7.7 K/uL   Lymphocytes Relative 4 %   Lymphs Abs 0.4 (L) 0.7 - 4.0 K/uL   Monocytes Relative 7 %   Monocytes Absolute 0.8 0.1 - 1.0 K/uL   Eosinophils Relative 1 %   Eosinophils Absolute 0.1 0.0 - 0.5 K/uL   Basophils Relative 1 %   Basophils Absolute 0.1 0.0 - 0.1 K/uL   Immature Granulocytes 1 %   Abs Immature Granulocytes 0.08 (H) 0.00 - 0.07 K/uL    Comment: Performed at [facility], [address] [place],  [address]  Urinalysis, Complete w Microscopic     Status: Abnormal   Collection Time: 01/05/19  3:59 PM  Result Value Ref Range   Color, Urine AMBER (A) YELLOW    Comment: BIOCHEMICALS MAY BE AFFECTED BY COLOR   APPearance HAZY (A) CLEAR   Specific Gravity,  Urine 1.024 1.005 - 1.030   pH 5.0 5.0 - 8.0   Glucose, UA NEGATIVE NEGATIVE mg/dL   Hgb urine dipstick SMALL (A) NEGATIVE   Bilirubin Urine NEGATIVE NEGATIVE   Ketones, ur 5 (A) NEGATIVE mg/dL   Protein, ur 100 (A) NEGATIVE mg/dL   Nitrite NEGATIVE NEGATIVE   Leukocytes,Ua LARGE (A)  NEGATIVE   RBC / HPF 21-50 0 - 5 RBC/hpf   WBC, UA >50 (H) 0 - 5 WBC/hpf   Bacteria, UA RARE (A) NONE SEEN   Squamous Epithelial / LPF 0-5 0 - 5   Mucus PRESENT     Comment: Performed at [facility], [address], [place], [place] [address]  SARS Coronavirus 2 (CEPHEID- Performed in [place] hospital lab), Hosp Order     Status: None   Collection Time: 01/05/19  7:43 PM   Specimen: Nasopharyngeal Swab  Result Value Ref Range   SARS Coronavirus 2 NEGATIVE NEGATIVE    Comment: (NOTE) If result is NEGATIVE SARS-CoV-2 target nucleic acids are NOT DETECTED. The SARS-CoV-2 RNA is generally detectable in upper and lower  respiratory specimens during the acute phase of infection. The lowest  concentration of SARS-CoV-2 viral copies this assay can detect is 250  copies / mL. A negative result does not preclude SARS-CoV-2 infection  and should not be used as the sole basis for treatment or other  patient management decisions.  A negative result may occur with  improper specimen collection / handling, submission of specimen other  than nasopharyngeal swab, presence of viral mutation(s) within the  areas targeted by this assay, and inadequate number of viral copies  (<250 copies / mL). A negative result must be combined with clinical  observations, patient history, and epidemiological information. If result is POSITIVE SARS-CoV-2 target nucleic acids are DETECTED. The SARS-CoV-2 RNA is generally detectable in upper and lower  respiratory specimens dur ing the acute phase of infection.  Positive  results are indicative of active infection with SARS-CoV-2.  Clinical  correlation with patient  history and other diagnostic information is  necessary to determine patient infection status.  Positive results do  not rule out bacterial infection or co-infection with other viruses. If result is PRESUMPTIVE POSTIVE SARS-CoV-2 nucleic acids MAY BE PRESENT.   A presumptive positive result was obtained on the submitted specimen  and confirmed on repeat testing.  While 2019 novel coronavirus  (SARS-CoV-2) nucleic acids may be present in the submitted sample  additional confirmatory testing may be necessary for epidemiological  and / or clinical management purposes  to differentiate between  SARS-CoV-2 and other Sarbecovirus currently known to infect humans.  If clinically indicated additional testing with an alternate test  methodology [phone]) is advised. The SARS-CoV-2 RNA is generally  detectable in upper and lower respiratory sp ecimens during the acute  phase of infection. The expected result is Negative. Fact Sheet for Patients:  StrictlyIdeas.no Fact Sheet for Healthcare Providers: BankingDealers.co.za This test is not yet approved or cleared by the [place] FDA and has been authorized for detection and/or diagnosis of SARS-CoV-2 by FDA under an Emergency Use Authorization (EUA).  This EUA will remain in effect (meaning this test can be used) for the duration of the COVID-19 declaration under Section 564(b)(1) of the Act, 21 U.S.C. section 360bbb-3(b)(1), unless the authorization is terminated or revoked sooner. Performed at [facility], [address] [place],  [address]    Dg Chest Portable 1 View  Result Date: 01/05/2019 CLINICAL DATA:  Cough and fever EXAM: PORTABLE CHEST 1 VIEW COMPARISON:  August 08, 2016 FINDINGS: The heart size and mediastinal contours are within normal limits. Both lungs are clear. The visualized skeletal structures are unremarkable. There is scattered areas of pleuroparenchymal  scarring bilaterally. There may be mild emphysematous changes bilaterally. IMPRESSION: No active disease. Electronically Signed  By: Constance Holster M.D.   On: 01/05/2019 19:32    Pending Labs Unresulted Labs (From admission, onward)    Start     Ordered   01/12/19 0500  Creatinine, serum  (enoxaparin (LOVENOX)    CrCl >/= 30 ml/min)  Weekly,   STAT    Comments: while on enoxaparin therapy    01/05/19 2304   01/06/19 1856  Basic metabolic panel  Tomorrow morning,   STAT     01/05/19 2304   01/06/19 0500  CBC  Tomorrow morning,   STAT     01/05/19 2304   01/05/19 1911  Urine Culture  Add-on,   AD     01/05/19 1910          Vitals/Pain Today's Vitals   01/06/19 0151 01/06/19 0200 01/06/19 0215 01/06/19 0230  BP:  132/79    Pulse:  60 65 (!) 59  Resp:  18 18 17   Temp:      TempSrc:      SpO2:  94% 95% 91%  Weight:      Height:      PainSc: Asleep       Isolation Precautions No active isolations  Medications Medications  mirabegron ER (MYRBETRIQ) tablet 50 mg (has no administration in time range)  tamsulosin (FLOMAX) capsule 0.8 mg (0.8 mg Oral Given 01/06/19 0029)  aspirin EC tablet 81 mg (has no administration in time range)  carbidopa-levodopa (SINEMET IR) 25-100 MG per tablet immediate release 2 tablet (has no administration in time range)  diltiazem (CARDIZEM CD) 24 hr capsule 240 mg (has no administration in time range)  donepezil (ARICEPT) tablet 10 mg (10 mg Oral Given 01/06/19 0030)  doxazosin (CARDURA) tablet 4 mg (has no administration in time range)  dutasteride (AVODART) capsule 0.5 mg (has no administration in time range)  gabapentin (NEURONTIN) capsule 300 mg (has no administration in time range)  Melatonin TABS 2.5 mg (has no administration in time range)  pantoprazole (PROTONIX) EC tablet 40 mg (has no administration in time range)  traZODone (DESYREL) tablet 100 mg (100 mg Oral Given 01/06/19 0030)  venlafaxine XR (EFFEXOR-XR) 24 hr capsule 75 mg  (has no administration in time range)  vitamin B-12 (CYANOCOBALAMIN) tablet 500 mcg (has no administration in time range)  enoxaparin (LOVENOX) injection 40 mg (has no administration in time range)  sodium chloride flush (NS) 0.9 % injection 3 mL (has no administration in time range)  0.9 %  sodium chloride infusion ( Intravenous New Bag/Given 01/06/19 0002)  acetaminophen (TYLENOL) tablet 650 mg (has no administration in time range)    Or  acetaminophen (TYLENOL) suppository 650 mg (has no administration in time range)  polyethylene glycol (MIRALAX / GLYCOLAX) packet 17 g (has no administration in time range)  ondansetron (ZOFRAN) tablet 4 mg (has no administration in time range)    Or  ondansetron (ZOFRAN) injection 4 mg (has no administration in time range)  ceFEPIme (MAXIPIME) 2 g in sodium chloride 0.9 % 100 mL IVPB (has no administration in time range)  acetaminophen (TYLENOL) tablet 650 mg (650 mg Oral Given 01/05/19 1541)  ceFEPIme (MAXIPIME) 2 g in sodium chloride 0.9 % 100 mL IVPB (0 g Intravenous Stopped 01/05/19 2057)    Mobility walks with person assist Low fall risk   Focused Assessments Pulmonary Assessment Handoff:  Lung sounds:   O2 Device: Room Air        R Recommendations: See Admitting Provider Note  Report given to:   Additional  Notes:

## 2019-01-06 NOTE — Evaluation (Addendum)
Clinical/Bedside Swallow Evaluation Patient Details  Name: Russell Terry MRN: [id number] Date of Birth: March 30, 1941  Today's Date: 01/06/2019 Time: SLP Start Time (ACUTE ONLY): 0910 SLP Stop Time (ACUTE ONLY): 1010 SLP Time Calculation (min) (ACUTE ONLY): 60 min  Past Medical History:  Past Medical History:  Diagnosis Date  . Atrial fibrillation ([place]) 12/11/2013  . BPH (benign prostatic hyperplasia)   . Bradycardia 12/11/2013  . Calculus of kidney 12/06/2013  . Cancer ([place])    rt kidney removed  . Chronic kidney disease    stones,rt kidney removed  . COPD (chronic obstructive pulmonary disease) ([place]) 12/11/2013  . DDD (degenerative disc disease), lumbar 12/13/2013  . Delirium   . Dementia ([place])   . Elevated prostate specific antigen (PSA) 03/03/2013  . Encephalopathy acute 07/22/2016  . Enlarged prostate with lower urinary tract symptoms (LUTS) 03/03/2013  . Essential hypertension 07/22/2016  . Hyperlipidemia, unspecified 07/09/2017  . Hypertension   . Meningioma ([place]) 10/08/2016  . Parkinsonian features 10/08/2016  . Pleuritic chest pain 05/31/2014  . Renal cell carcinoma ([place]) 05/04/2016  . Renal cyst, acquired, left 04/22/2015   Past Surgical History:  Past Surgical History:  Procedure Laterality Date  . BRAIN SURGERY  2017  . COLON SURGERY    . COLONOSCOPY WITH PROPOFOL N/A 12/31/2016   Procedure: COLONOSCOPY WITH PROPOFOL;  Surgeon: Lollie Sails, MD;  Location: [facility] ENDOSCOPY;  Service: Endoscopy;  Laterality: N/A;  . [place]    . LUMBAR LAMINECTOMY/DECOMPRESSION MICRODISCECTOMY N/A 10/18/2015   Procedure: LUMBAR LAMINECTOMY/DECOMPRESSION MICRODISCECTOMY 3 LEVELS;  Surgeon: Newman Pies, MD;  Location: [facility] NEURO ORS;  Service: Neurosurgery;  Laterality: N/A;  L23 L34 L45 laminectomy and foraminotomy  . NEPHRECTOMY     2012  . URETEROSCOPY WITH HOLMIUM LASER LITHOTRIPSY     HPI:  Pt is a 78 y.o. male with a known history of atrial fibrillation, BPH,  dementia, Parkinson's disease, hypertension.  Patient was brought to the emergency room today by his wife noting fever of 102 earlier in the day with increased fatigue and malaise over the last 2 days.  She has noted decreased oral intake with complaints of nausea as well as body aches and chills.  He has a history of urinary tract infections with similar symptoms associated.  Patient denies shortness of breath or cough.  He denies chest pain.  He has experienced no episodes of vomiting.  Urinalysis demonstrates large amount leukocytes and WBCs as well as bacteria.  WBC is 10.7 with lactic acid 1.2.  Chest x-ray shows no active pulmonary disease.  Pt was started on IC antibiotic tx. CXR: No active disease.  Assessment / Plan / Recommendation Clinical Impression  Pt appears to present w/ an adequate oropharyngeal phase swallow function w/ no overt s/s of aspiration noted during this evaluation; at reduced risk for aspiration when following general aspiration precautions. Pt does have min concern when swallowing Pills that he feels "are difficult to get down sometimes". Pt helped to position upright in bed for assessment. OM exam appeared grossly [facility] w/ lingual/labial strength though min decreased coordination in connected, continuous lingual movements noted. Pt does have a baseline dx of Parkinson's Dis. per chart/Wife. Pt consumed po trials of thin liquids, purees, and soft solids w/ no overt s/s of aspiration noted; no decline in vocal quality or respiratory status noted during/post trials. Laryngeal excursion during the swallow appeared [facility]. Oral phase appeared [facility] for bolus management and timely A-P transfer; oral clearing was complete post swallows. Pt  fed self given min setup assistance.  Recommend continue the current diet as ordered w/ general aspiration precautions. Discussed w/ pt and wife and recommended Pills Whole in Puree for easier, safer swallowing from this time forward in light of min difficulty  now and baseline Parkinson's Dis. Discussed food consistencies and monitoring small bites and complete chewing of bulky, thick foods such as meats and breads also. No further skilled ST Services indicated currently; NSG to reconsult if any decline in status while admitted.  SLP Visit Diagnosis: Dysphagia, unspecified (R13.10)    Aspiration Risk  (reduced following precautions)    Diet Recommendation  Regular diet w/ cut meats, moistened/cooked foods; Thin liquids. General aspiration precautions.  Medication Administration: Whole meds with puree(for easier, safer swallowing)    Other  Recommendations Recommended Consults: (Dietician f/u as needed) Oral Care Recommendations: Oral care BID;Patient independent with oral care Other Recommendations: (n/a)   Follow up Recommendations None      Frequency and Duration (n/a)  (n/a)       Prognosis Prognosis for Safe Diet Advancement: Good Barriers to Reach Goals: (baseline Parkinson's Dis.)      Swallow Study   General Date of Onset: 01/05/19 HPI: Pt is a 78 y.o. male with a known history of atrial fibrillation, BPH, dementia, Parkinson's disease, hypertension.  Patient was brought to the emergency room today by his wife noting fever of 102 earlier in the day with increased fatigue and malaise over the last 2 days.  She has noted decreased oral intake with complaints of nausea as well as body aches and chills.  He has a history of urinary tract infections with similar symptoms associated.  Patient denies shortness of breath or cough.  He denies chest pain.  He has experienced no episodes of vomiting.  Urinalysis demonstrates large amount leukocytes and WBCs as well as bacteria.  WBC is 10.7 with lactic acid 1.2.  Chest x-ray shows no active pulmonary disease.  Pt was started on IC antibiotic tx.  Type of Study: Bedside Swallow Evaluation Previous Swallow Assessment: none Diet Prior to this Study: Regular;Thin liquids Temperature Spikes Noted:  No(wbc 10.3) Respiratory Status: Room air History of Recent Intubation: No Behavior/Cognition: Alert;Cooperative;Pleasant mood Oral Cavity Assessment: Within Functional Limits Oral Care Completed by SLP: Recent completion by staff Oral Cavity - Dentition: Adequate natural dentition Vision: Functional for self-feeding Self-Feeding Abilities: Able to feed self;Needs assist;Needs set up Patient Positioning: Upright in bed(needed positioning) Baseline Vocal Quality: Normal Volitional Cough: Strong Volitional Swallow: Able to elicit    Oral/Motor/Sensory Function Overall Oral Motor/Sensory Function: Within functional limits(grossly; min discoordination in lingual movements)   Ice Chips Ice chips: Within functional limits Presentation: Spoon(fed; 3 trials)   Thin Liquid Thin Liquid: Within functional limits Presentation: Cup;Self Fed;Straw(~5-6 ozs total)    Nectar Thick Nectar Thick Liquid: Not tested   Honey Thick Honey Thick Liquid: Not tested   Puree Puree: Within functional limits Presentation: Self Fed;Spoon(~3 ozs)   Solid     Solid: Within functional limits Presentation: Self Fed;Spoon(5 trials - moistened foods)       Orinda Kenner, MS, CCC-SLP Corlis Angelica 01/06/2019,2:59 PM

## 2019-01-07 LAB — URINE CULTURE: Culture: >=100000 — AB

## 2019-01-07 MED ORDER — RISPERIDONE 1 MG PO TBDP
0.5000 mg | ORAL_TABLET | Freq: Every day | ORAL | Status: DC
  Administered 2019-01-07: 0.5 mg via ORAL
  Filled 2019-01-07 (×2): qty 0.5

## 2019-01-07 MED ORDER — TAMSULOSIN HCL 0.4 MG PO CAPS
0.4000 mg | ORAL_CAPSULE | Freq: Two times a day (BID) | ORAL | Status: DC
  Administered 2019-01-07 – 2019-01-08 (×2): 0.4 mg via ORAL
  Filled 2019-01-07 (×2): qty 1

## 2019-01-07 NOTE — TOC Progression Note (Signed)
Transition of Care [facility]) - Progression Note    Patient Details  Name: Russell Terry MRN: [id number] Date of Birth: [birth year]  Transition of Care [facility]) CM/SW Contact  Shelbie Hutching, RN Phone Number: 01/07/2019, 2:07 PM  Clinical Narrative:    Patient sitting up in bed, wife is at the bedside.  Wife and patient agree on home health and choose [facility].  Floydene Flock with [facility] has accepted the referral.  Before discharge urine culture sensitivities need to come back.  There is a possibility that patient may need IV antibiotics at home.     Expected Discharge Plan: [place] Barriers to Discharge: Continued Medical Work up  Expected Discharge Plan and Services Expected Discharge Plan: [place]   Discharge Planning Services: CM Consult Post Acute Care Choice: [place] arrangements for the past 2 months: [place]: RN, PT [facility] Agency: [place] ([facility]) Date [place]: 01/07/19 Time [place]: [place] Representative spoke with at [place]: [place] (SDOH) Interventions    Readmission Risk Interventions No flowsheet data found.

## 2019-01-07 NOTE — Progress Notes (Signed)
[place] at [place] NAME: Russell Terry    MR#:  [id number]  DATE OF BIRTH:  10/11/1940  SUBJECTIVE:   Patient with agitation last night due to dementia Wife at bedside would like to be discahrged today if possible  REVIEW OF SYSTEMS:     Review of Systems  Constitutional: Negative for fever, chills weight loss HENT: Negative for ear pain, nosebleeds, congestion, facial swelling, rhinorrhea, neck pain, neck stiffness and ear discharge.   Respiratory: Negative for cough, shortness of breath, wheezing  Cardiovascular: Negative for chest pain, palpitations and leg swelling.  Gastrointestinal: Negative for heartburn, abdominal pain, vomiting, diarrhea or consitpation Genitourinary: Negative for dysuria, urgency, frequency, hematuria Musculoskeletal: Negative for back pain or joint pain Neurological: Negative for dizziness, seizures, syncope, focal weakness,  numbness and headaches.  Hematological: Does not bruise/bleed easily.  Psychiatric/Behavioral: Negative for hallucinations, confusion, dysphoric mood    Tolerating Diet:yes      DRUG ALLERGIES:   Allergies  Allergen Reactions  . Quinapril Cough  . Ambien [Zolpidem Tartrate] Other (See Comments)    Unspecified reaction  . Fenofibrate Other (See Comments)    Unknown  . Zolpidem     Other reaction(s): Other (See Comments) Unspecified reaction  . Amoxicillin-Pot Clavulanate Nausea And Vomiting  . Morphine Nausea Only    VITALS:  Blood pressure (!) 188/92, pulse (!) 54, temperature 97.8 F (36.6 C), temperature source Oral, resp. rate 16, height 5\' 9"  (1.753 m), weight 76.2 kg, SpO2 98 %.  PHYSICAL EXAMINATION:  Constitutional: Appears well-developed and well-nourished. No distress. HENT: Normocephalic. Marland Kitchen Oropharynx is clear and moist.  Eyes: Conjunctivae and EOM are normal. PERRLA, no scleral icterus.  Neck: Normal ROM. Neck supple. No JVD. No tracheal  deviation. CVS: RRR, S1/S2 +, no murmurs, no gallops, no carotid bruit.  Pulmonary: Effort and breath sounds normal, no stridor, rhonchi, wheezes, rales.  Abdominal: Soft. BS +,  no distension, tenderness, rebound or guarding.  Musculoskeletal: Normal range of motion. No edema and no tenderness.  Neuro: Alert. CN 2-12 grossly intact. No focal deficits. Skin: Skin is warm and dry. No rash noted. Psychiatric: Normal mood and affect.      LABORATORY PANEL:   CBC Recent Labs  Lab 01/06/19 0506  WBC 10.3  HGB 13.7  HCT 40.0  PLT 144*   ------------------------------------------------------------------------------------------------------------------  Chemistries  Recent Labs  Lab 01/05/19 1558 01/06/19 0506  NA 131* 133*  K 3.7 3.7  CL 101 103  CO2 20* 23  GLUCOSE 105* 94  BUN 14 17  CREATININE 1.13 1.23  CALCIUM 8.5* 8.2*  AST 14*  --   ALT 5  --   ALKPHOS 61  --   BILITOT 1.2  --    ------------------------------------------------------------------------------------------------------------------  Cardiac Enzymes No results for input(s): TROPONINI in the last 168 hours. ------------------------------------------------------------------------------------------------------------------  RADIOLOGY:  Dg Chest Portable 1 View  Result Date: 01/05/2019 CLINICAL DATA:  Cough and fever EXAM: PORTABLE CHEST 1 VIEW COMPARISON:  August 08, 2016 FINDINGS: The heart size and mediastinal contours are within normal limits. Both lungs are clear. The visualized skeletal structures are unremarkable. There is scattered areas of pleuroparenchymal scarring bilaterally. There may be mild emphysematous changes bilaterally. IMPRESSION: No active disease. Electronically Signed   By: Constance Holster M.D.   On: 01/05/2019 19:32     ASSESSMENT AND PLAN:   78 year old male with PAF, BPH and dementia with Parkinson's disease who presented to the ER due to fever and malaise.  1.  Sepsis:  Patient presented with fever and tachypnea.  Sepsis is due to urinary tract infection.  Sepsis has resolved.  2.  UTI: This is etiology of patient's malaise and fever  Patient has had Pseudomonas which has been resistant to oral antibiotics in the past.   Called lab patient has Pseudomonas but sensitivities are pending. Patient may need PICC line and IV antibiotics.   3.  Hyponatremia: This is improved.  4.  Parkinson's disease: Continue Sinemet  5.  Dementia: Continue Aricept Add PRN Risperdal at night for agitation. 6.  BPH: Continue Flomax ,Cardura and Avodart     Management plans discussed with the patient and wife and they are  in agreement.  CODE STATUS: DNR  TOTAL TIME TAKING CARE OF THIS PATIENT: 24 minutes.     POSSIBLE D/C tomorrow, DEPENDING ON CLINICAL CONDITION.   Bettey Costa M.D on 01/07/2019 at 11:25 AM  Between 7am to 6pm - Pager - [phone] After 6pm go to [email or web address] - password [email or web address] [facility]  Office  [phone]  CC: Primary care physician; [place] Pink, MD  Note: This dictation was prepared with Dragon dictation along with smaller phrase technology. Any transcriptional errors that result from this process are unintentional.

## 2019-01-07 NOTE — TOC Progression Note (Signed)
Transition of Care [facility]) - Progression Note    Patient Details  Name: Russell Terry MRN: [id number] Date of Birth: 1940/09/02  [facility]) CM/SW Contact  Shelbie Hutching, RN Phone Number: 01/07/2019, 2:28 PM  Clinical Narrative:    Carolynn Sayers with [facility] [phone] given heads up that patient may need IV antibiotics at discharge.   Expected Discharge Plan: [place] Barriers to Discharge: Continued Medical Work up  Expected Discharge Plan and Services Expected Discharge Plan: [place]   Discharge Planning Services: CM Consult Post Acute Care Choice: [place] arrangements for the past 2 months: [place]: RN, PT [facility] Agency: [place] ([facility]) Date [place]: 01/07/19 Time [place]: [place] Representative spoke with at [place]: [place] (SDOH) Interventions    Readmission Risk Interventions No flowsheet data found.

## 2019-01-08 ENCOUNTER — Other Ambulatory Visit: Payer: Self-pay | Admitting: *Deleted

## 2019-01-08 MED ORDER — FOSFOMYCIN TROMETHAMINE 3 G PO PACK: 3.0000 g | PACK | ORAL | Status: DC

## 2019-01-08 MED ORDER — FOSFOMYCIN TROMETHAMINE 3 G PO PACK
3.0000 g | PACK | Freq: Once | ORAL | Status: AC
  Administered 2019-01-08: 3 g via ORAL
  Filled 2019-01-08: qty 3

## 2019-01-08 MED ORDER — FOSFOMYCIN TROMETHAMINE 3 G PO PACK: 3.0000 g | PACK | ORAL | 0 refills | Status: AC

## 2019-01-08 NOTE — Consult Note (Signed)
   [facility] CM Inpatient Consult   01/08/2019  Russell Terry 26-Jul-1940 [id number]   Referral received for possible [facility] Care Management services. Chart review reveals patient is member under ACO plan with [employer] ([employer]).  [employer] members will be followed by alternate case management services. Therefore, [facility] will not follow.  For questions, please contact:  [name], MSN, [place] [facility] Liaison Nurse Mobile Phone [phone]  Toll free office [phone]

## 2019-01-08 NOTE — Discharge Summary (Signed)
[place] at [place] NAME: Russell Terry    MR#:  [id number]  DATE OF BIRTH:  [birth year]  DATE OF ADMISSION:  01/05/2019 ADMITTING PHYSICIAN: Christel Mormon, MD  DATE OF DISCHARGE: 01/08/2019  PRIMARY CARE PHYSICIAN: [place] Pink, MD    ADMISSION DIAGNOSIS:  Cystitis [N30.90] Fever, unspecified fever cause [R50.9] Dementia without behavioral disturbance, unspecified dementia type ([place]) [F03.90]  DISCHARGE DIAGNOSIS:  Active Problems:   UTI (urinary tract infection)   SECONDARY DIAGNOSIS:   Past Medical History:  Diagnosis Date  . Atrial fibrillation ([place]) 12/11/2013  . BPH (benign prostatic hyperplasia)   . Bradycardia 12/11/2013  . Calculus of kidney 12/06/2013  . Cancer ([place])    rt kidney removed  . Chronic kidney disease    stones,rt kidney removed  . COPD (chronic obstructive pulmonary disease) ([place]) 12/11/2013  . DDD (degenerative disc disease), lumbar 12/13/2013  . Delirium   . Dementia ([place])   . Elevated prostate specific antigen (PSA) 03/03/2013  . Encephalopathy acute 07/22/2016  . Enlarged prostate with lower urinary tract symptoms (LUTS) 03/03/2013  . Essential hypertension 07/22/2016  . Hyperlipidemia, unspecified 07/09/2017  . Hypertension   . Meningioma ([place]) 10/08/2016  . Parkinsonian features 10/08/2016  . Pleuritic chest pain 05/31/2014  . Renal cell carcinoma ([place]) 05/04/2016  . Renal cyst, acquired, left 04/22/2015    HOSPITAL COURSE:   78 year old male with PAF, BPH and dementia with Parkinson's disease who presented to the ER due to fever and malaise.   1.  Sepsis: Patient presented with fever and tachypnea.  Sepsis was due to Pseudomonas UTI.  Sepsis has resolved.  2.    Aeromonas UTI: This is etiology of patient's malaise and fever  He will be discharged on oral fosfomycin as per sensitivities.   3.  Hyponatremia: This has improved.  4.  Parkinson's disease: Continue Sinemet  5.  Dementia:  Continue Aricept 6.  BPH: Continue Flomax ,Cardura and Avodart   DISCHARGE CONDITIONS AND DIET:   Stable for discharge regular diet  CONSULTS OBTAINED:    DRUG ALLERGIES:   Allergies  Allergen Reactions  . Quinapril Cough  . Ambien [Zolpidem Tartrate] Other (See Comments)    Unspecified reaction  . Fenofibrate Other (See Comments)    Unknown  . Zolpidem     Other reaction(s): Other (See Comments) Unspecified reaction  . Amoxicillin-Pot Clavulanate Nausea And Vomiting  . Morphine Nausea Only    DISCHARGE MEDICATIONS:   Allergies as of 01/08/2019      Reactions   Quinapril Cough   Ambien [zolpidem Tartrate] Other (See Comments)   Unspecified reaction   Fenofibrate Other (See Comments)   Unknown   Zolpidem    Other reaction(s): Other (See Comments) Unspecified reaction   Amoxicillin-pot Clavulanate Nausea And Vomiting   Morphine Nausea Only      Medication List    STOP taking these medications   azithromycin 500 MG tablet Commonly known as: ZITHROMAX     TAKE these medications   aspirin EC 81 MG tablet Take 81 mg by mouth daily.   BENADRYL ALLERGY PO Take by mouth.   carbidopa-levodopa 25-100 MG tablet Commonly known as: SINEMET IR Take 2 tablets by mouth 3 (three) times daily.   diltiazem 240 MG 24 hr capsule Commonly known as: CARDIZEM CD TAKE 1 CAPSULE (240 MG TOTAL) BY MOUTH EVERY MORNING.   docusate sodium 100 MG capsule Commonly known as: COLACE Take 100 mg by mouth at bedtime.  donepezil 10 MG tablet Commonly known as: ARICEPT TAKE 1 TABLET BY MOUTH EVERY DAY AT NIGHT   doxazosin 4 MG tablet Commonly known as: CARDURA Take 4 mg by mouth daily.   dutasteride 0.5 MG capsule Commonly known as: AVODART Take 0.5 mg by mouth daily.   fosfomycin 3 g Pack Commonly known as: MONUROL Take 3 g by mouth every 3 (three) days for 3 days. One Monday and one thursday Start taking on: January 11, 2019   mirabegron ER 50 MG Tb24 tablet Commonly  known as: MYRBETRIQ Take 1 tablet (50 mg total) by mouth daily.   Omeprazole 20 MG Tbec Take by mouth.   polyethylene glycol 17 g packet Commonly known as: MIRALAX / GLYCOLAX Take 17 g by mouth daily.   tamsulosin 0.4 MG Caps capsule Commonly known as: FLOMAX Take 2 capsules (0.8 mg total) by mouth 2 (two) times daily. What changed: how much to take   traZODone 100 MG tablet Commonly known as: DESYREL Take 200 mg by mouth at bedtime.   venlafaxine XR 75 MG 24 hr capsule Commonly known as: EFFEXOR-XR Take 75 mg by mouth at bedtime.   vitamin B-12 1000 MCG tablet Commonly known as: CYANOCOBALAMIN Take 1,000 mcg by mouth at bedtime.         Today   CHIEF COMPLAINT:  Patient is doing well this morning.  Wife at bedside.   VITAL SIGNS:  Blood pressure (!) 178/89, pulse 61, temperature 98.5 F (36.9 C), temperature source Oral, resp. rate 17, height 5\' 9"  (1.753 m), weight 76.2 kg, SpO2 96 %.   REVIEW OF SYSTEMS:  Review of Systems  Constitutional: Negative.  Negative for chills, fever and malaise/fatigue.  HENT: Negative.  Negative for ear discharge, ear pain, hearing loss, nosebleeds and sore throat.   Eyes: Negative.  Negative for blurred vision and pain.  Respiratory: Negative.  Negative for cough, hemoptysis, shortness of breath and wheezing.   Cardiovascular: Negative.  Negative for chest pain, palpitations and leg swelling.  Gastrointestinal: Negative.  Negative for abdominal pain, blood in stool, diarrhea, nausea and vomiting.  Genitourinary: Negative.  Negative for dysuria.  Musculoskeletal: Negative.  Negative for back pain.  Skin: Negative.   Neurological: Negative for dizziness, tremors, speech change, focal weakness, seizures and headaches.  Endo/Heme/Allergies: Negative.  Does not bruise/bleed easily.  Psychiatric/Behavioral: Positive for memory loss. Negative for depression, hallucinations and suicidal ideas.     PHYSICAL EXAMINATION:  GENERAL:   78 y.o.-year-old patient lying in the bed with no acute distress.  NECK:  Supple, no jugular venous distention. No thyroid enlargement, no tenderness.  LUNGS: Normal breath sounds bilaterally, no wheezing, rales,rhonchi  No use of accessory muscles of respiration.  CARDIOVASCULAR: S1, S2 normal. No murmurs, rubs, or gallops.  ABDOMEN: Soft, non-tender, non-distended. Bowel sounds present. No organomegaly or mass.  EXTREMITIES: No pedal edema, cyanosis, or clubbing.  PSYCHIATRIC: The patient is alert and oriented x 3.  SKIN: No obvious rash, lesion, or ulcer.   DATA REVIEW:   CBC Recent Labs  Lab 01/06/19 0506  WBC 10.3  HGB 13.7  HCT 40.0  PLT 144*    Chemistries  Recent Labs  Lab 01/05/19 1558 01/06/19 0506  NA 131* 133*  K 3.7 3.7  CL 101 103  CO2 20* 23  GLUCOSE 105* 94  BUN 14 17  CREATININE 1.13 1.23  CALCIUM 8.5* 8.2*  AST 14*  --   ALT 5  --   ALKPHOS 61  --  BILITOT 1.2  --     Cardiac Enzymes No results for input(s): TROPONINI in the last 168 hours.  Microbiology Results  @MICRORSLT48 @  RADIOLOGY:  No results found.    Allergies as of 01/08/2019      Reactions   Quinapril Cough   Ambien [zolpidem Tartrate] Other (See Comments)   Unspecified reaction   Fenofibrate Other (See Comments)   Unknown   Zolpidem    Other reaction(s): Other (See Comments) Unspecified reaction   Amoxicillin-pot Clavulanate Nausea And Vomiting   Morphine Nausea Only      Medication List    STOP taking these medications   azithromycin 500 MG tablet Commonly known as: ZITHROMAX     TAKE these medications   aspirin EC 81 MG tablet Take 81 mg by mouth daily.   BENADRYL ALLERGY PO Take by mouth.   carbidopa-levodopa 25-100 MG tablet Commonly known as: SINEMET IR Take 2 tablets by mouth 3 (three) times daily.   diltiazem 240 MG 24 hr capsule Commonly known as: CARDIZEM CD TAKE 1 CAPSULE (240 MG TOTAL) BY MOUTH EVERY MORNING.   docusate sodium 100 MG  capsule Commonly known as: COLACE Take 100 mg by mouth at bedtime.   donepezil 10 MG tablet Commonly known as: ARICEPT TAKE 1 TABLET BY MOUTH EVERY DAY AT NIGHT   doxazosin 4 MG tablet Commonly known as: CARDURA Take 4 mg by mouth daily.   dutasteride 0.5 MG capsule Commonly known as: AVODART Take 0.5 mg by mouth daily.   fosfomycin 3 g Pack Commonly known as: MONUROL Take 3 g by mouth every 3 (three) days for 3 days. One Monday and one thursday Start taking on: January 11, 2019   mirabegron ER 50 MG Tb24 tablet Commonly known as: MYRBETRIQ Take 1 tablet (50 mg total) by mouth daily.   Omeprazole 20 MG Tbec Take by mouth.   polyethylene glycol 17 g packet Commonly known as: MIRALAX / GLYCOLAX Take 17 g by mouth daily.   tamsulosin 0.4 MG Caps capsule Commonly known as: FLOMAX Take 2 capsules (0.8 mg total) by mouth 2 (two) times daily. What changed: how much to take   traZODone 100 MG tablet Commonly known as: DESYREL Take 200 mg by mouth at bedtime.   venlafaxine XR 75 MG 24 hr capsule Commonly known as: EFFEXOR-XR Take 75 mg by mouth at bedtime.   vitamin B-12 1000 MCG tablet Commonly known as: CYANOCOBALAMIN Take 1,000 mcg by mouth at bedtime.         Management plans discussed with the patient and wife and they are in agreement. Stable for [facility] with [facility]  Patient should follow up with pcp  CODE STATUS:     Code Status Orders  (From admission, onward)         Start     Ordered   01/06/19 1107  Do not attempt resuscitation (DNR)  Continuous    Question Answer Comment  In the event of cardiac or respiratory ARREST Do not call a "code blue"   In the event of cardiac or respiratory ARREST Do not perform Intubation, CPR, defibrillation or ACLS   In the event of cardiac or respiratory ARREST Use medication by any route, position, wound care, and other measures to relive pain and suffering. May use oxygen, suction and manual treatment of airway  obstruction as needed for comfort.      01/06/19 1106        Code Status History    Date  Active Date Inactive Code Status Order ID Comments User Context   01/05/2019 2304 01/06/2019 1106 Full Code [id number]  Mayer Camel, NP ED   Advance Care Planning Activity    Advance Directive Documentation     Most Recent Value  Type of Advance Directive  Healthcare Power of Attorney, Living will  Pre-existing out of facility DNR order (yellow form or pink MOST form)  -  "MOST" Form in Place?  -      TOTAL TIME TAKING CARE OF THIS PATIENT: 38 minutes.    Note: This dictation was prepared with Dragon dictation along with smaller phrase technology. Any transcriptional errors that result from this process are unintentional.  Bettey Costa M.D on 01/08/2019 at 9:51 AM  Between 7am to 6pm - Pager - [phone] After 6pm go to [email or web address] - password [email or web address] [facility]  Office  [phone]  CC: Primary care physician; [place] Pink, MD

## 2019-01-08 NOTE — Patient Outreach (Signed)
[place] [facility]) Care Management  01/08/2019  Russell Terry 12/27/1940 [id number]   Received [facility] RNCM referral on 01/07/2019.  Referral source: Doran Clay.   Referral reason: Recent discharge on 01/07/2019, UTI, has dementia, and parkinson.   Per chart review patient remains inpatient and did not discharge on 01/07/2019.  [facility] Telephonic RNCM has referred patient to [facility] Liaison [name]) for discharge disposition/  planning and referral follow up.    Xinyi Batton H. Annia Friendly, BSN, [place] Management [facility] Telephonic CM Phone: [phone] Fax: [phone]

## 2019-01-08 NOTE — Patient Outreach (Addendum)
[facility]) Care Management  01/08/2019  Russell Terry [birth year] [id number]   Received in basket update from [facility] Liaison [name]), states patient will be followed by [facility] going forward, and case can be closed.  Case closure due to patient enrolled in external CM program.  Case update sent via internal email to Russell Terry at [facility].     [name], BSN, [place] Management [facility] Telephonic CM Phone: [phone] Fax: [phone]

## 2019-01-08 NOTE — Care Management Important Message (Signed)
Important Message  Patient Details  Name: Russell Terry MRN: [id number] Date of Birth: [birth year]   Medicare Important Message Given:  Yes     Juliann Pulse A Ella Guillotte 01/08/2019, 11:17 AM

## 2019-01-08 NOTE — TOC Transition Note (Signed)
Transition of Care [facility]) - CM/SW Discharge Note   Patient Details  Name: Russell Terry MRN: [id number] Date of Birth: [birth year]  Transition of Care [facility]) CM/SW Contact:  Latanya Maudlin, RN Phone Number: 01/08/2019, 10:24 AM   Clinical Narrative: Patient to be discharged per MD order. Orders in place for home health services. Previous RNCM had began workup for home health via [facility]. Notified Corene Cornea from [facility] of pending discharge. No DME needs. Spouse to transport.       Final next level of care: [place] Barriers to Discharge: No Barriers Identified   Patient Goals and CMS Choice Patient states their goals for this hospitalization and ongoing recovery are:: Hope to go home today but understands if culture sensitivies don't come back it may be tomorrow. CMS Medicare.gov Compare Post Acute Care list provided to:: Patient Choice offered to / list presented to : Patient, Spouse  Discharge Placement                       Discharge Plan and Services   Discharge Planning Services: CM Consult Post Acute Care Choice: Home Health                    HH Arranged: RN, PT, Nurse's Aide, Social Work [employer] Agency: [place] ([facility]) Date [place]: 01/08/19 Time [place]: 1024 Representative spoke with at [place]: [place] ([place]) Interventions     Readmission Risk Interventions Readmission Risk Prevention Plan 01/08/2019  Post Dischage Appt Complete  Medication Screening Complete  Transportation Screening Complete  Some recent data might be hidden

## 2019-01-11 DIAGNOSIS — Z9181 History of falling: Secondary | ICD-10-CM | POA: Diagnosis not present

## 2019-01-11 DIAGNOSIS — E785 Hyperlipidemia, unspecified: Secondary | ICD-10-CM | POA: Diagnosis not present

## 2019-01-11 DIAGNOSIS — I129 Hypertensive chronic kidney disease with stage 1 through stage 4 chronic kidney disease, or unspecified chronic kidney disease: Secondary | ICD-10-CM | POA: Diagnosis not present

## 2019-01-11 DIAGNOSIS — I4891 Unspecified atrial fibrillation: Secondary | ICD-10-CM | POA: Diagnosis not present

## 2019-01-11 DIAGNOSIS — G20 Parkinson's disease: Secondary | ICD-10-CM | POA: Diagnosis not present

## 2019-01-11 DIAGNOSIS — Z87442 Personal history of urinary calculi: Secondary | ICD-10-CM | POA: Diagnosis not present

## 2019-01-11 DIAGNOSIS — N189 Chronic kidney disease, unspecified: Secondary | ICD-10-CM | POA: Diagnosis not present

## 2019-01-11 DIAGNOSIS — B965 Pseudomonas (aeruginosa) (mallei) (pseudomallei) as the cause of diseases classified elsewhere: Secondary | ICD-10-CM | POA: Diagnosis not present

## 2019-01-11 DIAGNOSIS — N401 Enlarged prostate with lower urinary tract symptoms: Secondary | ICD-10-CM | POA: Diagnosis not present

## 2019-01-11 DIAGNOSIS — J449 Chronic obstructive pulmonary disease, unspecified: Secondary | ICD-10-CM | POA: Diagnosis not present

## 2019-01-11 DIAGNOSIS — Z7982 Long term (current) use of aspirin: Secondary | ICD-10-CM | POA: Diagnosis not present

## 2019-01-11 DIAGNOSIS — F0280 Dementia in other diseases classified elsewhere without behavioral disturbance: Secondary | ICD-10-CM | POA: Diagnosis not present

## 2019-01-11 DIAGNOSIS — Z905 Acquired absence of kidney: Secondary | ICD-10-CM | POA: Diagnosis not present

## 2019-01-11 DIAGNOSIS — Z85528 Personal history of other malignant neoplasm of kidney: Secondary | ICD-10-CM | POA: Diagnosis not present

## 2019-01-11 DIAGNOSIS — F17210 Nicotine dependence, cigarettes, uncomplicated: Secondary | ICD-10-CM | POA: Diagnosis not present

## 2019-01-11 DIAGNOSIS — D329 Benign neoplasm of meninges, unspecified: Secondary | ICD-10-CM | POA: Diagnosis not present

## 2019-01-11 DIAGNOSIS — N3090 Cystitis, unspecified without hematuria: Secondary | ICD-10-CM | POA: Diagnosis not present

## 2019-01-11 DIAGNOSIS — B9689 Other specified bacterial agents as the cause of diseases classified elsewhere: Secondary | ICD-10-CM | POA: Diagnosis not present

## 2019-01-13 DIAGNOSIS — F0390 Unspecified dementia without behavioral disturbance: Secondary | ICD-10-CM | POA: Diagnosis not present

## 2019-01-13 DIAGNOSIS — N390 Urinary tract infection, site not specified: Secondary | ICD-10-CM | POA: Diagnosis not present

## 2019-01-13 DIAGNOSIS — R350 Frequency of micturition: Secondary | ICD-10-CM | POA: Diagnosis not present

## 2019-01-13 DIAGNOSIS — N401 Enlarged prostate with lower urinary tract symptoms: Secondary | ICD-10-CM | POA: Diagnosis not present

## 2019-01-13 DIAGNOSIS — Z87440 Personal history of urinary (tract) infections: Secondary | ICD-10-CM | POA: Diagnosis not present

## 2019-01-13 DIAGNOSIS — G20 Parkinson's disease: Secondary | ICD-10-CM | POA: Diagnosis not present

## 2019-01-14 ENCOUNTER — Telehealth: Payer: Self-pay | Admitting: Urology

## 2019-01-14 NOTE — Telephone Encounter (Signed)
Pt. Was admitted to the hospital on 01/05/19 after a visit to the ER for fever,cough and congestion, UTI. Wife states he is almost finished with antibiotic prescribed from ER She was calling to see if she needed to bring the pt. In sooner than his 02/18/19 appointment for a follow up with Dr.Stoioff. Pt. Is not having any symptoms at this time.

## 2019-01-14 NOTE — Telephone Encounter (Signed)
The patient does not need to be seen sooner, however when I look at the pt's appt desk I don't see any appts scheduled. Do you know why the appt for 8/6 was cancelled?

## 2019-01-14 NOTE — Telephone Encounter (Signed)
Does not need to be sooner but I would recommend a urinalysis/possible culture 7-14 days after completing antibiotics

## 2019-01-18 ENCOUNTER — Other Ambulatory Visit: Payer: Self-pay | Admitting: Family Medicine

## 2019-01-18 DIAGNOSIS — R972 Elevated prostate specific antigen [PSA]: Secondary | ICD-10-CM

## 2019-01-18 NOTE — Telephone Encounter (Signed)
I can see the appt. For 08/06 is still scheduled. I will have the pt. Come in for a UA, Ucx.

## 2019-01-19 ENCOUNTER — Other Ambulatory Visit: Payer: Self-pay

## 2019-01-19 ENCOUNTER — Other Ambulatory Visit: Payer: PPO

## 2019-01-19 DIAGNOSIS — R350 Frequency of micturition: Secondary | ICD-10-CM

## 2019-01-19 LAB — URINALYSIS, COMPLETE
Bilirubin, UA: NEGATIVE
Nitrite, UA: POSITIVE — AB
RBC, UA: NEGATIVE
Specific Gravity, UA: 1.02 (ref 1.005–1.030)
Urobilinogen, Ur: 0.2 mg/dL (ref 0.2–1.0)
pH, UA: 5.5 (ref 5.0–7.5)

## 2019-01-19 LAB — MICROSCOPIC EXAMINATION
RBC, Urine: NONE SEEN /hpf (ref 0–2)
WBC, UA: >30 /hpf — AB (ref 0–5)

## 2019-01-22 DIAGNOSIS — H18423 Band keratopathy, bilateral: Secondary | ICD-10-CM | POA: Diagnosis not present

## 2019-01-22 LAB — CULTURE, URINE COMPREHENSIVE

## 2019-01-25 ENCOUNTER — Other Ambulatory Visit: Payer: Self-pay | Admitting: Urology

## 2019-01-25 ENCOUNTER — Telehealth: Payer: Self-pay | Admitting: Family Medicine

## 2019-01-25 MED ORDER — CEFDINIR 300 MG PO CAPS: 300.0000 mg | ORAL_CAPSULE | Freq: Two times a day (BID) | ORAL | 0 refills | Status: AC

## 2019-01-25 NOTE — Telephone Encounter (Signed)
LMOM for patient to return call.

## 2019-01-25 NOTE — Telephone Encounter (Signed)
-----   Message from Abbie Sons, MD sent at 01/25/2019  7:13 AM EDT ----- Urine culture was positive.  Is patient having any symptoms?

## 2019-01-25 NOTE — Telephone Encounter (Signed)
He is having urinary frequency, sluggish for a couple days. Per Stoioff he is to try [facility]. If he gets worse he may need to have IV abx. An appointment has been made for UA in 10 days.

## 2019-01-28 DIAGNOSIS — I10 Essential (primary) hypertension: Secondary | ICD-10-CM | POA: Diagnosis not present

## 2019-01-28 DIAGNOSIS — N390 Urinary tract infection, site not specified: Secondary | ICD-10-CM | POA: Diagnosis not present

## 2019-02-04 ENCOUNTER — Ambulatory Visit: Payer: PPO | Admitting: Urology

## 2019-02-04 DIAGNOSIS — F0280 Dementia in other diseases classified elsewhere without behavioral disturbance: Secondary | ICD-10-CM | POA: Diagnosis not present

## 2019-02-04 DIAGNOSIS — J449 Chronic obstructive pulmonary disease, unspecified: Secondary | ICD-10-CM | POA: Diagnosis not present

## 2019-02-04 DIAGNOSIS — G20 Parkinson's disease: Secondary | ICD-10-CM | POA: Diagnosis not present

## 2019-02-04 DIAGNOSIS — B965 Pseudomonas (aeruginosa) (mallei) (pseudomallei) as the cause of diseases classified elsewhere: Secondary | ICD-10-CM | POA: Diagnosis not present

## 2019-02-04 DIAGNOSIS — Z85528 Personal history of other malignant neoplasm of kidney: Secondary | ICD-10-CM | POA: Diagnosis not present

## 2019-02-04 DIAGNOSIS — Z87442 Personal history of urinary calculi: Secondary | ICD-10-CM | POA: Diagnosis not present

## 2019-02-04 DIAGNOSIS — N401 Enlarged prostate with lower urinary tract symptoms: Secondary | ICD-10-CM | POA: Diagnosis not present

## 2019-02-04 DIAGNOSIS — F17210 Nicotine dependence, cigarettes, uncomplicated: Secondary | ICD-10-CM | POA: Diagnosis not present

## 2019-02-04 DIAGNOSIS — Z7982 Long term (current) use of aspirin: Secondary | ICD-10-CM | POA: Diagnosis not present

## 2019-02-04 DIAGNOSIS — Z9181 History of falling: Secondary | ICD-10-CM | POA: Diagnosis not present

## 2019-02-04 DIAGNOSIS — I4891 Unspecified atrial fibrillation: Secondary | ICD-10-CM | POA: Diagnosis not present

## 2019-02-04 DIAGNOSIS — D329 Benign neoplasm of meninges, unspecified: Secondary | ICD-10-CM | POA: Diagnosis not present

## 2019-02-04 DIAGNOSIS — B9689 Other specified bacterial agents as the cause of diseases classified elsewhere: Secondary | ICD-10-CM | POA: Diagnosis not present

## 2019-02-04 DIAGNOSIS — N3090 Cystitis, unspecified without hematuria: Secondary | ICD-10-CM | POA: Diagnosis not present

## 2019-02-04 DIAGNOSIS — I129 Hypertensive chronic kidney disease with stage 1 through stage 4 chronic kidney disease, or unspecified chronic kidney disease: Secondary | ICD-10-CM | POA: Diagnosis not present

## 2019-02-04 DIAGNOSIS — N189 Chronic kidney disease, unspecified: Secondary | ICD-10-CM | POA: Diagnosis not present

## 2019-02-04 DIAGNOSIS — E785 Hyperlipidemia, unspecified: Secondary | ICD-10-CM | POA: Diagnosis not present

## 2019-02-04 DIAGNOSIS — Z905 Acquired absence of kidney: Secondary | ICD-10-CM | POA: Diagnosis not present

## 2019-02-05 ENCOUNTER — Ambulatory Visit: Payer: PPO | Admitting: *Deleted

## 2019-02-05 ENCOUNTER — Other Ambulatory Visit: Payer: Self-pay

## 2019-02-05 DIAGNOSIS — R350 Frequency of micturition: Secondary | ICD-10-CM

## 2019-02-05 LAB — MICROSCOPIC EXAMINATION
RBC, Urine: NONE SEEN /hpf (ref 0–2)
WBC, UA: >30 /hpf — AB (ref 0–5)

## 2019-02-05 LAB — URINALYSIS, COMPLETE
Bilirubin, UA: NEGATIVE
Glucose, UA: NEGATIVE
Ketones, UA: NEGATIVE
Nitrite, UA: NEGATIVE
Specific Gravity, UA: 1.02 (ref 1.005–1.030)
Urobilinogen, Ur: 0.2 mg/dL (ref 0.2–1.0)
pH, UA: 7 (ref 5.0–7.5)

## 2019-02-05 NOTE — Progress Notes (Addendum)
Patient presented to clinic to give a urine sample. He has been having urinary frequency, urine leakage, some back pain symptoms have been ongoing for two weeks. Denies fevers, chills, nausea, or night sweats. He has not been taking any OTC medications, medications verified. He is aware will call with results.

## 2019-02-07 LAB — URINE CULTURE

## 2019-02-08 ENCOUNTER — Telehealth: Payer: Self-pay

## 2019-02-08 NOTE — Telephone Encounter (Signed)
Called pt informed him of the information below. Pt gave verbal understanding.  

## 2019-02-08 NOTE — Telephone Encounter (Signed)
-----   Message from Abbie Sons, MD sent at 02/07/2019  8:52 PM EDT ----- Follow-up urine culture was negative

## 2019-02-18 ENCOUNTER — Ambulatory Visit (INDEPENDENT_AMBULATORY_CARE_PROVIDER_SITE_OTHER): Payer: PPO | Admitting: Urology

## 2019-02-18 ENCOUNTER — Other Ambulatory Visit: Payer: Self-pay

## 2019-02-18 ENCOUNTER — Encounter: Payer: Self-pay | Admitting: Urology

## 2019-02-18 VITALS — BP 101/66 | HR 76 | Ht 69.0 in | Wt 169.0 lb

## 2019-02-18 DIAGNOSIS — R3915 Urgency of urination: Secondary | ICD-10-CM | POA: Diagnosis not present

## 2019-02-18 DIAGNOSIS — N200 Calculus of kidney: Secondary | ICD-10-CM

## 2019-02-18 DIAGNOSIS — Z85528 Personal history of other malignant neoplasm of kidney: Secondary | ICD-10-CM | POA: Diagnosis not present

## 2019-02-18 DIAGNOSIS — N401 Enlarged prostate with lower urinary tract symptoms: Secondary | ICD-10-CM | POA: Diagnosis not present

## 2019-02-18 DIAGNOSIS — Z87898 Personal history of other specified conditions: Secondary | ICD-10-CM | POA: Diagnosis not present

## 2019-02-18 DIAGNOSIS — R350 Frequency of micturition: Secondary | ICD-10-CM

## 2019-02-18 DIAGNOSIS — R300 Dysuria: Secondary | ICD-10-CM | POA: Diagnosis not present

## 2019-02-18 LAB — MICROSCOPIC EXAMINATION: RBC, Urine: NONE SEEN /hpf (ref 0–2)

## 2019-02-18 LAB — URINALYSIS, COMPLETE
Bilirubin, UA: NEGATIVE
Glucose, UA: NEGATIVE
Nitrite, UA: POSITIVE — AB
RBC, UA: NEGATIVE
Specific Gravity, UA: >1.03 — ABNORMAL HIGH (ref 1.005–1.030)
Urobilinogen, Ur: 1 mg/dL (ref 0.2–1.0)
pH, UA: 5 (ref 5.0–7.5)

## 2019-02-18 NOTE — Progress Notes (Signed)
02/18/2019 1:15 PM   Russell Terry [birth year] [id number]  Referring provider: Maryland Pink, MD [address] [facility] [place],  [place] [address]  Chief Complaint  Patient presents with  . Urinary Incontinence    Urologic problem list: -BPH with lower urinary tract symptoms; on dutasteride, doxazosin and tamsulosin; cystoscopy with trilobar enlargement/intravesical median lobe  -History elevated PSA with benign prostate biopsy  -Recurrent nephrolithiasis; lower pole calculi  - T1b renal cell carcinoma status post right laparoscopic nephrectomy [facility] 9864  HPI: 78 year old male presents for annual follow-up.  Since his last visit he was hospitalized from 6/23-6/26 with sepsis suspected from a urinary source.  His urine culture grew Pseudomonas.  He had a follow-up culture here in mid July which again grew Pseudomonas.  He was having some symptoms of increased fatigue and sluggishness and was treated with an additional 10-day course.  A repeat urine culture performed on 7/24 grew mixed flora.  His most bothersome complaint is urinary frequency, urgency with urge incontinence.  He has nocturia x2-3.  He remains on alpha blockers and dutasteride.  He is not able to afford Myrbetriq and has discontinued this medication.  Denies gross hematuria.  He has mild dysuria.  Denies fever or chills.  Renal ultrasound performed June 2019 showed nonobstructing renal calculi and a PVR 35 mL.  PMH: Past Medical History:  Diagnosis Date  . Atrial fibrillation ([place]) 12/11/2013  . BPH (benign prostatic hyperplasia)   . Bradycardia 12/11/2013  . Calculus of kidney 12/06/2013  . Cancer ([place])    rt kidney removed  . Chronic kidney disease    stones,rt kidney removed  . COPD (chronic obstructive pulmonary disease) ([place]) 12/11/2013  . DDD (degenerative disc disease), lumbar 12/13/2013  . Delirium   . Dementia ([place])   . Elevated prostate specific antigen (PSA) 03/03/2013  .  Encephalopathy acute 07/22/2016  . Enlarged prostate with lower urinary tract symptoms (LUTS) 03/03/2013  . Essential hypertension 07/22/2016  . Hyperlipidemia, unspecified 07/09/2017  . Hypertension   . Meningioma ([place]) 10/08/2016  . Parkinsonian features 10/08/2016  . Pleuritic chest pain 05/31/2014  . Renal cell carcinoma ([place]) 05/04/2016  . Renal cyst, acquired, left 04/22/2015    Surgical History: Past Surgical History:  Procedure Laterality Date  . BRAIN SURGERY  2017  . COLON SURGERY    . COLONOSCOPY WITH PROPOFOL N/A 12/31/2016   Procedure: COLONOSCOPY WITH PROPOFOL;  Surgeon: Lollie Sails, MD;  Location[facility] ENDOSCOPY;  Service: Endoscopy;  Laterality: N/A;  . [place]    . LUMBAR LAMINECTOMY/DECOMPRESSION MICRODISCECTOMY N/A 10/18/2015   Procedure: LUMBAR LAMINECTOMY/DECOMPRESSION MICRODISCECTOMY 3 LEVELS;  Surgeon: Newman Pies, MD;  Location: [place] NEURO ORS;  Service: Neurosurgery;  Laterality: N/A;  L23 L34 L45 laminectomy and foraminotomy  . NEPHRECTOMY     2012  . URETEROSCOPY WITH HOLMIUM LASER LITHOTRIPSY      Home Medications:  Allergies as of 02/18/2019      Reactions   Quinapril Cough   Ambien [zolpidem Tartrate] Other (See Comments)   Unspecified reaction   Fenofibrate Other (See Comments)   Unknown   Zolpidem    Other reaction(s): Other (See Comments) Unspecified reaction   Amoxicillin-pot Clavulanate Nausea And Vomiting   Morphine Nausea Only      Medication List       Accurate as of February 18, 2019  1:15 PM. If you have any questions, ask your nurse or doctor.        STOP  taking these medications   mirabegron ER 50 MG Tb24 tablet Commonly known as: MYRBETRIQ Stopped by: Abbie Sons, MD     TAKE these medications   albuterol 108 (90 Base) MCG/ACT inhaler Commonly known as: VENTOLIN HFA INHALE 2 PUFFS BY MOUTH EVERY 6 HOURS AS NEEDED FOR WHEEZE   aspirin EC 81 MG tablet Take 81 mg by mouth daily.   BENADRYL ALLERGY  PO Take by mouth.   carbidopa-levodopa 25-100 MG tablet Commonly known as: SINEMET IR Take 2 tablets by mouth 3 (three) times daily.   diltiazem 240 MG 24 hr capsule Commonly known as: CARDIZEM CD TAKE 1 CAPSULE (240 MG TOTAL) BY MOUTH EVERY MORNING.   docusate sodium 100 MG capsule Commonly known as: COLACE Take 100 mg by mouth at bedtime.   donepezil 10 MG tablet Commonly known as: ARICEPT TAKE 1 TABLET BY MOUTH EVERY DAY AT NIGHT   doxazosin 4 MG tablet Commonly known as: CARDURA Take 4 mg by mouth daily.   dutasteride 0.5 MG capsule Commonly known as: AVODART Take 0.5 mg by mouth daily.   Omeprazole 20 MG Tbec Take by mouth.   polyethylene glycol 17 g packet Commonly known as: MIRALAX / GLYCOLAX Take 17 g by mouth daily.   tamsulosin 0.4 MG Caps capsule Commonly known as: FLOMAX Take 2 capsules (0.8 mg total) by mouth 2 (two) times daily. What changed: how much to take   traZODone 100 MG tablet Commonly known as: DESYREL Take 200 mg by mouth at bedtime.   venlafaxine XR 75 MG 24 hr capsule Commonly known as: EFFEXOR-XR Take 75 mg by mouth at bedtime.   vitamin B-12 1000 MCG tablet Commonly known as: CYANOCOBALAMIN Take 1,000 mcg by mouth at bedtime.       Allergies:  Allergies  Allergen Reactions  . Quinapril Cough  . Ambien [Zolpidem Tartrate] Other (See Comments)    Unspecified reaction  . Fenofibrate Other (See Comments)    Unknown  . Zolpidem     Other reaction(s): Other (See Comments) Unspecified reaction  . Amoxicillin-Pot Clavulanate Nausea And Vomiting  . Morphine Nausea Only    Family History: Family History  Problem Relation Age of Onset  . Bladder Cancer Neg Hx   . Kidney cancer Neg Hx   . Prolactinoma Neg Hx   . Prostate cancer Neg Hx     Social History:  reports that he has been smoking. He has a 50.00 pack-year smoking history. He has never used smokeless tobacco. He reports that he does not drink alcohol or use drugs.   ROS: UROLOGY Frequent Urination?: Yes Hard to postpone urination?: Yes Burning/pain with urination?: Yes Get up at night to urinate?: Yes Leakage of urine?: Yes Urine stream starts and stops?: Yes Trouble starting stream?: No Do you have to strain to urinate?: No Blood in urine?: No Urinary tract infection?: No Sexually transmitted disease?: No Injury to kidneys or bladder?: No Painful intercourse?: No Weak stream?: No Erection problems?: No Penile pain?: No  Gastrointestinal Nausea?: No Vomiting?: No Indigestion/heartburn?: No Diarrhea?: No Constipation?: No  Constitutional Fever: No Night sweats?: No Weight loss?: No Fatigue?: No  Skin Skin rash/lesions?: No Itching?: No  Eyes Blurred vision?: No Double vision?: No  Ears/Nose/Throat Sore throat?: No Sinus problems?: No  Hematologic/Lymphatic Swollen glands?: No Easy bruising?: No  Cardiovascular Leg swelling?: No Chest pain?: No  Respiratory Cough?: No Shortness of breath?: No  Endocrine Excessive thirst?: No  Musculoskeletal Back pain?: Yes Joint pain?: No  Neurological Headaches?: No Dizziness?: No  Psychologic Depression?: No Anxiety?: No  Physical Exam: BP 101/66   Pulse 76   Ht 5\' 9"  (1.753 m)   Wt 169 lb (76.7 kg)   BMI 24.96 kg/m   Constitutional:  Alert, No acute distress. HEENT: [place] AT, moist mucus membranes.  Trachea midline, no masses. Cardiovascular: No clubbing, cyanosis, or edema. Respiratory: Normal respiratory effort, no increased work of breathing. Skin: No rashes, bruises or suspicious lesions. Neurologic: Grossly intact, no focal deficits, moving all 4 extremities. Psychiatric: Normal mood and affect.   Assessment & Plan:    - BPH with lower urinary tract symptoms He will continue medical management.  He does have Parkinson's and his storage related voiding symptoms may be secondary or significant contributing to his voiding symptoms.  Based on his age,  history urinary retention and frailty I have not recommended anticholinergic therapy.  Do not feel he would be a candidate for Botox or surgical management.  I did discuss PTNS and they were given a brochure.  - Solitary kidney with nephrolithiasis He has a follow-up appointment with his PCP later this month and will have a serum creatinine drawn then.   Return in about 6 months (around 08/21/2019) for Recheck, Bladder scan.  Abbie Sons, [place] [address] [place], [place] [address] [phone]

## 2019-02-20 DIAGNOSIS — R3915 Urgency of urination: Secondary | ICD-10-CM | POA: Insufficient documentation

## 2019-02-20 DIAGNOSIS — Z85528 Personal history of other malignant neoplasm of kidney: Secondary | ICD-10-CM | POA: Insufficient documentation

## 2019-02-20 DIAGNOSIS — Z87898 Personal history of other specified conditions: Secondary | ICD-10-CM | POA: Insufficient documentation

## 2019-02-20 DIAGNOSIS — R350 Frequency of micturition: Secondary | ICD-10-CM | POA: Insufficient documentation

## 2019-02-22 ENCOUNTER — Telehealth: Payer: Self-pay | Admitting: Urology

## 2019-02-22 ENCOUNTER — Other Ambulatory Visit: Payer: Self-pay | Admitting: Family Medicine

## 2019-02-22 ENCOUNTER — Other Ambulatory Visit: Payer: Self-pay

## 2019-02-22 ENCOUNTER — Other Ambulatory Visit: Payer: PPO

## 2019-02-22 ENCOUNTER — Telehealth: Payer: Self-pay

## 2019-02-22 DIAGNOSIS — R300 Dysuria: Secondary | ICD-10-CM | POA: Diagnosis not present

## 2019-02-22 LAB — URINALYSIS, COMPLETE
Bilirubin, UA: NEGATIVE
Nitrite, UA: POSITIVE — AB
RBC, UA: NEGATIVE
Specific Gravity, UA: 1.025 (ref 1.005–1.030)
Urobilinogen, Ur: 1 mg/dL (ref 0.2–1.0)
pH, UA: 5.5 (ref 5.0–7.5)

## 2019-02-22 LAB — MICROSCOPIC EXAMINATION

## 2019-02-22 NOTE — Telephone Encounter (Signed)
-----   Message from Abbie Sons, MD sent at 02/20/2019  1:24 PM EDT ----- Urinalysis was nitrite positive.  If having symptoms would repeat urine culture

## 2019-02-22 NOTE — Telephone Encounter (Signed)
Pt.'s wife states pt. Is complaining of burning,left side pain,blood in urine. Pt. Told to come in today for UCX.

## 2019-02-22 NOTE — Telephone Encounter (Signed)
Called pt no answer, LM for pt to call office back to inform us if he is symptomatic.

## 2019-02-23 NOTE — Telephone Encounter (Signed)
Pt is symptomatic he is having left sided pain and visible urine per Denny Peon please advise

## 2019-02-23 NOTE — Telephone Encounter (Signed)
It looks like he had a urine culture yesterday.  His recent urine cultures have been multidrug resistance and would wait on culture before starting antibiotics.  If symptoms worsen would recommend ED visit

## 2019-02-24 LAB — CULTURE, URINE COMPREHENSIVE

## 2019-02-25 DIAGNOSIS — G301 Alzheimer's disease with late onset: Secondary | ICD-10-CM | POA: Diagnosis not present

## 2019-02-25 DIAGNOSIS — I10 Essential (primary) hypertension: Secondary | ICD-10-CM | POA: Diagnosis not present

## 2019-02-25 DIAGNOSIS — F0280 Dementia in other diseases classified elsewhere without behavioral disturbance: Secondary | ICD-10-CM | POA: Diagnosis not present

## 2019-02-25 DIAGNOSIS — N390 Urinary tract infection, site not specified: Secondary | ICD-10-CM | POA: Diagnosis not present

## 2019-02-25 DIAGNOSIS — Z0000 Encounter for general adult medical examination without abnormal findings: Secondary | ICD-10-CM | POA: Diagnosis not present

## 2019-02-25 DIAGNOSIS — Z23 Encounter for immunization: Secondary | ICD-10-CM | POA: Diagnosis not present

## 2019-02-25 DIAGNOSIS — J449 Chronic obstructive pulmonary disease, unspecified: Secondary | ICD-10-CM | POA: Diagnosis not present

## 2019-02-26 ENCOUNTER — Telehealth: Payer: Self-pay | Admitting: Family Medicine

## 2019-02-26 ENCOUNTER — Telehealth: Payer: Self-pay | Admitting: Urology

## 2019-02-26 NOTE — Telephone Encounter (Signed)
Patient's wife notified and voiced understanding.

## 2019-02-26 NOTE — Telephone Encounter (Signed)
-----   Message from Abbie Sons, MD sent at 02/26/2019 12:54 PM EDT ----- Urine culture grew mixed flora-no predominant bacteria

## 2019-02-26 NOTE — Telephone Encounter (Signed)
Pt's wife called back and states that she keeps getting sent text from [place] but she does not understand the results. She would like a call back ASAP.

## 2019-02-26 NOTE — Telephone Encounter (Signed)
Patient's wife notified UCX was normal

## 2019-02-26 NOTE — Telephone Encounter (Signed)
Pt's wife called and wanted pt's lab results.

## 2019-03-24 ENCOUNTER — Other Ambulatory Visit: Payer: Self-pay

## 2019-03-24 DIAGNOSIS — N401 Enlarged prostate with lower urinary tract symptoms: Secondary | ICD-10-CM

## 2019-03-24 MED ORDER — TAMSULOSIN HCL 0.4 MG PO CAPS: 0.8000 mg | ORAL_CAPSULE | Freq: Two times a day (BID) | ORAL | 6 refills | Status: DC

## 2019-03-30 ENCOUNTER — Other Ambulatory Visit: Payer: Self-pay

## 2019-03-30 ENCOUNTER — Ambulatory Visit
Admission: RE | Admit: 2019-03-30 | Discharge: 2019-03-30 | Disposition: A | Discharge status: Home / Self Care | Payer: PPO | Source: Ambulatory Visit | Attending: Sports Medicine | Admitting: Sports Medicine

## 2019-03-30 DIAGNOSIS — G8929 Other chronic pain: Secondary | ICD-10-CM | POA: Diagnosis not present

## 2019-03-30 DIAGNOSIS — M47816 Spondylosis without myelopathy or radiculopathy, lumbar region: Secondary | ICD-10-CM | POA: Diagnosis not present

## 2019-03-30 DIAGNOSIS — M545 Low back pain: Secondary | ICD-10-CM | POA: Diagnosis not present

## 2019-03-30 DIAGNOSIS — M5136 Other intervertebral disc degeneration, lumbar region: Secondary | ICD-10-CM

## 2019-03-30 DIAGNOSIS — M5442 Lumbago with sciatica, left side: Secondary | ICD-10-CM | POA: Diagnosis not present

## 2019-04-01 ENCOUNTER — Ambulatory Visit: Payer: PPO | Admitting: Physical Therapy

## 2019-04-01 ENCOUNTER — Encounter: Payer: PPO | Admitting: Speech Pathology

## 2019-04-05 ENCOUNTER — Ambulatory Visit: Payer: PPO | Admitting: Physical Therapy

## 2019-04-05 ENCOUNTER — Encounter: Payer: PPO | Admitting: Speech Pathology

## 2019-04-06 ENCOUNTER — Ambulatory Visit: Payer: PPO | Admitting: Physical Therapy

## 2019-04-06 ENCOUNTER — Encounter: Payer: PPO | Admitting: Speech Pathology

## 2019-04-07 ENCOUNTER — Encounter: Payer: PPO | Admitting: Speech Pathology

## 2019-04-07 ENCOUNTER — Ambulatory Visit: Payer: PPO | Admitting: Physical Therapy

## 2019-04-08 ENCOUNTER — Encounter: Payer: PPO | Admitting: Speech Pathology

## 2019-04-08 ENCOUNTER — Ambulatory Visit: Payer: PPO | Admitting: Physical Therapy

## 2019-04-12 ENCOUNTER — Ambulatory Visit: Payer: PPO | Admitting: Physical Therapy

## 2019-04-12 ENCOUNTER — Encounter: Payer: PPO | Admitting: Speech Pathology

## 2019-04-15 DIAGNOSIS — R8290 Unspecified abnormal findings in urine: Secondary | ICD-10-CM | POA: Diagnosis not present

## 2019-04-15 DIAGNOSIS — M545 Low back pain: Secondary | ICD-10-CM | POA: Diagnosis not present

## 2019-04-15 DIAGNOSIS — N390 Urinary tract infection, site not specified: Secondary | ICD-10-CM | POA: Diagnosis not present

## 2019-04-15 DIAGNOSIS — I10 Essential (primary) hypertension: Secondary | ICD-10-CM | POA: Diagnosis not present

## 2019-04-16 DIAGNOSIS — R8290 Unspecified abnormal findings in urine: Secondary | ICD-10-CM | POA: Diagnosis not present

## 2019-04-19 ENCOUNTER — Other Ambulatory Visit: Payer: Self-pay | Admitting: Physician Assistant

## 2019-04-19 DIAGNOSIS — G20 Parkinson's disease: Secondary | ICD-10-CM | POA: Diagnosis not present

## 2019-04-19 DIAGNOSIS — F0280 Dementia in other diseases classified elsewhere without behavioral disturbance: Secondary | ICD-10-CM | POA: Diagnosis not present

## 2019-04-19 DIAGNOSIS — G4700 Insomnia, unspecified: Secondary | ICD-10-CM | POA: Diagnosis not present

## 2019-04-19 DIAGNOSIS — G308 Other Alzheimer's disease: Secondary | ICD-10-CM | POA: Diagnosis not present

## 2019-04-19 DIAGNOSIS — R109 Unspecified abdominal pain: Secondary | ICD-10-CM

## 2019-04-20 ENCOUNTER — Ambulatory Visit: Payer: PPO | Admitting: Physician Assistant

## 2019-04-21 ENCOUNTER — Telehealth: Payer: Self-pay | Admitting: Physician Assistant

## 2019-04-21 ENCOUNTER — Emergency Department: Payer: PPO

## 2019-04-21 ENCOUNTER — Other Ambulatory Visit: Payer: Self-pay

## 2019-04-21 ENCOUNTER — Inpatient Hospital Stay: Payer: PPO

## 2019-04-21 ENCOUNTER — Encounter: Payer: Self-pay | Admitting: Emergency Medicine

## 2019-04-21 ENCOUNTER — Ambulatory Visit: Payer: PPO | Admitting: Physician Assistant

## 2019-04-21 ENCOUNTER — Inpatient Hospital Stay
Admission: EM | Admit: 2019-04-21 | Discharge: 2019-04-25 | DRG: 696 | Disposition: A | Discharge status: Home Health | Payer: PPO | Attending: Internal Medicine | Admitting: Internal Medicine

## 2019-04-21 DIAGNOSIS — N390 Urinary tract infection, site not specified: Secondary | ICD-10-CM | POA: Diagnosis not present

## 2019-04-21 DIAGNOSIS — G20 Parkinson's disease: Secondary | ICD-10-CM | POA: Diagnosis not present

## 2019-04-21 DIAGNOSIS — I482 Chronic atrial fibrillation, unspecified: Secondary | ICD-10-CM | POA: Diagnosis not present

## 2019-04-21 DIAGNOSIS — Z20828 Contact with and (suspected) exposure to other viral communicable diseases: Secondary | ICD-10-CM | POA: Diagnosis not present

## 2019-04-21 DIAGNOSIS — N200 Calculus of kidney: Secondary | ICD-10-CM | POA: Diagnosis not present

## 2019-04-21 DIAGNOSIS — I129 Hypertensive chronic kidney disease with stage 1 through stage 4 chronic kidney disease, or unspecified chronic kidney disease: Secondary | ICD-10-CM | POA: Diagnosis not present

## 2019-04-21 DIAGNOSIS — N183 Chronic kidney disease, stage 3 unspecified: Secondary | ICD-10-CM | POA: Diagnosis present

## 2019-04-21 DIAGNOSIS — Z905 Acquired absence of kidney: Secondary | ICD-10-CM

## 2019-04-21 DIAGNOSIS — Z85528 Personal history of other malignant neoplasm of kidney: Secondary | ICD-10-CM | POA: Diagnosis not present

## 2019-04-21 DIAGNOSIS — R4182 Altered mental status, unspecified: Secondary | ICD-10-CM | POA: Diagnosis not present

## 2019-04-21 DIAGNOSIS — Z881 Allergy status to other antibiotic agents status: Secondary | ICD-10-CM | POA: Diagnosis not present

## 2019-04-21 DIAGNOSIS — R1030 Lower abdominal pain, unspecified: Secondary | ICD-10-CM | POA: Diagnosis not present

## 2019-04-21 DIAGNOSIS — N401 Enlarged prostate with lower urinary tract symptoms: Secondary | ICD-10-CM | POA: Diagnosis not present

## 2019-04-21 DIAGNOSIS — Z86011 Personal history of benign neoplasm of the brain: Secondary | ICD-10-CM

## 2019-04-21 DIAGNOSIS — R310 Gross hematuria: Secondary | ICD-10-CM | POA: Diagnosis not present

## 2019-04-21 DIAGNOSIS — R339 Retention of urine, unspecified: Secondary | ICD-10-CM | POA: Diagnosis not present

## 2019-04-21 DIAGNOSIS — Z7982 Long term (current) use of aspirin: Secondary | ICD-10-CM

## 2019-04-21 DIAGNOSIS — R451 Restlessness and agitation: Secondary | ICD-10-CM | POA: Diagnosis not present

## 2019-04-21 DIAGNOSIS — R109 Unspecified abdominal pain: Secondary | ICD-10-CM | POA: Diagnosis not present

## 2019-04-21 DIAGNOSIS — Z03818 Encounter for observation for suspected exposure to other biological agents ruled out: Secondary | ICD-10-CM | POA: Diagnosis not present

## 2019-04-21 DIAGNOSIS — N179 Acute kidney failure, unspecified: Secondary | ICD-10-CM | POA: Diagnosis not present

## 2019-04-21 DIAGNOSIS — R338 Other retention of urine: Secondary | ICD-10-CM | POA: Diagnosis not present

## 2019-04-21 DIAGNOSIS — F17200 Nicotine dependence, unspecified, uncomplicated: Secondary | ICD-10-CM | POA: Diagnosis present

## 2019-04-21 DIAGNOSIS — Z66 Do not resuscitate: Secondary | ICD-10-CM | POA: Diagnosis present

## 2019-04-21 DIAGNOSIS — F05 Delirium due to known physiological condition: Secondary | ICD-10-CM | POA: Diagnosis not present

## 2019-04-21 DIAGNOSIS — R319 Hematuria, unspecified: Secondary | ICD-10-CM | POA: Diagnosis not present

## 2019-04-21 DIAGNOSIS — Z79899 Other long term (current) drug therapy: Secondary | ICD-10-CM

## 2019-04-21 DIAGNOSIS — I10 Essential (primary) hypertension: Secondary | ICD-10-CM | POA: Diagnosis not present

## 2019-04-21 DIAGNOSIS — F0280 Dementia in other diseases classified elsewhere without behavioral disturbance: Secondary | ICD-10-CM | POA: Diagnosis not present

## 2019-04-21 DIAGNOSIS — J449 Chronic obstructive pulmonary disease, unspecified: Secondary | ICD-10-CM | POA: Diagnosis present

## 2019-04-21 DIAGNOSIS — N3289 Other specified disorders of bladder: Secondary | ICD-10-CM | POA: Diagnosis not present

## 2019-04-21 DIAGNOSIS — E785 Hyperlipidemia, unspecified: Secondary | ICD-10-CM | POA: Diagnosis present

## 2019-04-21 DIAGNOSIS — M5136 Other intervertebral disc degeneration, lumbar region: Secondary | ICD-10-CM | POA: Diagnosis not present

## 2019-04-21 DIAGNOSIS — Z87442 Personal history of urinary calculi: Secondary | ICD-10-CM

## 2019-04-21 DIAGNOSIS — Z885 Allergy status to narcotic agent status: Secondary | ICD-10-CM

## 2019-04-21 DIAGNOSIS — Z888 Allergy status to other drugs, medicaments and biological substances status: Secondary | ICD-10-CM

## 2019-04-21 LAB — CBC WITH DIFFERENTIAL/PLATELET
Abs Immature Granulocytes: 0.05 10*3/uL (ref 0.00–0.07)
Basophils Absolute: 0.1 10*3/uL (ref 0.0–0.1)
Basophils Relative: 1 %
Eosinophils Absolute: 0.3 10*3/uL (ref 0.0–0.5)
Eosinophils Relative: 4 %
HCT: 37.6 % — ABNORMAL LOW (ref 39.0–52.0)
Hemoglobin: 13.1 g/dL (ref 13.0–17.0)
Immature Granulocytes: 1 %
Lymphocytes Relative: 12 %
Lymphs Abs: 0.9 10*3/uL (ref 0.7–4.0)
MCH: 32.5 pg (ref 26.0–34.0)
MCHC: 34.8 g/dL (ref 30.0–36.0)
MCV: 93.3 fL (ref 80.0–100.0)
Monocytes Absolute: 0.4 10*3/uL (ref 0.1–1.0)
Monocytes Relative: 6 %
Neutro Abs: 5.3 10*3/uL (ref 1.7–7.7)
Neutrophils Relative %: 76 %
Platelets: 183 10*3/uL (ref 150–400)
RBC: 4.03 MIL/uL — ABNORMAL LOW (ref 4.22–5.81)
RDW: 14 % (ref 11.5–15.5)
WBC: 7 10*3/uL (ref 4.0–10.5)
nRBC: 0 % (ref 0.0–0.2)

## 2019-04-21 LAB — URINALYSIS, COMPLETE (UACMP) WITH MICROSCOPIC
RBC / HPF: >50 RBC/hpf (ref 0–5)
Specific Gravity, Urine: 1.032 — ABNORMAL HIGH (ref 1.005–1.030)

## 2019-04-21 LAB — BASIC METABOLIC PANEL
Anion gap: 9 (ref 5–15)
BUN: 19 mg/dL (ref 8–23)
CO2: 25 mmol/L (ref 22–32)
Calcium: 9 mg/dL (ref 8.9–10.3)
Chloride: 101 mmol/L (ref 98–111)
Creatinine, Ser: 1.46 mg/dL — ABNORMAL HIGH (ref 0.61–1.24)
GFR calc Af Amer: 52 mL/min — ABNORMAL LOW (ref >60)
GFR calc non Af Amer: 45 mL/min — ABNORMAL LOW (ref >60)
Glucose, Bld: 118 mg/dL — ABNORMAL HIGH (ref 70–99)
Potassium: 4.2 mmol/L (ref 3.5–5.1)
Sodium: 135 mmol/L (ref 135–145)

## 2019-04-21 LAB — SARS CORONAVIRUS 2 BY RT PCR (HOSPITAL ORDER, PERFORMED IN ~~LOC~~ HOSPITAL LAB): SARS Coronavirus 2: NEGATIVE

## 2019-04-21 MED ORDER — BISACODYL 5 MG PO TBEC
5.0000 mg | DELAYED_RELEASE_TABLET | Freq: Every day | ORAL | Status: DC | PRN
  Administered 2019-04-24: 5 mg via ORAL
  Filled 2019-04-21: qty 1

## 2019-04-21 MED ORDER — ONDANSETRON HCL 4 MG/2ML IJ SOLN
4.0000 mg | Freq: Once | INTRAMUSCULAR | Status: AC
  Administered 2019-04-21: 4 mg via INTRAVENOUS
  Filled 2019-04-21: qty 2

## 2019-04-21 MED ORDER — NICOTINE 14 MG/24HR TD PT24
14.0000 mg | MEDICATED_PATCH | Freq: Every day | TRANSDERMAL | Status: DC
  Administered 2019-04-21 – 2019-04-25 (×5): 14 mg via TRANSDERMAL
  Filled 2019-04-21 (×5): qty 1

## 2019-04-21 MED ORDER — BELLADONNA ALKALOIDS-OPIUM 16.2-60 MG RE SUPP
1.0000 | Freq: Three times a day (TID) | RECTAL | Status: DC | PRN
  Administered 2019-04-21: 1 via RECTAL
  Filled 2019-04-21: qty 1

## 2019-04-21 MED ORDER — FENTANYL CITRATE (PF) 100 MCG/2ML IJ SOLN
50.0000 ug | Freq: Once | INTRAMUSCULAR | Status: AC
  Administered 2019-04-21: 10:00:00 50 ug via INTRAVENOUS
  Filled 2019-04-21: qty 2

## 2019-04-21 MED ORDER — SODIUM CHLORIDE 0.9 % IR SOLN
3000.0000 mL | Status: DC
  Administered 2019-04-21 – 2019-04-23 (×8): 3000 mL

## 2019-04-21 MED ORDER — ONDANSETRON HCL 4 MG PO TABS: 4.0000 mg | ORAL_TABLET | Freq: Four times a day (QID) | ORAL | Status: DC | PRN

## 2019-04-21 MED ORDER — ACETAMINOPHEN 325 MG PO TABS
650.0000 mg | ORAL_TABLET | Freq: Four times a day (QID) | ORAL | Status: DC | PRN
  Administered 2019-04-23: 14:00:00 650 mg via ORAL
  Filled 2019-04-21: qty 2

## 2019-04-21 MED ORDER — FENTANYL CITRATE (PF) 100 MCG/2ML IJ SOLN
50.0000 ug | Freq: Once | INTRAMUSCULAR | Status: AC
  Administered 2019-04-21: 50 ug via INTRAVENOUS
  Filled 2019-04-21: qty 2

## 2019-04-21 MED ORDER — ONDANSETRON HCL 4 MG/2ML IJ SOLN
4.0000 mg | Freq: Four times a day (QID) | INTRAMUSCULAR | Status: DC | PRN
  Administered 2019-04-24: 05:00:00 4 mg via INTRAVENOUS
  Filled 2019-04-21: qty 2

## 2019-04-21 MED ORDER — SODIUM CHLORIDE 0.9 % IR SOLN
3000.0000 mL | Status: DC
  Administered 2019-04-21 (×2): 3000 mL

## 2019-04-21 MED ORDER — SODIUM CHLORIDE 0.9 % IV SOLN
INTRAVENOUS | Status: DC
  Administered 2019-04-21 – 2019-04-25 (×7): via INTRAVENOUS

## 2019-04-21 MED ORDER — ACETAMINOPHEN 650 MG RE SUPP: 650.0000 mg | Freq: Four times a day (QID) | RECTAL | Status: DC | PRN

## 2019-04-21 MED ORDER — ALBUTEROL SULFATE (2.5 MG/3ML) 0.083% IN NEBU: 2.5000 mg | INHALATION_SOLUTION | RESPIRATORY_TRACT | Status: DC | PRN

## 2019-04-21 MED ORDER — METOPROLOL TARTRATE 5 MG/5ML IV SOLN
2.5000 mg | INTRAVENOUS | Status: DC | PRN
  Administered 2019-04-21 – 2019-04-24 (×2): 2.5 mg via INTRAVENOUS
  Filled 2019-04-21 (×2): qty 5

## 2019-04-21 MED ORDER — HYDROCODONE-ACETAMINOPHEN 5-325 MG PO TABS
1.0000 | ORAL_TABLET | ORAL | Status: DC | PRN
  Administered 2019-04-21: 1 via ORAL
  Administered 2019-04-22 (×4): 2 via ORAL
  Filled 2019-04-21 (×3): qty 2
  Filled 2019-04-21: qty 1
  Filled 2019-04-21: qty 2

## 2019-04-21 MED ORDER — SENNOSIDES-DOCUSATE SODIUM 8.6-50 MG PO TABS: 1.0000 | ORAL_TABLET | Freq: Every evening | ORAL | Status: DC | PRN

## 2019-04-21 NOTE — Telephone Encounter (Signed)
Patient's wife called the office 2 days ago reporting that her husband was experiencing left flank pain and gross hematuria.  He has a PMH nephrolithiasis and a solitary left kidney.  I ordered stat CT for evaluation of possible acute stone episode.  CT scheduling reports they were unable to reach the patient and scheduled his imaging for tomorrow.  I called the patient again this morning to ask them to reschedule his CT for ASAP. His wife reported that he is having increased pain and passing blood clots per urine. I counseled them to proceed to the ED this morning for urgent evaluation including CT. She expressed understanding.  Patient reports being NPO since last night, he has had some fluid this morning. I counseled him to remain NPO pending possible surgery later today.

## 2019-04-21 NOTE — Progress Notes (Signed)
Advanced Care Plan.  Purpose of Encounter: CODE STATUS. Parties in Attendance: The patient, his wife and me. Patient's Decisional Capacity: Yes. Medical Story: Russell Terry  is a 78 y.o. male with a known history of A. fib, BPH, right renal carcinoma status post nephrectomy, COPD, CKD, hypertension, hyperlipidemia, Parkinson's, dementia and etc.  The patient is being admitted for hematuria and bladder hemorrhage.  I discussed with the patient about his current condition, treatment plan, prognosis and CODE STATUS.  The patient does not want to be resuscitated or intubated if he has cardiopulmonary arrest.  Plan:  Code Status: DNR. Time spent discussing advance care planning: 18 minutes.

## 2019-04-21 NOTE — ED Provider Notes (Signed)
[facility] Emergency Department Provider Note  ____________________________________________  Time seen: Approximately 12:24 PM  I have reviewed the triage vital signs and the nursing notes.   HISTORY  Chief Complaint Hematuria    HPI Russell Terry is a 78 y.o. male with a history of atrial fibrillation, BPH, CKD, solitary left kidney due to right nephrectomy from [facility], COPD  who comes the ED complaining of gross hematuria since last night, severe pain that is unremitting constant nonradiating, no aggravating or alleviating factors in the lower abdomen.     Past Medical History:  Diagnosis Date  . Atrial fibrillation ([place]) 12/11/2013  . BPH (benign prostatic hyperplasia)   . Bradycardia 12/11/2013  . Calculus of kidney 12/06/2013  . Cancer ([place])    rt kidney removed  . Chronic kidney disease    stones,rt kidney removed  . COPD (chronic obstructive pulmonary disease) ([place]) 12/11/2013  . DDD (degenerative disc disease), lumbar 12/13/2013  . Delirium   . Dementia ([place])   . Elevated prostate specific antigen (PSA) 03/03/2013  . Encephalopathy acute 07/22/2016  . Enlarged prostate with lower urinary tract symptoms (LUTS) 03/03/2013  . Essential hypertension 07/22/2016  . Hyperlipidemia, unspecified 07/09/2017  . Hypertension   . Meningioma ([place]) 10/08/2016  . Parkinsonian features 10/08/2016  . Pleuritic chest pain 05/31/2014  . Renal cell carcinoma ([place]) 05/04/2016  . Renal cyst, acquired, left 04/22/2015     Patient Active Problem List   Diagnosis Date Noted  . Urinary frequency 02/20/2019  . Urinary urgency 02/20/2019  . History of elevated PSA 02/20/2019  . History of renal cell carcinoma 02/20/2019  . UTI (urinary tract infection) 01/05/2019  . Personal history of kidney cancer 01/08/2018  . Hyperlipidemia, unspecified 07/09/2017  . Anxiety, generalized 10/08/2016  . Meningioma ([place]) 10/08/2016  . Insomnia, persistent 10/08/2016  . Parkinsonian  features 10/08/2016  . Agitation 07/22/2016  . Encephalopathy acute 07/22/2016  . Essential hypertension 07/22/2016  . Pseudomonas urinary tract infection 07/22/2016  . Alzheimer's dementia without behavioral disturbance ([place]) 04/18/2016  . Neck pain 11/15/2015  . Lumbar stenosis with neurogenic claudication 10/18/2015  . Renal cyst, acquired, left 04/22/2015  . Pleuritic chest pain 05/31/2014  . History of rectal bleeding 01/11/2014  . History of kidney removal 12/22/2013  . DDD (degenerative disc disease), lumbar 12/13/2013  . Atrial fibrillation ([place]) 12/11/2013  . Bradycardia 12/11/2013  . COPD (chronic obstructive pulmonary disease) ([place]) 12/11/2013  . Nephrolithiasis 12/06/2013  . Other specified disorders of kidney and ureter 11/17/2013  . Benign prostatic hyperplasia with lower urinary tract symptoms 03/03/2013  . Elevated prostate specific antigen (PSA) 03/03/2013  . Incomplete emptying of bladder 03/03/2013  . Nocturia 03/03/2013     Past Surgical History:  Procedure Laterality Date  . BRAIN SURGERY  2017  . COLON SURGERY    . COLONOSCOPY WITH PROPOFOL N/A 12/31/2016   Procedure: COLONOSCOPY WITH PROPOFOL;  Surgeon: Lollie Sails, MD;  Location: [facility] ENDOSCOPY;  Service: Endoscopy;  Laterality: N/A;  . [place]    . LUMBAR LAMINECTOMY/DECOMPRESSION MICRODISCECTOMY N/A 10/18/2015   Procedure: LUMBAR LAMINECTOMY/DECOMPRESSION MICRODISCECTOMY 3 LEVELS;  Surgeon: Newman Pies, MD;  Location: [place] NEURO ORS;  Service: Neurosurgery;  Laterality: N/A;  L23 L34 L45 laminectomy and foraminotomy  . NEPHRECTOMY     2012  . URETEROSCOPY WITH HOLMIUM LASER LITHOTRIPSY       Prior to Admission medications   Medication Sig Start Date End Date Taking? Authorizing Provider  albuterol (VENTOLIN HFA) 108 (90  Base) MCG/ACT inhaler INHALE 2 PUFFS BY MOUTH EVERY 6 HOURS AS NEEDED FOR WHEEZE 01/11/19   [provider]  aspirin EC 81 MG tablet Take 81 mg by  mouth daily.    [provider]  carbidopa-levodopa (SINEMET IR) 25-100 MG tablet Take 2 tablets by mouth 3 (three) times daily.  05/03/17   [provider]  diltiazem (CARDIZEM CD) 240 MG 24 hr capsule TAKE 1 CAPSULE (240 MG TOTAL) BY MOUTH EVERY MORNING. 11/12/17   [provider]  diphenhydrAMINE HCl (BENADRYL ALLERGY PO) Take by mouth.    [provider]  docusate sodium (COLACE) 100 MG capsule Take 100 mg by mouth at bedtime.    [provider]  donepezil (ARICEPT) 10 MG tablet TAKE 1 TABLET BY MOUTH EVERY DAY AT NIGHT 06/27/17   [provider]  doxazosin (CARDURA) 4 MG tablet Take 4 mg by mouth daily.    [provider]  dutasteride (AVODART) 0.5 MG capsule Take 0.5 mg by mouth daily.    [provider]  Omeprazole 20 MG TBEC Take by mouth. 03/13/14   [provider]  polyethylene glycol (MIRALAX / GLYCOLAX) packet Take 17 g by mouth daily.  12/12/13   [provider]  tamsulosin (FLOMAX) 0.4 MG CAPS capsule Take 2 capsules (0.8 mg total) by mouth 2 (two) times daily. 03/24/19   Stoioff, Ronda Fairly, MD  traZODone (DESYREL) 100 MG tablet Take 200 mg by mouth at bedtime.  07/04/18   [provider]  venlafaxine XR (EFFEXOR-XR) 75 MG 24 hr capsule Take 75 mg by mouth at bedtime.  04/14/18 04/14/19  [provider]  vitamin B-12 (CYANOCOBALAMIN) 1000 MCG tablet Take 1,000 mcg by mouth at bedtime.     [provider]     Allergies Quinapril, Ambien [zolpidem tartrate], Fenofibrate, Zolpidem, Amoxicillin-pot clavulanate, and Morphine   Family History  Problem Relation Age of Onset  . Bladder Cancer Neg Hx   . Kidney cancer Neg Hx   . Prolactinoma Neg Hx   . Prostate cancer Neg Hx     Social History Social History   Tobacco Use  . Smoking status: Current Every Day Smoker    Packs/day: 1.00    Years: 50.00    Pack years: 50.00  . Smokeless tobacco: Never Used  Substance Use  Topics  . Alcohol use: No  . Drug use: No    Review of Systems  Constitutional:   No fever or chills.  ENT:   No sore throat. No rhinorrhea. Cardiovascular:   No chest pain or syncope. Respiratory:   No dyspnea or cough. Gastrointestinal: Positive for lower abdominal pain without vomiting and diarrhea.  Musculoskeletal:   Negative for focal pain or swelling All other systems reviewed and are negative except as documented above in ROS and HPI.  ____________________________________________   PHYSICAL EXAM:  VITAL SIGNS: ED Triage Vitals  Enc Vitals Group     BP 04/21/19 0913 (!) 181/113     Pulse Rate 04/21/19 0913 98     Resp 04/21/19 0913 18     Temp 04/21/19 0913 97.8 F (36.6 C)     Temp Source 04/21/19 0913 Oral     SpO2 04/21/19 0913 98 %     Weight 04/21/19 0910 170 lb (77.1 kg)     Height 04/21/19 0910 5\' 10"  (1.778 m)     Head Circumference --      Peak Flow --      Pain Score  04/21/19 0910 10     Pain Loc --      Pain Edu? --      Excl. in [place]? --     Vital signs reviewed, nursing assessments reviewed.   Constitutional:   Alert and oriented. Non-toxic appearance. Eyes:   Conjunctivae are normal. EOMI. PERRL. ENT      Head:   Normocephalic and atraumatic.      Nose:   Wearing a mask.      Mouth/Throat:   Wearing a mask.      Neck:   No meningismus. Full ROM. Hematological/Lymphatic/Immunilogical:   No cervical lymphadenopathy. Cardiovascular:   Irregularly irregular rhythm, rate controlled.. Symmetric bilateral radial and DP pulses.  No murmurs. Cap refill less than 2 seconds. Respiratory:   Normal respiratory effort without tachypnea/retractions. Breath sounds are clear and equal bilaterally. No wheezes/rales/rhonchi. Gastrointestinal:   Soft with suprapubic tenderness. Non distended. There is no CVA tenderness.  No rebound, rigidity, or guarding.  Genitourinary:   Externally normal genitalia with bloody urine with clots coming through the urethral  meatus Musculoskeletal:   Normal range of motion in all extremities. No joint effusions.  No lower extremity tenderness.  No edema. Neurologic:   Normal speech and language.  Motor grossly intact. No acute focal neurologic deficits are appreciated.  Skin:    Skin is warm, dry and intact. No rash noted.  No petechiae, purpura, or bullae.  ____________________________________________    LABS (pertinent positives/negatives) (all labs ordered are listed, but only abnormal results are displayed) Labs Reviewed  BASIC METABOLIC PANEL - Abnormal; Notable for the following components:      Result Value   Glucose, Bld 118 (*)    Creatinine, Ser 1.46 (*)    GFR calc non Af Amer 45 (*)    GFR calc Af Amer 52 (*)    All other components within normal limits  CBC WITH DIFFERENTIAL/PLATELET - Abnormal; Notable for the following components:   RBC 4.03 (*)    HCT 37.6 (*)    All other components within normal limits  URINALYSIS, COMPLETE (UACMP) WITH MICROSCOPIC - Abnormal; Notable for the following components:   Color, Urine RED (*)    APPearance CLOUDY (*)    Specific Gravity, Urine 1.032 (*)    Glucose, UA   (*)    Value: TEST NOT REPORTED DUE TO COLOR INTERFERENCE OF URINE PIGMENT   Hgb urine dipstick   (*)    Value: TEST NOT REPORTED DUE TO COLOR INTERFERENCE OF URINE PIGMENT   Bilirubin Urine   (*)    Value: TEST NOT REPORTED DUE TO COLOR INTERFERENCE OF URINE PIGMENT   Ketones, ur   (*)    Value: TEST NOT REPORTED DUE TO COLOR INTERFERENCE OF URINE PIGMENT   Protein, ur   (*)    Value: TEST NOT REPORTED DUE TO COLOR INTERFERENCE OF URINE PIGMENT   Nitrite   (*)    Value: TEST NOT REPORTED DUE TO COLOR INTERFERENCE OF URINE PIGMENT   Leukocytes,Ua   (*)    Value: TEST NOT REPORTED DUE TO COLOR INTERFERENCE OF URINE PIGMENT   Bacteria, UA PRESENT (*)    All other components within normal limits  URINE CULTURE  SARS CORONAVIRUS 2 (TAT 6-24 HRS)    ____________________________________________   EKG    ____________________________________________    RADIOLOGY  Ct Renal Stone Study  Result Date: 04/21/2019 CLINICAL DATA:  Acute left flank pain, gross hematuria. EXAM: CT ABDOMEN AND PELVIS WITHOUT CONTRAST  TECHNIQUE: Multidetector CT imaging of the abdomen and pelvis was performed following the standard protocol without IV contrast. COMPARISON:  CT scan of August 03, 2015. FINDINGS: Lower chest: No acute abnormality. Hepatobiliary: No focal liver abnormality is seen. No gallstones, gallbladder wall thickening, or biliary dilatation. Pancreas: Unremarkable. No pancreatic ductal dilatation or surrounding inflammatory changes. Spleen: Normal in size without focal abnormality. Adrenals/Urinary Tract: Adrenal glands appear normal. Status post right nephrectomy. Nonobstructive left nephrolithiasis is noted. Probable 14 mm hyperdense cyst is noted in upper pole of left kidney. No hydronephrosis or renal obstruction is noted. Large amount of high density material is noted within the urinary bladder lumen most consistent with hemorrhage. Stomach/Bowel: Status post right colectomy. The stomach is unremarkable. There is no evidence of bowel obstruction or inflammation. Sigmoid diverticulosis is noted without inflammation. Vascular/Lymphatic: Aortic atherosclerosis. No enlarged abdominal or pelvic lymph nodes. Reproductive: Stable moderate prostatic enlargement is noted. Other: No abdominal wall hernia or abnormality. No abdominopelvic ascites. Musculoskeletal: Severe multilevel degenerative disc disease is noted in the lower lumbar spine. No acute abnormality is noted. IMPRESSION: Large amount of hemorrhage is noted within the urinary bladder lumen. Cystoscopy is recommended to evaluate for possible neoplasm. Nonobstructive left nephrolithiasis is noted. No hydronephrosis or renal obstruction is noted. Status post right nephrectomy. 14 mm rounded  hyperdense abnormality is noted in upper pole of left kidney. This may simply represent hyperdense cyst, but renal ultrasound is recommended to rule out potential neoplasm. Moderate prostatic enlargement is again noted. Sigmoid diverticulosis without inflammation. Aortic Atherosclerosis (ICD10-I70.0). Electronically Signed   By: Marijo Conception M.D.   On: 04/21/2019 10:24    ____________________________________________   PROCEDURES Procedures  ____________________________________________  DIFFERENTIAL DIAGNOSIS   Ureterolithiasis, bladder outlet obstruction, bladder hemorrhage  CLINICAL IMPRESSION / ASSESSMENT AND PLAN / ED COURSE  Medications ordered in the ED: Medications  sodium chloride irrigation 0.9 % 3,000 mL (3,000 mLs Irrigation New Bag/Given 04/21/19 1110)  fentaNYL (SUBLIMAZE) injection 50 mcg (50 mcg Intravenous Given 04/21/19 0934)  fentaNYL (SUBLIMAZE) injection 50 mcg (50 mcg Intravenous Given 04/21/19 1030)  ondansetron (ZOFRAN) injection 4 mg (4 mg Intravenous Given 04/21/19 1056)    Pertinent labs & imaging results that were available during my care of the patient were reviewed by me and considered in my medical decision making (see chart for details).  Russell Terry was evaluated in Emergency Department on 04/21/2019 for the symptoms described in the history of present illness. He was evaluated in the context of the global COVID-19 pandemic, which necessitated consideration that the patient might be at risk for infection with the SARS-CoV-2 virus that causes COVID-19. Institutional protocols and algorithms that pertain to the evaluation of patients at risk for COVID-19 are in a state of rapid change based on information released by regulatory bodies including the CDC and federal and state organizations. These policies and algorithms were followed during the patient's care in the ED.   Patient presents with suprapubic pain, gross hematuria, concerning for bladder  hemorrhage resulting in urinary obstruction.  Will obtain CT scan to further evaluate, likely need CBI and admission  Clinical Course as of Apr 20 1224  Wed Apr 21, 2019  1024 CT does not show any evidence of ureterolithiasis.  There is heterogeneous fluid in the bladder consistent with coagulating blood.  Will start bladder irrigation.   [PS]    Clinical Course User Index [PS] Carrie Mew, MD     ----------------------------------------- 12:28 PM on 04/21/2019 -----------------------------------------  CT report confirms  bladder full of blood, no kidney complication.  Discussed with urology Dr. Erlene Quan who agrees with CBI and admission for now.  ____________________________________________   FINAL CLINICAL IMPRESSION(S) / ED DIAGNOSES    Final diagnoses:  Gross hematuria  Urinary retention     ED Discharge Orders    None      Portions of this note were generated with dragon dictation software. Dictation errors may occur despite best attempts at proofreading.   Carrie Mew, MD 04/21/19 1228

## 2019-04-21 NOTE — H&P (Addendum)
[facility] [place] [facility] NAME: Russell Terry    MR#:  [id number]  DATE OF BIRTH:  [birth year]  DATE OF ADMISSION:  04/21/2019  PRIMARY CARE PHYSICIAN: Maryland Pink, MD   REQUESTING/REFERRING PHYSICIAN: Dr. Joni Fears.  CHIEF COMPLAINT:   Chief Complaint  Patient presents with  . Hematuria   Hematuria since yesterday. HISTORY OF PRESENT ILLNESS:  Russell Terry  is a 78 y.o. male with a known history of A. fib, BPH, right renal carcinoma status post nephrectomy, COPD, CKD, hypertension, hyperlipidemia, Parkinson's, dementia and etc.  The patient presents the ED with above chief complaints. The patient has had hematuria which is pure blood since yesterday.  He denies any abdominal pain, nausea or vomiting, no fever, chills, dysuria.  ED physician discussed with Dr. Erlene Quan, started CBI and request admission. PAST MEDICAL HISTORY:   Past Medical History:  Diagnosis Date  . Atrial fibrillation ([place]) 12/11/2013  . BPH (benign prostatic hyperplasia)   . Bradycardia 12/11/2013  . Calculus of kidney 12/06/2013  . Cancer ([place])    rt kidney removed  . Chronic kidney disease    stones,rt kidney removed  . COPD (chronic obstructive pulmonary disease) ([place]) 12/11/2013  . DDD (degenerative disc disease), lumbar 12/13/2013  . Delirium   . Dementia ([place])   . Elevated prostate specific antigen (PSA) 03/03/2013  . Encephalopathy acute 07/22/2016  . Enlarged prostate with lower urinary tract symptoms (LUTS) 03/03/2013  . Essential hypertension 07/22/2016  . Hyperlipidemia, unspecified 07/09/2017  . Hypertension   . Meningioma ([place]) 10/08/2016  . Parkinsonian features 10/08/2016  . Pleuritic chest pain 05/31/2014  . Renal cell carcinoma ([place]) 05/04/2016  . Renal cyst, acquired, left 04/22/2015    PAST SURGICAL HISTORY:   Past Surgical History:  Procedure Laterality Date  . BRAIN SURGERY  2017  . COLON SURGERY    . COLONOSCOPY WITH PROPOFOL N/A  12/31/2016   Procedure: COLONOSCOPY WITH PROPOFOL;  Surgeon: Lollie Sails, MD;  Location: [facility] ENDOSCOPY;  Service: Endoscopy;  Laterality: N/A;  . [place]    . LUMBAR LAMINECTOMY/DECOMPRESSION MICRODISCECTOMY N/A 10/18/2015   Procedure: LUMBAR LAMINECTOMY/DECOMPRESSION MICRODISCECTOMY 3 LEVELS;  Surgeon: Newman Pies, MD;  Location: [facility] NEURO ORS;  Service: Neurosurgery;  Laterality: N/A;  L23 L34 L45 laminectomy and foraminotomy  . NEPHRECTOMY     2012  . URETEROSCOPY WITH HOLMIUM LASER LITHOTRIPSY      SOCIAL HISTORY:   Social History   Tobacco Use  . Smoking status: Current Every Day Smoker    Packs/day: 1.00    Years: 50.00    Pack years: 50.00  . Smokeless tobacco: Never Used  Substance Use Topics  . Alcohol use: No    FAMILY HISTORY:   Family History  Problem Relation Age of Onset  . Bladder Cancer Neg Hx   . Kidney cancer Neg Hx   . Prolactinoma Neg Hx   . Prostate cancer Neg Hx     DRUG ALLERGIES:   Allergies  Allergen Reactions  . Quinapril Cough  . Ambien [Zolpidem Tartrate] Other (See Comments)    Unspecified reaction  . Fenofibrate Other (See Comments)    Unknown  . Zolpidem     Other reaction(s): Other (See Comments) Unspecified reaction  . Amoxicillin-Pot Clavulanate Nausea And Vomiting  . Morphine Nausea Only    REVIEW OF SYSTEMS:   Review of Systems  Constitutional: Negative for chills, fever and malaise/fatigue.  HENT: Negative for sore throat.   Eyes:  Negative for blurred vision and double vision.  Respiratory: Negative for cough, hemoptysis, shortness of breath, wheezing and stridor.   Cardiovascular: Negative for chest pain, palpitations, orthopnea and leg swelling.  Gastrointestinal: Negative for abdominal pain, blood in stool, diarrhea, melena, nausea and vomiting.  Genitourinary: Positive for hematuria. Negative for dysuria and flank pain.  Musculoskeletal: Negative for back pain and joint pain.  Skin:  Negative for rash.  Neurological: Negative for dizziness, sensory change, focal weakness, seizures, loss of consciousness, weakness and headaches.  Endo/Heme/Allergies: Negative for polydipsia.  Psychiatric/Behavioral: Negative for depression. The patient is not nervous/anxious.     MEDICATIONS AT HOME:   Prior to Admission medications   Medication Sig Start Date End Date Taking? Authorizing Provider  albuterol (VENTOLIN HFA) 108 (90 Base) MCG/ACT inhaler INHALE 2 PUFFS BY MOUTH EVERY 6 HOURS AS NEEDED FOR WHEEZE 01/11/19   [provider]  aspirin EC 81 MG tablet Take 81 mg by mouth daily.    [provider]  carbidopa-levodopa (SINEMET IR) 25-100 MG tablet Take 2 tablets by mouth 3 (three) times daily.  05/03/17   [provider]  diltiazem (CARDIZEM CD) 240 MG 24 hr capsule TAKE 1 CAPSULE (240 MG TOTAL) BY MOUTH EVERY MORNING. 11/12/17   [provider]  diphenhydrAMINE HCl (BENADRYL ALLERGY PO) Take by mouth.    [provider]  docusate sodium (COLACE) 100 MG capsule Take 100 mg by mouth at bedtime.    [provider]  donepezil (ARICEPT) 10 MG tablet TAKE 1 TABLET BY MOUTH EVERY DAY AT NIGHT 06/27/17   [provider]  doxazosin (CARDURA) 4 MG tablet Take 4 mg by mouth daily.    [provider]  dutasteride (AVODART) 0.5 MG capsule Take 0.5 mg by mouth daily.    [provider]  Omeprazole 20 MG TBEC Take by mouth. 03/13/14   [provider]  polyethylene glycol (MIRALAX / GLYCOLAX) packet Take 17 g by mouth daily.  12/12/13   [provider]  tamsulosin (FLOMAX) 0.4 MG CAPS capsule Take 2 capsules (0.8 mg total) by mouth 2 (two) times daily. 03/24/19   Stoioff, Ronda Fairly, MD  traZODone (DESYREL) 100 MG tablet Take 200 mg by mouth at bedtime.  07/04/18   [provider]  venlafaxine XR (EFFEXOR-XR) 75 MG 24 hr capsule Take 75 mg by mouth at bedtime.  04/14/18 04/14/19  [provider]  vitamin B-12 (CYANOCOBALAMIN) 1000 MCG tablet Take 1,000 mcg by mouth at bedtime.     [provider]      VITAL SIGNS:  Blood pressure (!) 170/108, pulse (!) 105, temperature 97.8 F (36.6 C), temperature source Oral, resp. rate 16, height 5\' 10"  (1.778 m), weight 77.1 kg, SpO2 97 %.  PHYSICAL EXAMINATION:  Physical Exam  GENERAL:  78 y.o.-year-old patient lying in the bed with no acute distress.  EYES: Pupils equal, round, reactive to light and accommodation. No scleral icterus. Extraocular muscles intact.  HEENT: Head atraumatic, normocephalic.  NECK:  Supple, no jugular venous distention. No thyroid enlargement, no tenderness.  LUNGS: Normal breath sounds bilaterally, no wheezing, rales,rhonchi or crepitation. No use of accessory muscles of respiration.  CARDIOVASCULAR: S1, S2 normal. No murmurs, rubs, or gallops.  ABDOMEN: Soft, nontender, nondistended. Bowel sounds present. No organomegaly or mass.  EXTREMITIES: No pedal edema, cyanosis, or clubbing.  NEUROLOGIC: Cranial nerves II through XII are intact. Muscle strength 5/5 in all extremities. Sensation intact. Gait not checked.  PSYCHIATRIC: The patient is  alert and oriented x 3.  SKIN: No obvious rash, lesion, or ulcer.   LABORATORY PANEL:   CBC Recent Labs  Lab 04/21/19 0925  WBC 7.0  HGB 13.1  HCT 37.6*  PLT 183   ------------------------------------------------------------------------------------------------------------------  Chemistries  Recent Labs  Lab 04/21/19 0925  NA 135  K 4.2  CL 101  CO2 25  GLUCOSE 118*  BUN 19  CREATININE 1.46*  CALCIUM 9.0   ------------------------------------------------------------------------------------------------------------------  Cardiac Enzymes No results for input(s): TROPONINI in the last 168 hours. ------------------------------------------------------------------------------------------------------------------  RADIOLOGY:  Ct Renal Stone  Study  Result Date: 04/21/2019 CLINICAL DATA:  Acute left flank pain, gross hematuria. EXAM: CT ABDOMEN AND PELVIS WITHOUT CONTRAST TECHNIQUE: Multidetector CT imaging of the abdomen and pelvis was performed following the standard protocol without IV contrast. COMPARISON:  CT scan of August 03, 2015. FINDINGS: Lower chest: No acute abnormality. Hepatobiliary: No focal liver abnormality is seen. No gallstones, gallbladder wall thickening, or biliary dilatation. Pancreas: Unremarkable. No pancreatic ductal dilatation or surrounding inflammatory changes. Spleen: Normal in size without focal abnormality. Adrenals/Urinary Tract: Adrenal glands appear normal. Status post right nephrectomy. Nonobstructive left nephrolithiasis is noted. Probable 14 mm hyperdense cyst is noted in upper pole of left kidney. No hydronephrosis or renal obstruction is noted. Large amount of high density material is noted within the urinary bladder lumen most consistent with hemorrhage. Stomach/Bowel: Status post right colectomy. The stomach is unremarkable. There is no evidence of bowel obstruction or inflammation. Sigmoid diverticulosis is noted without inflammation. Vascular/Lymphatic: Aortic atherosclerosis. No enlarged abdominal or pelvic lymph nodes. Reproductive: Stable moderate prostatic enlargement is noted. Other: No abdominal wall hernia or abnormality. No abdominopelvic ascites. Musculoskeletal: Severe multilevel degenerative disc disease is noted in the lower lumbar spine. No acute abnormality is noted. IMPRESSION: Large amount of hemorrhage is noted within the urinary bladder lumen. Cystoscopy is recommended to evaluate for possible neoplasm. Nonobstructive left nephrolithiasis is noted. No hydronephrosis or renal obstruction is noted. Status post right nephrectomy. 14 mm rounded hyperdense abnormality is noted in upper pole of left kidney. This may simply represent hyperdense cyst, but renal ultrasound is recommended to rule out  potential neoplasm. Moderate prostatic enlargement is again noted. Sigmoid diverticulosis without inflammation. Aortic Atherosclerosis (ICD10-I70.0). Electronically Signed   By: Marijo Conception M.D.   On: 04/21/2019 10:24      IMPRESSION AND PLAN:   Hematuria and bladder hemorrhage.  Rule out bladder cancer.  History of right renal carcinoma nephrectomy. The patient will be admitted to medical floor. Continue CBI, follow-up with Dr. Ray[place] Ma.  Hold aspirin.  Acute renal failure on CKD.  IV fluid support and follow-up BMP.  Left nonobstructive nephrolithiasis.  Follow-up with urologist.  History of BPH.  Continue Flomax.  History of chronic A. fib.  Hold aspirin and continue Cardizem. Hypertension.  Continue Cardizem and Cardura.  Lopressor PRN.  All the records are reviewed and case discussed with ED provider. Management plans discussed with the patient, his wife and they are in agreement.  CODE STATUS:   TOTAL TIME TAKING CARE OF THIS PATIENT: 52 minutes.    Demetrios Loll M.D on 04/21/2019 at 12:41 PM  Between 7am to 6pm - Pager - [phone]  After 6pm go to [email or web address] - Proofreader  [facility]  Office  [phone]  CC: Primary care physician; Maryland Pink, MD   Note: This dictation was prepared with Dragon dictation along with smaller phrase technology. Any transcriptional errors that result from this process are  unin

## 2019-04-21 NOTE — ED Triage Notes (Signed)
Has been urinating a lot of blood.  Seeing urologist.  All night severe pain.  ? Retention.

## 2019-04-21 NOTE — Consult Note (Signed)
Urology Consult  I have been asked to see the patient by Dr. Joni Fears, for evaluation and management of clot retention.  Chief Complaint: Lower abdominal pain, inability to urinate  History of Present Illness: Russell Terry is a 78 y.o. year old male who presented to the emergency department this morning with a 2-day history of gross hematuria, left flank pain, lower abdominal pain, and increasing difficulty urinating.  PMH significant for solitary left kidney following right nephrectomy for RCC in 2015, recurrent nephrolithiasis, elevated PSA, and BPH with LUTS.  CT stone study in the emergency department with nonobstructive left nephrolithiasis, a likely hyperdense cyst on the upper pole of the left kidney, and a large clot burden within the urinary bladder.  Patient had a similar episode of gross hematuria with clot retention requiring OR irrigation and clot evacuation in 2017.  He underwent subsequent prostatic fulguration with immediate improvement in hematuria.  Creatinine 1.46 upon presentation, baseline between 1.1 and 1.25.  Hemoglobin 13.1.  Patient is afebrile and hypertensive to the 180s/110s.  Past Medical History:  Diagnosis Date  . Atrial fibrillation ([place]) 12/11/2013  . BPH (benign prostatic hyperplasia)   . Bradycardia 12/11/2013  . Calculus of kidney 12/06/2013  . Cancer ([place])    rt kidney removed  . Chronic kidney disease    stones,rt kidney removed  . COPD (chronic obstructive pulmonary disease) ([place]) 12/11/2013  . DDD (degenerative disc disease), lumbar 12/13/2013  . Delirium   . Dementia ([place])   . Elevated prostate specific antigen (PSA) 03/03/2013  . Encephalopathy acute 07/22/2016  . Enlarged prostate with lower urinary tract symptoms (LUTS) 03/03/2013  . Essential hypertension 07/22/2016  . Hyperlipidemia, unspecified 07/09/2017  . Hypertension   . Meningioma ([place]) 10/08/2016  . Parkinsonian features 10/08/2016  . Pleuritic chest pain 05/31/2014  . Renal  cell carcinoma ([place]) 05/04/2016  . Renal cyst, acquired, left 04/22/2015    Past Surgical History:  Procedure Laterality Date  . BRAIN SURGERY  2017  . COLON SURGERY    . COLONOSCOPY WITH PROPOFOL N/A 12/31/2016   Procedure: COLONOSCOPY WITH PROPOFOL;  Surgeon: Lollie Sails, MD;  Location: [facility] END[facility]COPY;  Service: Endoscopy;  Laterality: N/A;  . [place]    . LUMBAR LAMINECTOMY/DECOMPRESSION MICRODISCECTOMY N/A 10/18/2015   Procedure: LUMBAR LAMINECTOMY/DECOMPRESSION MICRODISCECTOMY 3 LEVELS;  Surgeon: Newman Pies, MD;  Location: [place] NEURO ORS;  Service: Neurosurgery;  Laterality: N/A;  L23 L34 L45 laminectomy and foraminotomy  . NEPHRECTOMY     2012  . URETEROSCOPY WITH HOLMIUM LASER LITHOTRIPSY      Home Medications:  Current Meds  Medication Sig  . albuterol (VENTOLIN HFA) 108 (90 Base) MCG/ACT inhaler Inhale 2 puffs into the lungs every 6 (six) hours as needed for wheezing or shortness of breath.   Marland Kitchen aspirin EC 81 MG tablet Take 81 mg by mouth daily.  . carbidopa-levodopa (SINEMET CR) 50-200 MG tablet Take 1 tablet by mouth 2 (two) times daily.  . carbidopa-levodopa (SINEMET IR) 25-100 MG tablet Take 2 tablets by mouth 3 (three) times daily.   . cetirizine (ZYRTEC) 10 MG tablet Take 10 mg by mouth daily.  Marland Kitchen diltiazem (CARDIZEM CD) 240 MG 24 hr capsule Take 240 mg by mouth daily.   . diphenhydrAMINE (BENADRYL ALLERGY) 25 MG tablet Take 25 mg by mouth daily as needed (itching or allergies).   . donepezil (ARICEPT) 10 MG tablet Take 10 mg by mouth every evening.   Marland Kitchen doxazosin (CARDURA) 4 MG tablet  Take 4 mg by mouth daily.  Marland Kitchen dutasteride (AVODART) 0.5 MG capsule Take 0.5 mg by mouth daily.  . Melatonin 5 MG TABS Take 5 mg by mouth at bedtime.  . tamsulosin (FLOMAX) 0.4 MG CAPS capsule Take 0.4 mg by mouth 2 (two) times daily.  . traZODone (DESYREL) 100 MG tablet Take 200 mg by mouth at bedtime.   Marland Kitchen venlafaxine XR (EFFEXOR-XR) 75 MG 24 hr capsule Take 75 mg  by mouth at bedtime.   . vitamin B-12 (CYANOCOBALAMIN) 1000 MCG tablet Take 1,000 mcg by mouth at bedtime.     Allergies:  Allergies  Allergen Reactions  . Quinapril Cough  . Ambien [Zolpidem Tartrate] Other (See Comments)    Unspecified reaction  . Fenofibrate Other (See Comments)    Unknown  . Zolpidem     Other reaction(s): Other (See Comments) Unspecified reaction  . Amoxicillin-Pot Clavulanate Nausea And Vomiting  . Morphine Nausea Only    Family History  Problem Relation Age of Onset  . Bladder Cancer Neg Hx   . Kidney cancer Neg Hx   . Prolactinoma Neg Hx   . Prostate cancer Neg Hx     Social History:  reports that he has been smoking. He has a 50.00 pack-year smoking history. He has never used smokeless tobacco. He reports that he does not drink alcohol or use drugs.  ROS: A complete review of systems was performed.  All systems are negative except for pertinent findings as noted.  Physical Exam:  Vital signs in last 24 hours: Temp:  [97.8 F (36.6 C)] 97.8 F (36.6 C) (10/07 0913) Pulse Rate:  [92-105] 95 (10/07 1300) Resp:  [16-18] 18 (10/07 1230) BP: (124-181)/(87-113) 124/98 (10/07 1300) SpO2:  [95 %-98 %] 96 % (10/07 1300) Weight:  [77.1 kg] 77.1 kg (10/07 0910) Constitutional: Awake and alert HEENT: [place] AT, moist mucus membranes Cardiovascular: No clubbing, cyanosis, or edema Respiratory: Normal respiratory effort GI: Abdomen is soft, nontender, nondistended GU: 22Fr 3-way catheter in place draining grossly bloody urine, rapidly flowing CBI in place. Dried blood visible around the urethral meatus and inner thighs. Skin: No rashes, bruises or suspicious lesions Neurologic: Hand tremor Psychiatric: Normal mood and affect  Laboratory Data:  Recent Labs    04/21/19 0925  WBC 7.0  HGB 13.1  HCT 37.6*   Recent Labs    04/21/19 0925  NA 135  K 4.2  CL 101  CO2 25  GLUCOSE 118*  BUN 19  CREATININE 1.46*  CALCIUM 9.0   Urinalysis     Component Value Date/Time   COLORURINE RED (A) 04/21/2019 1111   APPEARANCEUR CLOUDY (A) 04/21/2019 1111   APPEARANCEUR Cloudy (A) 02/22/2019 1320   LABSPEC 1.032 (H) 04/21/2019 1111   LABSPEC 1.016 01/01/2013 1221   PHURINE  04/21/2019 1111    TEST NOT REPORTED DUE TO COLOR INTERFERENCE OF URINE PIGMENT   GLUCOSEU (A) 04/21/2019 1111    TEST NOT REPORTED DUE TO COLOR INTERFERENCE OF URINE PIGMENT   GLUCOSEU Negative 01/01/2013 1221   HGBUR (A) 04/21/2019 1111    TEST NOT REPORTED DUE TO COLOR INTERFERENCE OF URINE PIGMENT   BILIRUBINUR (A) 04/21/2019 1111    TEST NOT REPORTED DUE TO COLOR INTERFERENCE OF URINE PIGMENT   BILIRUBINUR Negative 02/22/2019 1320   BILIRUBINUR Negative 01/01/2013 1221   KETONESUR (A) 04/21/2019 1111    TEST NOT REPORTED DUE TO COLOR INTERFERENCE OF URINE PIGMENT   PROTEINUR (A) 04/21/2019 1111    TEST NOT REPORTED  DUE TO COLOR INTERFERENCE OF URINE PIGMENT   NITRITE (A) 04/21/2019 1111    TEST NOT REPORTED DUE TO COLOR INTERFERENCE OF URINE PIGMENT   LEUKOCYTESUR (A) 04/21/2019 1111    TEST NOT REPORTED DUE TO COLOR INTERFERENCE OF URINE PIGMENT   LEUKOCYTESUR Negative 01/01/2013 1221   Results for orders placed or performed during the hospital encounter of 04/21/19  SARS Coronavirus 2 [facility] order, Performed in [facility] hospital lab) Nasopharyngeal Nasopharyngeal Swab     Status: None   Collection Time: 04/21/19 12:35 PM   Specimen: Nasopharyngeal Swab  Result Value Ref Range Status   SARS Coronavirus 2 NEGATIVE NEGATIVE Final    Comment: (NOTE) If result is NEGATIVE SARS-CoV-2 target nucleic acids are NOT DETECTED. The SARS-CoV-2 RNA is generally detectable in upper and lower  respiratory specimens during the acute phase of infection. The lowest  concentration of SARS-CoV-2 viral copies this assay can detect is 250  copies / mL. A negative result does not preclude SARS-CoV-2 infection  and should not be used as the sole basis for treatment  or other  patient management decisions.  A negative result may occur with  improper specimen collection / handling, submission of specimen other  than nasopharyngeal swab, presence of viral mutation(s) within the  areas targeted by this assay, and inadequate number of viral copies  (<250 copies / mL). A negative result must be combined with clinical  observations, patient history, and epidemiological information. If result is POSITIVE SARS-CoV-2 target nucleic acids are DETECTED. The SARS-CoV-2 RNA is generally detectable in upper and lower  respiratory specimens dur ing the acute phase of infection.  Positive  results are indicative of active infection with SARS-CoV-2.  Clinical  correlation with patient history and other diagnostic information is  necessary to determine patient infection status.  Positive results do  not rule out bacterial infection or co-infection with other viruses. If result is PRESUMPTIVE POSTIVE SARS-CoV-2 nucleic acids MAY BE PRESENT.   A presumptive positive result was obtained on the submitted specimen  and confirmed on repeat testing.  While 2019 novel coronavirus  (SARS-CoV-2) nucleic acids may be present in the submitted sample  additional confirmatory testing may be necessary for epidemiological  and / or clinical management purposes  to differentiate between  SARS-CoV-2 and other Sarbecovirus currently known to infect humans.  If clinically indicated additional testing with an alternate test  methodology [phone]) is advised. The SARS-CoV-2 RNA is generally  detectable in upper and lower respiratory sp ecimens during the acute  phase of infection. The expected result is Negative. Fact Sheet for Patients:  StrictlyIdeas.no Fact Sheet for Healthcare Providers: BankingDealers.co.za This test is not yet approved or cleared by the [place] FDA and has been authorized for detection and/or diagnosis of  SARS-CoV-2 by FDA under an Emergency Use Authorization (EUA).  This EUA will remain in effect (meaning this test can be used) for the duration of the COVID-19 declaration under Section 564(b)(1) of the Act, 21 U.S.C. section 360bbb-3(b)(1), unless the authorization is terminated or revoked sooner. Performed at [facility], [address] [place], [place] [address]     Radiologic Imaging: US Pelvis Limited (transabdominal Only)  Result Date: 04/21/2019 CLINICAL DATA:  Gross hematuria. EXAM: LIMITED ULTRASOUND OF PELVIS TECHNIQUE: Limited transabdominal ultrasound examination of the pelvis was performed. COMPARISON:  CT scan of same day. FINDINGS: Foley catheter is noted within urinary bladder. Complex fluid or debris is noted within the bladder lumen which may represent hemorrhage  or clots. Calculated prevoid volume of 205 mL. IMPRESSION: Foley catheter is noted within urinary bladder. Complex fluid or debris is noted within the bladder lumen which may represent hemorrhage or clots. Electronically Signed   By: Marijo Conception M.D.   On: 04/21/2019 14:18   Ct Renal Stone Study  Result Date: 04/21/2019 CLINICAL DATA:  Acute left flank pain, gross hematuria. EXAM: CT ABDOMEN AND PELVIS WITHOUT CONTRAST TECHNIQUE: Multidetector CT imaging of the abdomen and pelvis was performed following the standard protocol without IV contrast. COMPARISON:  CT scan of August 03, 2015. FINDINGS: Lower chest: No acute abnormality. Hepatobiliary: No focal liver abnormality is seen. No gallstones, gallbladder wall thickening, or biliary dilatation. Pancreas: Unremarkable. No pancreatic ductal dilatation or surrounding inflammatory changes. Spleen: Normal in size without focal abnormality. Adrenals/Urinary Tract: Adrenal glands appear normal. Status post right nephrectomy. Nonobstructive left nephrolithiasis is noted. Probable 14 mm hyperdense cyst is noted in upper pole of left kidney. No hydronephrosis or  renal obstruction is noted. Large amount of high density material is noted within the urinary bladder lumen most consistent with hemorrhage. Stomach/Bowel: Status post right colectomy. The stomach is unremarkable. There is no evidence of bowel obstruction or inflammation. Sigmoid diverticulosis is noted without inflammation. Vascular/Lymphatic: Aortic atherosclerosis. No enlarged abdominal or pelvic lymph nodes. Reproductive: Stable moderate prostatic enlargement is noted. Other: No abdominal wall hernia or abnormality. No abdominopelvic ascites. Musculoskeletal: Severe multilevel degenerative disc disease is noted in the lower lumbar spine. No acute abnormality is noted. IMPRESSION: Large amount of hemorrhage is noted within the urinary bladder lumen. Cystoscopy is recommended to evaluate for possible neoplasm. Nonobstructive left nephrolithiasis is noted. No hydronephrosis or renal obstruction is noted. Status post right nephrectomy. 14 mm rounded hyperdense abnormality is noted in upper pole of left kidney. This may simply represent hyperdense cyst, but renal ultrasound is recommended to rule out potential neoplasm. Moderate prostatic enlargement is again noted. Sigmoid diverticulosis without inflammation. Aortic Atherosclerosis (ICD10-I70.0). Electronically Signed   By: Marijo Conception M.D.   On: 04/21/2019 10:24   I personally reviewed the CT above and note the presence of a distended urinary bladder filled with debris consistent with clot retention.  Assessment & Plan:  Patient with acute presentation of clot retention with no obstructive nephrolithiasis per CT.  Rapid flow CBI started in the emergency department prior to urology consult.  Stopped CBI immediately upon seeing patient as clot had not yet been evacuated, thus increasing the risk of bladder rupture.  Removed 79 French three-way catheter and prepped patient in a sterile fashion with Betadine and 2% lidocaine jelly instilled into the urethra.   Placed a 46 [place] hematuria catheter and filled the balloon with 30 mL of sterile water.  We proceeded with bedside bladder irrigation, evacuating a total of 250 mL clot material from his urinary bladder.  Irrigation continued until return of clot material stopped and sequential [place] syringe irrigations became increasingly light in color.  Following irrigation, restarted CBI at slow drip.  Follow-up pelvic ultrasound revealed successful irrigation of the majority of clot material.  No surgical intervention required today.  Recommendations as below. -Hold aspirin -Continue slow drip CBI, flush as needed -Advance diet as tolerated -Plan to wean CBI in the morning  Thank you for involving me in this patient's care, I will continue to follow along.  Debroah Loop, PA-C 04/21/2019 3:06 PM

## 2019-04-22 ENCOUNTER — Ambulatory Visit: Payer: PPO

## 2019-04-22 ENCOUNTER — Ambulatory Visit: Payer: PPO | Admitting: Physician Assistant

## 2019-04-22 LAB — BASIC METABOLIC PANEL
Anion gap: 5 (ref 5–15)
BUN: 22 mg/dL (ref 8–23)
CO2: 27 mmol/L (ref 22–32)
Calcium: 7.9 mg/dL — ABNORMAL LOW (ref 8.9–10.3)
Chloride: 104 mmol/L (ref 98–111)
Creatinine, Ser: 1.63 mg/dL — ABNORMAL HIGH (ref 0.61–1.24)
GFR calc Af Amer: 46 mL/min — ABNORMAL LOW (ref >60)
GFR calc non Af Amer: 39 mL/min — ABNORMAL LOW (ref >60)
Glucose, Bld: 94 mg/dL (ref 70–99)
Potassium: 3.8 mmol/L (ref 3.5–5.1)
Sodium: 136 mmol/L (ref 135–145)

## 2019-04-22 LAB — CBC
HCT: 29.8 % — ABNORMAL LOW (ref 39.0–52.0)
Hemoglobin: 10.1 g/dL — ABNORMAL LOW (ref 13.0–17.0)
MCH: 32.6 pg (ref 26.0–34.0)
MCHC: 33.9 g/dL (ref 30.0–36.0)
MCV: 96.1 fL (ref 80.0–100.0)
Platelets: 177 10*3/uL (ref 150–400)
RBC: 3.1 MIL/uL — ABNORMAL LOW (ref 4.22–5.81)
RDW: 14.4 % (ref 11.5–15.5)
WBC: 8.2 10*3/uL (ref 4.0–10.5)
nRBC: 0 % (ref 0.0–0.2)

## 2019-04-22 MED ORDER — DONEPEZIL HCL 5 MG PO TABS
10.0000 mg | ORAL_TABLET | Freq: Every evening | ORAL | Status: DC
  Administered 2019-04-22 – 2019-04-24 (×3): 10 mg via ORAL
  Filled 2019-04-22 (×3): qty 2

## 2019-04-22 MED ORDER — TRAZODONE HCL 100 MG PO TABS
200.0000 mg | ORAL_TABLET | Freq: Every day | ORAL | Status: DC
  Administered 2019-04-22 – 2019-04-24 (×3): 200 mg via ORAL
  Filled 2019-04-22 (×4): qty 2

## 2019-04-22 MED ORDER — VITAMIN B-12 1000 MCG PO TABS
1000.0000 ug | ORAL_TABLET | Freq: Every day | ORAL | Status: DC
  Administered 2019-04-22 – 2019-04-24 (×3): 1000 ug via ORAL
  Filled 2019-04-22 (×3): qty 1

## 2019-04-22 MED ORDER — DUTASTERIDE 0.5 MG PO CAPS
0.5000 mg | ORAL_CAPSULE | Freq: Every day | ORAL | Status: DC
  Administered 2019-04-22 – 2019-04-25 (×4): 0.5 mg via ORAL
  Filled 2019-04-22 (×4): qty 1

## 2019-04-22 MED ORDER — DOXAZOSIN MESYLATE 4 MG PO TABS
4.0000 mg | ORAL_TABLET | Freq: Every day | ORAL | Status: DC
  Administered 2019-04-22 – 2019-04-25 (×4): 4 mg via ORAL
  Filled 2019-04-22 (×4): qty 1

## 2019-04-22 MED ORDER — ALBUTEROL SULFATE HFA 108 (90 BASE) MCG/ACT IN AERS: 2.0000 | INHALATION_SPRAY | Freq: Four times a day (QID) | RESPIRATORY_TRACT | Status: DC | PRN

## 2019-04-22 MED ORDER — CARBIDOPA-LEVODOPA ER 50-200 MG PO TBCR
1.0000 | EXTENDED_RELEASE_TABLET | Freq: Two times a day (BID) | ORAL | Status: DC
  Administered 2019-04-22 – 2019-04-25 (×7): 1 via ORAL
  Filled 2019-04-22 (×8): qty 1

## 2019-04-22 MED ORDER — LORATADINE 10 MG PO TABS
10.0000 mg | ORAL_TABLET | Freq: Every day | ORAL | Status: DC
  Administered 2019-04-22 – 2019-04-25 (×4): 10 mg via ORAL
  Filled 2019-04-22 (×4): qty 1

## 2019-04-22 MED ORDER — TAMSULOSIN HCL 0.4 MG PO CAPS
0.4000 mg | ORAL_CAPSULE | Freq: Every day | ORAL | Status: DC
  Administered 2019-04-22 – 2019-04-25 (×4): 0.4 mg via ORAL
  Filled 2019-04-22 (×4): qty 1

## 2019-04-22 MED ORDER — DILTIAZEM HCL ER COATED BEADS 240 MG PO CP24
240.0000 mg | ORAL_CAPSULE | Freq: Every day | ORAL | Status: DC
  Administered 2019-04-22 – 2019-04-25 (×4): 240 mg via ORAL
  Filled 2019-04-22 (×4): qty 1

## 2019-04-22 MED ORDER — CARBIDOPA-LEVODOPA 25-100 MG PO TABS
2.0000 | ORAL_TABLET | Freq: Three times a day (TID) | ORAL | Status: DC
  Administered 2019-04-22 – 2019-04-25 (×9): 2 via ORAL
  Filled 2019-04-22 (×12): qty 2

## 2019-04-22 MED ORDER — CHLORHEXIDINE GLUCONATE CLOTH 2 % EX PADS
6.0000 | MEDICATED_PAD | Freq: Every day | CUTANEOUS | Status: DC
  Administered 2019-04-22: 10:00:00 6 via TOPICAL

## 2019-04-22 MED ORDER — HALOPERIDOL LACTATE 5 MG/ML IJ SOLN
2.5000 mg | Freq: Four times a day (QID) | INTRAMUSCULAR | Status: DC | PRN
  Administered 2019-04-22: 2.5 mg via INTRAVENOUS
  Filled 2019-04-22: qty 1

## 2019-04-22 NOTE — Progress Notes (Signed)
Urology Inpatient Progress Note  Subjective: Russell Terry is a 78 y.o. male admitted yesterday with clot retention.  He was irrigated twice over the course of the day with evacuation of greater than 250 mL clot material.  Pelvic ultrasound with significant reduction in clot burden following initial irrigation.  Patient was on fast drip CBI overnight.  Wife and nursing staff both report light pink urinary output with fast drip CBI that rapidly returned to dark pink with titration.  Hemoglobin dropped to 10.1 overnight, down from 13.1 yesterday.  Creatinine up at 1.63 today, 1.46 yesterday.  Heart rate normal, mildly hypertensive this AM.  Patient reports improvement in pain today.  Holding aspirin, not on any other anticoagulants.  Current Facility-Administered Medications  Medication Dose Route Frequency Provider Last Rate Last Dose  . 0.9 %  sodium chloride infusion   Intravenous Continuous Demetrios Loll, MD 75 mL/hr at 04/22/19 [phone]    . acetaminophen (TYLENOL) tablet 650 mg  650 mg Oral Q6H PRN Demetrios Loll, MD       Or  . acetaminophen (TYLENOL) suppository 650 mg  650 mg Rectal Q6H PRN Demetrios Loll, MD      . albuterol (PROVENTIL) (2.5 MG/3ML) 0.083% nebulizer solution 2.5 mg  2.5 mg Nebulization Q2H PRN Demetrios Loll, MD      . bisacodyl (DULCOLAX) EC tablet 5 mg  5 mg Oral Daily PRN Demetrios Loll, MD      . carbidopa-levodopa (SINEMET CR) 50-200 MG per tablet controlled release 1 tablet  1 tablet Oral BID Henreitta Leber, MD      . carbidopa-levodopa (SINEMET IR) 25-100 MG per tablet immediate release 2 tablet  2 tablet Oral TID Henreitta Leber, MD      . Chlorhexidine Gluconate Cloth 2 % PADS 6 each  6 each Topical Daily Demetrios Loll, MD      . diltiazem (CARDIZEM CD) 24 hr capsule 240 mg  240 mg Oral Daily Sainani, Belia Heman, MD      . donepezil (ARICEPT) tablet 10 mg  10 mg Oral QPM Sainani, Belia Heman, MD      . doxazosin (CARDURA) tablet 4 mg  4 mg Oral Daily Sainani, Vivek J, MD      .  dutasteride (AVODART) capsule 0.5 mg  0.5 mg Oral Daily Henreitta Leber, MD      . HYDROcodone-acetaminophen (NORCO/VICODIN) 5-325 MG per tablet 1-2 tablet  1-2 tablet Oral Q4H PRN Demetrios Loll, MD   2 tablet at 04/22/19 [phone]  . loratadine (CLARITIN) tablet 10 mg  10 mg Oral Daily Henreitta Leber, MD      . metoprolol tartrate (LOPRESSOR) injection 2.5 mg  2.5 mg Intravenous Q4H PRN Demetrios Loll, MD   2.5 mg at 04/21/19 1541  . nicotine (NICODERM CQ - dosed in mg/24 hours) patch 14 mg  14 mg Transdermal Daily Epifanio Lesches, MD   14 mg at 04/21/19 2025  . ondansetron (ZOFRAN) tablet 4 mg  4 mg Oral Q6H PRN Demetrios Loll, MD       Or  . ondansetron [facility]) injection 4 mg  4 mg Intravenous Q6H PRN Demetrios Loll, MD      . opium-belladonna (B&O SUPPRETTES) 16.2-60 MG suppository 1 suppository  1 suppository Rectal Q8H PRN Hollice Espy, MD   1 suppository at 04/21/19 1729  . senna-docusate (Senokot-S) tablet 1 tablet  1 tablet Oral QHS PRN Demetrios Loll, MD      . sodium chloride irrigation 0.9 % 3,000  mL  3,000 mL Irrigation Continuous Hollice Espy, MD   3,000 mL at 04/21/19 1812  . tamsulosin (FLOMAX) capsule 0.4 mg  0.4 mg Oral Daily Henreitta Leber, MD      . traZODone (DESYREL) tablet 200 mg  200 mg Oral QHS Verdell Carmine, Belia Heman, MD      . vitamin B-12 (CYANOCOBALAMIN) tablet 1,000 mcg  1,000 mcg Oral QHS Henreitta Leber, MD        Objective: Vital signs in last 24 hours: Temp:  [97.8 F (36.6 C)-98.9 F (37.2 C)] 98.8 F (37.1 C) (10/08 0548) Pulse Rate:  [65-105] 65 (10/08 0548) Resp:  [16-18] 17 (10/08 0548) BP: (124-181)/(81-114) 132/95 (10/08 0548) SpO2:  [95 %-99 %] 97 % (10/08 0548) Weight:  [77.1 kg] 77.1 kg (10/07 0910)  Intake/Output from previous day: 10/07 0701 - 10/08 0700 In: [id number] [I.V.:729.4] Out: 57100 [Urine:57100] Intake/Output this shift: Total I/O In: 6000 [Other:6000] Out: 6500 [Urine:6500]  Physical Exam Vitals signs and nursing note reviewed.   Constitutional:      General: He is not in acute distress.    Appearance: He is not ill-appearing, toxic-appearing or diaphoretic.  HENT:     Head: Normocephalic and atraumatic.  Cardiovascular:     Rate and Rhythm: Normal rate.  Pulmonary:     Effort: Pulmonary effort is normal. No respiratory distress.  Abdominal:     General: Abdomen is flat. There is no distension.     Palpations: Abdomen is soft.     Tenderness: There is abdominal tenderness. There is no guarding or rebound.  Genitourinary:    Comments: 65 French hematuria catheter in place with CBI on Neurological:     Mental Status: He is alert.  Psychiatric:        Mood and Affect: Mood normal.        Behavior: Behavior normal.     Lab Results:  Recent Labs    04/21/19 0925 04/22/19 0631  WBC 7.0 8.2  HGB 13.1 10.1*  HCT 37.6* 29.8*  PLT 183 177   BMET Recent Labs    04/21/19 0925 04/22/19 0631  NA 135 136  K 4.2 3.8  CL 101 104  CO2 25 27  GLUCOSE 118* 94  BUN 19 22  CREATININE 1.46* 1.63*  CALCIUM 9.0 7.9*    Assessment & Plan: Patient with difficulty titrating CBI this morning following hand irrigation with clot evacuation x2 yesterday in the setting of clot retention.  Hemoglobin down since yesterday with known blood loss, however patient remains hemodynamically stable.  Patient underwent another round of hand irrigation this morning at the bedside.  A total of 500 cc sterile saline was irrigated into the bladder with return of 75 cc of clot material.  Urine was clear at the end of the procedure.  Additionally, 10 cc were added to the Foley balloon for a total of 40 cc in place.  Catheter was placed on slight tension to tamponade the prostate, which is believed to be the cause of the bleeding.  Patient tolerated intervention well.  There were no complications.  CBI was restarted at the end of the procedure and placed on slow drip with clear urinary output.  I believe the difficulty titrating CBI this  morning was secondary to retained clot material as opposed to continued bleeding.  I do not believe he will require surgical intervention today, he may continue normal diet.  We will continue to monitor the patient.  Recommendations as below.  -Hold  aspirin -Trend CBC and BMP -Continue CBI and titrate for light pink urinary output  Debroah Loop, PA-C 04/22/2019

## 2019-04-22 NOTE — Progress Notes (Signed)
This morning patients CBI Is still going and it runs pinkish clear but as soon as you slow it down it starts to get bright red and tries to clot again. Night nurse said it was a long night with it. Message Dr. Erlene Quan and Rosita Kea and gave them a update and they came to the floor to assess and treat patient.

## 2019-04-22 NOTE — Progress Notes (Signed)
[place] at [place] NAME: Russell Terry    MR#:  [id number]  DATE OF BIRTH:  [birth year]  SUBJECTIVE:   Patient presented to the hospital secondary to acute hematuria.  Continue CBI for now hemoglobin is stable and hematuria is improving.  Urology following.  Patient's wife is at bedside.  Patient does complain of some lower abdominal pain.  REVIEW OF SYSTEMS:    Review of Systems  Constitutional: Negative for chills and fever.  HENT: Negative for congestion and tinnitus.   Eyes: Negative for blurred vision and double vision.  Respiratory: Negative for cough, shortness of breath and wheezing.   Cardiovascular: Negative for chest pain, orthopnea and PND.  Gastrointestinal: Positive for abdominal pain (lower abdomen). Negative for diarrhea, nausea and vomiting.  Genitourinary: Negative for dysuria and hematuria.  Neurological: Negative for dizziness, sensory change and focal weakness.  All other systems reviewed and are negative.   Nutrition: Regular Tolerating Diet: Yes Tolerating PT: Await Eval.   DRUG ALLERGIES:   Allergies  Allergen Reactions  . Quinapril Cough  . Ambien [Zolpidem Tartrate] Other (See Comments)    Unspecified reaction  . Fenofibrate Other (See Comments)    Unknown  . Zolpidem     Other reaction(s): Other (See Comments) Unspecified reaction  . Amoxicillin-Pot Clavulanate Nausea And Vomiting  . Morphine Nausea Only    VITALS:  Blood pressure 110/78, pulse 70, temperature 98 F (36.7 C), temperature source Oral, resp. rate 18, height 5\' 10"  (1.778 m), weight 77.1 kg, SpO2 96 %.  PHYSICAL EXAMINATION:   Physical Exam  GENERAL:  78 y.o.-year-old patient lying in bed in no acute distress.  EYES: Pupils equal, round, reactive to light and accommodation. No scleral icterus. Extraocular muscles intact.  HEENT: Head atraumatic, normocephalic. Oropharynx and nasopharynx clear.  NECK:  Supple, no jugular  venous distention. No thyroid enlargement, no tenderness.  LUNGS: Normal breath sounds bilaterally, no wheezing, rales, rhonchi. No use of accessory muscles of respiration.  CARDIOVASCULAR: S1, S2 normal. No murmurs, rubs, or gallops.  ABDOMEN: Soft, nontender, nondistended. Bowel sounds present. No organomegaly or mass.  EXTREMITIES: No cyanosis, clubbing or edema b/l.    NEUROLOGIC: Cranial nerves II through XII are intact. No focal Motor or sensory deficits b/l.   PSYCHIATRIC: The patient is alert and oriented x 3.  SKIN: No obvious rash, lesion, or ulcer.   CBI in place with blood tinged urine draining.   LABORATORY PANEL:   CBC Recent Labs  Lab 04/22/19 0631  WBC 8.2  HGB 10.1*  HCT 29.8*  PLT 177   ------------------------------------------------------------------------------------------------------------------  Chemistries  Recent Labs  Lab 04/22/19 0631  NA 136  K 3.8  CL 104  CO2 27  GLUCOSE 94  BUN 22  CREATININE 1.63*  CALCIUM 7.9*   ------------------------------------------------------------------------------------------------------------------  Cardiac Enzymes No results for input(s): TROPONINI in the last 168 hours. ------------------------------------------------------------------------------------------------------------------  RADIOLOGY:  US Pelvis Limited (transabdominal Only)  Result Date: 04/21/2019 CLINICAL DATA:  Gross hematuria. EXAM: LIMITED ULTRASOUND OF PELVIS TECHNIQUE: Limited transabdominal ultrasound examination of the pelvis was performed. COMPARISON:  CT scan of same day. FINDINGS: Foley catheter is noted within urinary bladder. Complex fluid or debris is noted within the bladder lumen which may represent hemorrhage or clots. Calculated prevoid volume of 205 mL. IMPRESSION: Foley catheter is noted within urinary bladder. Complex fluid or debris is noted within the bladder lumen which may represent hemorrhage or clots. Electronically  Signed   By:  Marijo Conception M.D.   On: 04/21/2019 14:18   Ct Renal Stone Study  Result Date: 04/21/2019 CLINICAL DATA:  Acute left flank pain, gross hematuria. EXAM: CT ABDOMEN AND PELVIS WITHOUT CONTRAST TECHNIQUE: Multidetector CT imaging of the abdomen and pelvis was performed following the standard protocol without IV contrast. COMPARISON:  CT scan of August 03, 2015. FINDINGS: Lower chest: No acute abnormality. Hepatobiliary: No focal liver abnormality is seen. No gallstones, gallbladder wall thickening, or biliary dilatation. Pancreas: Unremarkable. No pancreatic ductal dilatation or surrounding inflammatory changes. Spleen: Normal in size without focal abnormality. Adrenals/Urinary Tract: Adrenal glands appear normal. Status post right nephrectomy. Nonobstructive left nephrolithiasis is noted. Probable 14 mm hyperdense cyst is noted in upper pole of left kidney. No hydronephrosis or renal obstruction is noted. Large amount of high density material is noted within the urinary bladder lumen most consistent with hemorrhage. Stomach/Bowel: Status post right colectomy. The stomach is unremarkable. There is no evidence of bowel obstruction or inflammation. Sigmoid diverticulosis is noted without inflammation. Vascular/Lymphatic: Aortic atherosclerosis. No enlarged abdominal or pelvic lymph nodes. Reproductive: Stable moderate prostatic enlargement is noted. Other: No abdominal wall hernia or abnormality. No abdominopelvic ascites. Musculoskeletal: Severe multilevel degenerative disc disease is noted in the lower lumbar spine. No acute abnormality is noted. IMPRESSION: Large amount of hemorrhage is noted within the urinary bladder lumen. Cystoscopy is recommended to evaluate for possible neoplasm. Nonobstructive left nephrolithiasis is noted. No hydronephrosis or renal obstruction is noted. Status post right nephrectomy. 14 mm rounded hyperdense abnormality is noted in upper pole of left kidney. This may  simply represent hyperdense cyst, but renal ultrasound is recommended to rule out potential neoplasm. Moderate prostatic enlargement is again noted. Sigmoid diverticulosis without inflammation. Aortic Atherosclerosis (ICD10-I70.0). Electronically Signed   By: Marijo Conception M.D.   On: 04/21/2019 10:24     ASSESSMENT AND PLAN:   78 year old male with past medical history of COPD, Parkinson's tremor, BPH, atrial fibrillation, hypertension, hyperlipidemia who presented to the hospital secondary to hematuria.  1.  Acute hematuria- secondary to underlying BPH and urinary retention. - Seen by urology and presently patient has a CBI.  The CBI was flushed today by urology and is draining well. Hemoglobin currently stable.  No plans for further surgical intervention presently.  Hold aspirin. - Continue Flomax, Avodart.  2.  BPH- chronic for the patient.  Currently has hematuria but hemoglobin stable.  Urology following. -Continue Flomax, Avodart.  3.  History of parkinsonian tremor- continue Sinemet.  4.  Dementia-continue Aricept.  5.  History of atrial fibrillation-rate controlled.  Continue Cardizem, hold aspirin given the hematuria.     All the records are reviewed and case discussed with Care Management/Social Worker. Management plans discussed with the patient, family and they are in agreement.  CODE STATUS: DNR  DVT Prophylaxis: Teds and SCDs  TOTAL TIME TAKING CARE OF THIS PATIENT: 30 minutes.   POSSIBLE D/C IN 2-3 DAYS, DEPENDING ON CLINICAL CONDITION.   Henreitta Leber M.D on 04/22/2019 at 12:34 PM  Between 7am to 6pm - Pager - [phone]  After 6pm go to [email or web address] - Proofreader  [facility]  Office  [phone]  CC: Primary care physician; [place] Pink, MD

## 2019-04-23 LAB — URINE CULTURE: Culture: 20000 — AB

## 2019-04-23 MED ORDER — DOCUSATE SODIUM 100 MG PO CAPS
100.0000 mg | ORAL_CAPSULE | Freq: Two times a day (BID) | ORAL | Status: DC
  Administered 2019-04-23 – 2019-04-24 (×3): 100 mg via ORAL
  Filled 2019-04-23 (×4): qty 1

## 2019-04-23 MED ORDER — SODIUM CHLORIDE 0.9 % IV SOLN
2.0000 g | INTRAVENOUS | Status: DC
  Administered 2019-04-23: 15:00:00 2 g via INTRAVENOUS
  Filled 2019-04-23: qty 2

## 2019-04-23 NOTE — Care Management Important Message (Signed)
Important Message  Patient Details  Name: Russell Terry MRN: [id number] Date of Birth: 1940-12-08   Medicare Important Message Given:  Yes     [name] 04/23/2019, 11:33 AM

## 2019-04-23 NOTE — TOC Initial Note (Signed)
Transition of Care [facility]) - Initial/Assessment Note    Patient Details  Name: Russell Terry MRN: [id number] Date of Birth: May 01, 1941  Transition of Care [facility]) CM/SW Contact:    Shelbie Hutching, RN Phone Number: 04/23/2019, 3:09 PM  Clinical Narrative:                 Patient admitted with hematuria, continuous bladder irrigation discontinued today.  Patient is from home with wife.  Wife reports that the patient has parkinson's and some dementia.  Patient is independent with ambulation but does have a walker, cane, and wheelchair at home.  Patient requires assistance with bathing and shaving and sometimes getting dressed, wife helps him with these things.  Wife reports that the patient also has trouble in the morning getting up out of the bed.  Wife requests a hospital bed at discharge.   Patient has had home health with [facility] in the past.  Wife would like to see how patient progresses in the hospital before deciding on home health services. RNCM will obtain order for hospital bed and contact [facility] with the referral.   RNCM will cont to monitor.   Expected Discharge Plan: [place] Barriers to Discharge: Continued Medical Work up   Patient Goals and CMS Choice   CMS Medicare.gov Compare Post Acute Care list provided to:: Patient Represenative (must comment) Choice offered to / list presented to : Spouse  Expected Discharge Plan and Services Expected Discharge Plan: [place]   Discharge Planning Services: CM Consult   Living arrangements for the past 2 months: Single Family Home                 DME Arranged: Hospital bed DME Agency: [employer] Date DME Agency Contacted: 04/23/19 Time DME Agency Contacted: 1502 Representative spoke with at DME Agency: E. [place] Agency: [place] ([place]) Date [place]: 04/23/19 Time [place]: [place] Representative spoke with at [place]: Floydene Flock  Prior Living Arrangements/Services Living arrangements for the past 2 months: [place] Lives with:: Spouse Patient language and need for interpreter reviewed:: No Do you feel safe going back to the place where you live?: Yes      Need for Family Participation in Patient Care: Yes (Comment)(Parkinson's and dementia) Care giver support system in place?: Yes (comment) Current home services: DME(cane, walker, wheelchair) Criminal Activity/Legal Involvement Pertinent to Current Situation/Hospitalization: No - Comment as needed  Activities of Daily Living Home Assistive Devices/Equipment: None ADL Screening (condition at time of admission) Patient's cognitive ability adequate to safely complete daily activities?: No Is the patient deaf or have difficulty hearing?: No Does the patient have difficulty seeing, even when wearing glasses/contacts?: No Does the patient have difficulty concentrating, remembering, or making decisions?: Yes Patient able to express need for assistance with ADLs?: Yes Does the patient have difficulty dressing or bathing?: Yes Independently performs ADLs?: No Communication: Needs assistance Is this a change from baseline?: Pre-admission baseline Dressing (OT): Needs assistance Is this a change from baseline?: Pre-admission baseline Grooming: Needs assistance Is this a change from baseline?: Pre-admission baseline Feeding: Independent Bathing: Needs assistance Is this a change from baseline?: Pre-admission baseline Toileting: Needs assistance Is this a change from baseline?: Pre-admission baseline In/Out Bed: Needs assistance Is this a change from baseline?: Pre-admission baseline Walks in Home: Independent Does the patient have difficulty walking or climbing stairs?: Yes Weakness of Legs: Both Weakness of Arms/Hands: None  Permission Sought/Granted Permission sought to share information with : Case Freight forwarder, Research scientist (medical) granted to share information with : Yes, Verbal Permission Granted     Permission granted to share info w AGENCY: [place] and [place] granted to share info w Relationship: Wife     Emotional Assessment Appearance:: Appears stated age Attitude/Demeanor/Rapport: Unable to Assess Affect (typically observed): Other (comment) Orientation: : Oriented to Self Alcohol / Substance Use: Not Applicable Psych Involvement: No (comment)  Admission diagnosis:  Urinary retention [R33.9] Gross hematuria [R31.0] Patient Active Problem List   Diagnosis Date Noted  . Hematuria 04/21/2019  . Urinary frequency 02/20/2019  . Urinary urgency 02/20/2019  . History of elevated PSA 02/20/2019  . History of renal cell carcinoma 02/20/2019  . UTI (urinary tract infection) 01/05/2019  . Personal history of kidney cancer 01/08/2018  . Hyperlipidemia, unspecified 07/09/2017  . Anxiety, generalized 10/08/2016  . Meningioma ([place]) 10/08/2016  . Insomnia, persistent 10/08/2016  . Parkinsonian features 10/08/2016  . Agitation 07/22/2016  . Encephalopathy acute 07/22/2016  . Essential hypertension 07/22/2016  . Pseudomonas urinary tract infection 07/22/2016  . Alzheimer's dementia without behavioral disturbance ([place]) 04/18/2016  . Neck pain 11/15/2015  . Lumbar stenosis with neurogenic claudication 10/18/2015  . Renal cyst, acquired, left 04/22/2015  . Pleuritic chest pain 05/31/2014  . History of rectal bleeding 01/11/2014  . History of kidney removal 12/22/2013  . DDD (degenerative disc disease), lumbar 12/13/2013  . Atrial fibrillation ([place]) 12/11/2013  . Bradycardia 12/11/2013  . COPD (chronic obstructive pulmonary disease) ([place]) 12/11/2013  . Nephrolithiasis 12/06/2013  . Other specified disorders of kidney and ureter 11/17/2013  . Benign prostatic hyperplasia with lower urinary tract symptoms 03/03/2013  . Elevated prostate specific antigen  (PSA) 03/03/2013  . Incomplete emptying of bladder 03/03/2013  . Nocturia 03/03/2013   PCP:  [place] Pink, MD Pharmacy:   [id number] - [place], [address] [place] [address] Phone: [phone] Fax: [phone]     Social Determinants of Health (SDOH) Interventions    Readmission Risk Interventions Readmission Risk Prevention Plan 01/08/2019  Post Dischage Appt Complete  Medication Screening Complete  Transportation Screening Complete  Some recent data might be hidden

## 2019-04-23 NOTE — Progress Notes (Signed)
[place] at [place] NAME: Russell Terry    MR#:  [id number]  DATE OF BIRTH:  Dec 10, 1940  SUBJECTIVE:   Pt. here due to hematuria and much improved and CBI was discontinued this afternoon.  Patient is a bit more confused this afternoon and has a low-grade fever.  REVIEW OF SYSTEMS:    Review of Systems  Constitutional: Positive for fever (low grade). Negative for chills.  HENT: Negative for congestion and tinnitus.   Eyes: Negative for blurred vision and double vision.  Respiratory: Negative for cough, shortness of breath and wheezing.   Cardiovascular: Negative for chest pain, orthopnea and PND.  Gastrointestinal: Negative for abdominal pain, diarrhea, nausea and vomiting.  Genitourinary: Negative for dysuria and hematuria.  Neurological: Negative for dizziness, sensory change and focal weakness.  All other systems reviewed and are negative.   Nutrition: Regular Tolerating Diet: Yes Tolerating PT: Await Eval.   DRUG ALLERGIES:   Allergies  Allergen Reactions  . Quinapril Cough  . Ambien [Zolpidem Tartrate] Other (See Comments)    Unspecified reaction  . Fenofibrate Other (See Comments)    Unknown  . Zolpidem     Other reaction(s): Other (See Comments) Unspecified reaction  . Amoxicillin-Pot Clavulanate Nausea And Vomiting  . Morphine Nausea Only    VITALS:  Blood pressure (!) 149/85, pulse 80, temperature (!) 100.6 F (38.1 C), temperature source Axillary, resp. rate 17, height 5\' 10"  (1.778 m), weight 77.1 kg, SpO2 97 %.  PHYSICAL EXAMINATION:   Physical Exam  GENERAL:  78 y.o.-year-old patient lying in bed in no acute distress.  EYES: Pupils equal, round, reactive to light and accommodation. No scleral icterus. Extraocular muscles intact.  HEENT: Head atraumatic, normocephalic. Oropharynx and nasopharynx clear.  NECK:  Supple, no jugular venous distention. No thyroid enlargement, no tenderness.  LUNGS: Normal  breath sounds bilaterally, no wheezing, rales, rhonchi. No use of accessory muscles of respiration.  CARDIOVASCULAR: S1, S2 normal. No murmurs, rubs, or gallops.  ABDOMEN: Soft, nontender, nondistended. Bowel sounds present. No organomegaly or mass.  EXTREMITIES: No cyanosis, clubbing or edema b/l.    NEUROLOGIC: Cranial nerves II through XII are intact. No focal Motor or sensory deficits b/l.   PSYCHIATRIC: The patient is alert and oriented x 2.  SKIN: No obvious rash, lesion, or ulcer.   CBI in place with clear yellow urine draining.   LABORATORY PANEL:   CBC Recent Labs  Lab 04/22/19 0631  WBC 8.2  HGB 10.1*  HCT 29.8*  PLT 177   ------------------------------------------------------------------------------------------------------------------  Chemistries  Recent Labs  Lab 04/22/19 0631  NA 136  K 3.8  CL 104  CO2 27  GLUCOSE 94  BUN 22  CREATININE 1.63*  CALCIUM 7.9*   ------------------------------------------------------------------------------------------------------------------  Cardiac Enzymes No results for input(s): TROPONINI in the last 168 hours. ------------------------------------------------------------------------------------------------------------------  RADIOLOGY:  No results found.   ASSESSMENT AND PLAN:   78 year old male with past medical history of COPD, Parkinson's tremor, BPH, atrial fibrillation, hypertension, hyperlipidemia who presented to the hospital secondary to hematuria.  1.  Acute hematuria- secondary to underlying BPH and urinary retention. -Seen by urology and had a CBI in place which was removed today.  Patient was having clear yellow urine drainage. -Continue Flomax, Avodart, follow postvoid residuals.  Hold aspirin.  No further intervention as per urology.  2.  Altered mental status- patient is a bit more confused this afternoon as per patient's wife.  This could relate to possible  underlying UTI with underlying  dementia. -Urine cultures only grew 20,000 colonies of Pseudomonas.  Given the fact the patient symptomatic, we will treat the patient with IV cefepime.  Follow mental status.  3.  BPH- chronic for the patient. Urology following and s/p CBI which was removed today.  -Continue Flomax, Avodart and follow post-void residuals.   4.  History of parkinsonian tremor- continue Sinemet.  5.  Dementia-continue Aricept.  6.  History of atrial fibrillation-rate controlled.  Continue Cardizem, hold aspirin given the hematuria.  7. UTI - urine cultures only growing 20,000 col of Pseudomonas.  - since pt. Has low grade fever and is symptomatic will treat with IV cefepime.    All the records are reviewed and case discussed with Care Management/Social Worker. Management plans discussed with the patient, family and they are in agreement.  CODE STATUS: DNR  DVT Prophylaxis: Teds and SCDs  TOTAL TIME TAKING CARE OF THIS PATIENT: 30 minutes.   POSSIBLE D/C IN 1-2 DAYS, DEPENDING ON CLINICAL CONDITION.   Henreitta Leber M.D on 04/23/2019 at 2:02 PM  Between 7am to 6pm - Pager - [phone]  After 6pm go to [email or web address] - Proofreader  [facility] [place]  Office  [phone]  CC: Primary care physician; [place] Pink, MD

## 2019-04-23 NOTE — Progress Notes (Signed)
Pharmacy Antibiotic Note  CHANCELLOR FINFROCK is a 78 y.o. male admitted on 04/21/2019 with UTI.  Pharmacy has been consulted for Cefepime dosing.   Plan: Cefepime 2 gm IV q24h   Height: 5\' 10"  (177.8 cm) Weight: 170 lb (77.1 kg) IBW/kg (Calculated) : 73  Temp (24hrs), Avg:99.2 F (37.3 C), Min:98.1 F (36.7 C), Max:100.6 F (38.1 C)  Recent Labs  Lab 04/21/19 0925 04/22/19 0631  WBC 7.0 8.2  CREATININE 1.46* 1.63*    Estimated Creatinine Clearance: 37.9 mL/min (A) (by C-G formula based on SCr of 1.63 mg/dL (H)).    Allergies  Allergen Reactions  . Quinapril Cough  . Ambien [Zolpidem Tartrate] Other (See Comments)    Unspecified reaction  . Fenofibrate Other (See Comments)    Unknown  . Zolpidem     Other reaction(s): Other (See Comments) Unspecified reaction  . Amoxicillin-Pot Clavulanate Nausea And Vomiting  . Morphine Nausea Only    Antimicrobials this admission: Cefepime  10/9 >>     Dose adjustments this admission:    Microbiology results:   BCx:   10/7 UCx: Pseudomonas aeruginosa 20,000    Sputum:      MRSA PCR:    Thank you for allowing pharmacy to be a part of this patient's care.  Brailyn Killion A 04/23/2019 2:35 PM

## 2019-04-23 NOTE — Progress Notes (Signed)
Patient has Alzheimer's dementia which requires patient's head and body to be positioned in ways not feasible with a normal bed.  Head must be elevated at least 30 degrees or more and patient requires frequent repositioning or the patient can develop shortness of breath and be at risk of aspiration.

## 2019-04-23 NOTE — Progress Notes (Signed)
Urology Inpatient Progress Note  Subjective: Russell Terry is a 78 y.o. male admitted on 04/21/2019 with clot retention, PMH prostatic bleeding requiring OR cystoscopy and prostatic fulguration in 2017.   CBI discontinued this morning with return of clear yellow urine.  No labs drawn today. Patient afebrile with hypertension to the 150s/90s.  Patient with increased confusion today, per wife.  Current Facility-Administered Medications  Medication Dose Route Frequency Provider Last Rate Last Dose  . 0.9 %  sodium chloride infusion   Intravenous Continuous Demetrios Loll, MD 75 mL/hr at 04/23/19 1115    . acetaminophen (TYLENOL) tablet 650 mg  650 mg Oral Q6H PRN Demetrios Loll, MD       Or  . acetaminophen (TYLENOL) suppository 650 mg  650 mg Rectal Q6H PRN Demetrios Loll, MD      . albuterol (PROVENTIL) (2.5 MG/3ML) 0.083% nebulizer solution 2.5 mg  2.5 mg Nebulization Q2H PRN Demetrios Loll, MD      . bisacodyl (DULCOLAX) EC tablet 5 mg  5 mg Oral Daily PRN Demetrios Loll, MD      . carbidopa-levodopa (SINEMET CR) 50-200 MG per tablet controlled release 1 tablet  1 tablet Oral BID Henreitta Leber, MD   1 tablet at 04/23/19 0841  . carbidopa-levodopa (SINEMET IR) 25-100 MG per tablet immediate release 2 tablet  2 tablet Oral TID Henreitta Leber, MD   2 tablet at 04/23/19 1116  . Chlorhexidine Gluconate Cloth 2 % PADS 6 each  6 each Topical Daily Demetrios Loll, MD   6 each at 04/22/19 1003  . diltiazem (CARDIZEM CD) 24 hr capsule 240 mg  240 mg Oral Daily Henreitta Leber, MD   240 mg at 04/23/19 0841  . donepezil (ARICEPT) tablet 10 mg  10 mg Oral QPM Henreitta Leber, MD   10 mg at 04/22/19 1715  . doxazosin (CARDURA) tablet 4 mg  4 mg Oral Daily Henreitta Leber, MD   4 mg at 04/23/19 0841  . dutasteride (AVODART) capsule 0.5 mg  0.5 mg Oral Daily Henreitta Leber, MD   0.5 mg at 04/23/19 0841  . haloperidol lactate (HALDOL) injection 2.5 mg  2.5 mg Intravenous Q6H PRN Henreitta Leber, MD   2.5 mg at  04/22/19 1522  . HYDROcodone-acetaminophen (NORCO/VICODIN) 5-325 MG per tablet 1-2 tablet  1-2 tablet Oral Q4H PRN Demetrios Loll, MD   2 tablet at 04/22/19 1716  . loratadine (CLARITIN) tablet 10 mg  10 mg Oral Daily Henreitta Leber, MD   10 mg at 04/23/19 0841  . metoprolol tartrate (LOPRESSOR) injection 2.5 mg  2.5 mg Intravenous Q4H PRN Demetrios Loll, MD   2.5 mg at 04/21/19 1541  . nicotine (NICODERM CQ - dosed in mg/24 hours) patch 14 mg  14 mg Transdermal Daily Epifanio Lesches, MD   14 mg at 04/23/19 0841  . ondansetron (ZOFRAN) tablet 4 mg  4 mg Oral Q6H PRN Demetrios Loll, MD       Or  . ondansetron [facility]) injection 4 mg  4 mg Intravenous Q6H PRN Demetrios Loll, MD      . opium-belladonna (B&O SUPPRETTES) 16.2-60 MG suppository 1 suppository  1 suppository Rectal Q8H PRN Hollice Espy, MD   1 suppository at 04/21/19 1729  . senna-docusate (Senokot-S) tablet 1 tablet  1 tablet Oral QHS PRN Demetrios Loll, MD      . sodium chloride irrigation 0.9 % 3,000 mL  3,000 mL Irrigation Continuous Hollice Espy, MD  Stopped at 04/23/19 0832  . tamsulosin (FLOMAX) capsule 0.4 mg  0.4 mg Oral Daily Henreitta Leber, MD   0.4 mg at 04/23/19 0841  . traZODone (DESYREL) tablet 200 mg  200 mg Oral QHS Henreitta Leber, MD   200 mg at 04/22/19 2142  . vitamin B-12 (CYANOCOBALAMIN) tablet 1,000 mcg  1,000 mcg Oral QHS Henreitta Leber, MD   1,000 mcg at 04/22/19 2142    Objective: Vital signs in last 24 hours: Temp:  [98.1 F (36.7 C)-99.7 F (37.6 C)] 98.4 F (36.9 C) (10/09 0838) Pulse Rate:  [68-80] 80 (10/09 0832) Resp:  [17-18] 17 (10/09 0832) BP: (110-155)/(70-99) 149/85 (10/09 0837) SpO2:  [96 %-98 %] 97 % (10/09 0832)  Intake/Output from previous day: 10/08 0701 - 10/09 0700 In: 8623.3 [P.O.:720; I.V.:1903.3] Out: [id number] [Urine:43150] Intake/Output this shift: No intake/output data recorded.  Physical Exam Vitals signs and nursing note reviewed.  Constitutional:      General: He is not in  acute distress.    Appearance: He is not ill-appearing, toxic-appearing or diaphoretic.  HENT:     Head: Normocephalic and atraumatic.  Abdominal:     General: Abdomen is flat. There is no distension.     Palpations: Abdomen is soft.     Tenderness: There is abdominal tenderness. There is no guarding or rebound.  Skin:    General: Skin is warm and dry.  Neurological:     Mental Status: He is easily aroused. He is disoriented.  Psychiatric:        Behavior: Behavior is agitated.        Cognition and Memory: Cognition is impaired.    Lab Results:  Recent Labs    04/21/19 0925 04/22/19 0631  WBC 7.0 8.2  HGB 13.1 10.1*  HCT 37.6* 29.8*  PLT 183 177   BMET Recent Labs    04/21/19 0925 04/22/19 0631  NA 135 136  K 4.2 3.8  CL 101 104  CO2 25 27  GLUCOSE 118* 94  BUN 19 22  CREATININE 1.46* 1.63*  CALCIUM 9.0 7.9*   Assessment & Plan: Patient with resolution of hematuria following clot evacuation and CBI in the setting of clot retention.  Can discontinue catheter today in advance of anticipated discharge.  Per wife, he is more confused today.  I suspect this is secondary to delirium in the setting of recent illness and hospitalization with PMH dementia associated with Parkinson's disease.  I performed catheter removal and fill and pull voiding trial today.  I filled his bladder with 157mL sterile saline, stopping with patient complaint of abdominal pain. He was noted to have a bladder spasm at that time; I occluded the end of the catheter to maximize fluid retention. 76mL of water was drained from the Foley balloon.  I then instilled 2% lidocaine jelly into his urethral catheter for ease of removal.  A 24FR hematuria foley cath was subsequently removed from the bladder; there was blood noted at the tip of the catheter and patient passed a large clot per urethral meatus alongside the catheter. Patient was cleaned and there was no additional blood passed per meatus at that time.  Advised patient and his wife to increase PO fluid intake to promote urine output. Nursing staff to complete voiding trial before discharge.  Recommend bladder scan prior to discharge.  If this is elevated, recommend follow-up pelvic ultrasound to investigate for possible recurrence of intravesicular bleeding, likely of prostatic source.  Debroah Loop, PA-C 04/23/2019

## 2019-04-24 LAB — CBC
HCT: 27.2 % — ABNORMAL LOW (ref 39.0–52.0)
Hemoglobin: 9.3 g/dL — ABNORMAL LOW (ref 13.0–17.0)
MCH: 32.3 pg (ref 26.0–34.0)
MCHC: 34.2 g/dL (ref 30.0–36.0)
MCV: 94.4 fL (ref 80.0–100.0)
Platelets: 162 10*3/uL (ref 150–400)
RBC: 2.88 MIL/uL — ABNORMAL LOW (ref 4.22–5.81)
RDW: 13.5 % (ref 11.5–15.5)
WBC: 10.3 10*3/uL (ref 4.0–10.5)
nRBC: 0 % (ref 0.0–0.2)

## 2019-04-24 LAB — BASIC METABOLIC PANEL
Anion gap: 5 (ref 5–15)
BUN: 11 mg/dL (ref 8–23)
CO2: 25 mmol/L (ref 22–32)
Calcium: 8.4 mg/dL — ABNORMAL LOW (ref 8.9–10.3)
Chloride: 106 mmol/L (ref 98–111)
Creatinine, Ser: 1.07 mg/dL (ref 0.61–1.24)
GFR calc Af Amer: >60 mL/min (ref >60)
GFR calc non Af Amer: >60 mL/min (ref >60)
Glucose, Bld: 121 mg/dL — ABNORMAL HIGH (ref 70–99)
Potassium: 3.4 mmol/L — ABNORMAL LOW (ref 3.5–5.1)
Sodium: 136 mmol/L (ref 135–145)

## 2019-04-24 LAB — MAGNESIUM: Magnesium: 1.9 mg/dL (ref 1.7–2.4)

## 2019-04-24 MED ORDER — SODIUM CHLORIDE 0.9 % IV SOLN
2.0000 g | Freq: Two times a day (BID) | INTRAVENOUS | Status: DC
  Administered 2019-04-24 – 2019-04-25 (×3): 2 g via INTRAVENOUS
  Filled 2019-04-24 (×4): qty 2

## 2019-04-24 MED ORDER — POTASSIUM CHLORIDE CRYS ER 20 MEQ PO TBCR
40.0000 meq | EXTENDED_RELEASE_TABLET | Freq: Once | ORAL | Status: AC
  Administered 2019-04-24: 40 meq via ORAL
  Filled 2019-04-24: qty 2

## 2019-04-24 NOTE — Progress Notes (Signed)
Pharmacy Antibiotic Note  Russell Terry is a 78 y.o. male admitted on 04/21/2019 with UTI.  Pharmacy has been consulted for Cefepime dosing.   Plan: Patient's renal function improving, will increase cefepime to 2g IV q12h per CrCl 30 - 60 ml/min and will continue to monitor.   Height: 5\' 10"  (177.8 cm) Weight: 170 lb (77.1 kg) IBW/kg (Calculated) : 73  Temp (24hrs), Avg:98.9 F (37.2 C), Min:98 F (36.7 C), Max:100.6 F (38.1 C)  Recent Labs  Lab 04/21/19 0925 04/22/19 0631 04/24/19 0429  WBC 7.0 8.2 10.3  CREATININE 1.46* 1.63* 1.07    Estimated Creatinine Clearance: 57.8 mL/min (by C-G formula based on SCr of 1.07 mg/dL).    Allergies  Allergen Reactions  . Quinapril Cough  . Ambien [Zolpidem Tartrate] Other (See Comments)    Unspecified reaction  . Fenofibrate Other (See Comments)    Unknown  . Zolpidem     Other reaction(s): Other (See Comments) Unspecified reaction  . Amoxicillin-Pot Clavulanate Nausea And Vomiting  . Morphine Nausea Only    Antimicrobials this admission: Cefepime  10/9 >>     Dose adjustments this admission:    Microbiology results:   BCx:   10/7 UCx: Pseudomonas aeruginosa 20,000    Sputum:      MRSA PCR:    Thank you for allowing pharmacy to be a part of this patient's care.  Tobie Lords, PharmD, BCPS Clinical Pharmacist 04/24/2019 7:04 AM

## 2019-04-24 NOTE — Progress Notes (Signed)
[facility] [place] NAME: Russell Terry    MR#:  [id number]  DATE OF BIRTH:  17-Nov-1940  SUBJECTIVE:   Pt. here due to hematuria and much improved and CBI was discontinued yesterday.  Patient reported to have been confused last night.  Was given a dose of Zofran due to nausea.  This morning patient still very sleepy.  Wife at bedside updated on treatment plans.  No fevers.  REVIEW OF SYSTEMS:    Unobtainable this morning due to patient being very sleepy.  DRUG ALLERGIES:   Allergies  Allergen Reactions  . Quinapril Cough  . Ambien [Zolpidem Tartrate] Other (See Comments)    Unspecified reaction  . Fenofibrate Other (See Comments)    Unknown  . Zolpidem     Other reaction(s): Other (See Comments) Unspecified reaction  . Amoxicillin-Pot Clavulanate Nausea And Vomiting  . Morphine Nausea Only    VITALS:  Blood pressure (!) 176/94, pulse 80, temperature 98.6 F (37 C), temperature source Oral, resp. rate 18, height 5\' 10"  (1.778 m), weight 77.1 kg, SpO2 98 %.  PHYSICAL EXAMINATION:   Physical Exam  GENERAL:  78 y.o.-year-old patient lying in bed in no acute distress.  EYES: Pupils equal, round, reactive to light and accommodation. No scleral icterus.  HEENT: Head atraumatic, normocephalic. Oropharynx and nasopharynx clear.  NECK:  Supple, no jugular venous distention. No thyroid enlargement, no tenderness.  LUNGS: Normal breath sounds bilaterally, no wheezing, rales, rhonchi. No use of accessory muscles of respiration.  CARDIOVASCULAR: S1, S2 normal. No murmurs, rubs, or gallops.  ABDOMEN: Soft, nontender, nondistended. Bowel sounds present. No organomegaly or mass.  EXTREMITIES: No cyanosis, clubbing or edema b/l.    NEUROLOGIC: Full neuro exam not done this morning due to patient being very sleepy. PSYCHIATRIC: Patient very sleepy this morning.  Was awake and agitated last night. SKIN: No obvious rash, lesion, or ulcer.    LABORATORY PANEL:   CBC Recent Labs  Lab 04/24/19 0429  WBC 10.3  HGB 9.3*  HCT 27.2*  PLT 162   ------------------------------------------------------------------------------------------------------------------  Chemistries  Recent Labs  Lab 04/24/19 0429  NA 136  K 3.4*  CL 106  CO2 25  GLUCOSE 121*  BUN 11  CREATININE 1.07  CALCIUM 8.4*  MG 1.9   ------------------------------------------------------------------------------------------------------------------  Cardiac Enzymes No results for input(s): TROPONINI in the last 168 hours. ------------------------------------------------------------------------------------------------------------------  RADIOLOGY:  No results found.   ASSESSMENT AND PLAN:   78 year old male with past medical history of COPD, Parkinson's tremor, BPH, atrial fibrillation, hypertension, hyperlipidemia who presented to the hospital secondary to hematuria.  1.  Acute hematuria- secondary to underlying BPH and urinary retention. ;  Resolved -Seen by urology and had a CBI in place which was removed on 04/23/2019.  -Continue Flomax, Avodart, follow postvoid residuals.  Hold aspirin.  No further intervention as per urology.  2.  Altered mental status- patient is a bit more confused yesterday as per patient's wife.  This could relate to possible underlying UTI with underlying dementia. -Urine cultures only grew 20,000 colonies of Pseudomonas.  Given the fact the patient symptomatic, patient was started on IV cefepime yesterday. Patient reported to have been very confused and agitated last night.  Had some nausea and was given Zofran.  This morning patient still very sleepy.  Wife reported patient does tend to have worsening confusion the longer he stays in the hospital.  Was asking about possible discharge on oral antibiotics. Based on  urine culture and sensitivities growing Pseudomonas, I discussed with pharmacy regarding oral antibiotics  option.  Decision was to let patient have at least 24 hours of IV antibiotics with cefepime.  Plans to give a dose of fosfomycin 3 g tomorrow with recommended treatment of 3g PO every 3 days x 3 doses.  3.  BPH- chronic for the patient. Urology following and s/p CBI which was removed previously .  -Continue Flomax, Avodart and follow post-void residuals.   4.  History of parkinsonian tremor- continue Sinemet.  5.  Dementia-continue Aricept.  6.  History of atrial fibrillation-rate controlled.  Continue Cardizem, hold aspirin given the hematuria.  7. UTI - urine cultures only growing 20,000 col of Pseudomonas.  - since pt. no fevers overnight.  Currently on IV cefepime.  Plan to transition to p.o. fosfomycin as mentioned above tomorrow.   DVT prophylaxis; SCDs No heparin products due to recent hematuria  All the records are reviewed and case discussed with Care Management/Social Worker. Management plans discussed with the patient, family and they are in agreement.  I updated wife present at bedside on treatment and discharge plans.  All questions answered.  CODE STATUS: DNR  DVT Prophylaxis: Teds and SCDs  TOTAL TIME TAKING CARE OF THIS PATIENT: 34 minutes.   POSSIBLE D/C IN 1-2 DAYS, DEPENDING ON CLINICAL CONDITION.   Clayson Riling M.D on 04/24/2019 at 11:02 AM  Between 7am to 6pm - Pager - [phone]  After 6pm go to [email or web address] - Proofreader  [facility]  Office  [phone]  CC: Primary care physician; [place] Pink, MD

## 2019-04-25 LAB — BASIC METABOLIC PANEL
Anion gap: 6 (ref 5–15)
BUN: 10 mg/dL (ref 8–23)
CO2: 24 mmol/L (ref 22–32)
Calcium: 8.3 mg/dL — ABNORMAL LOW (ref 8.9–10.3)
Chloride: 107 mmol/L (ref 98–111)
Creatinine, Ser: 1.04 mg/dL (ref 0.61–1.24)
GFR calc Af Amer: >60 mL/min (ref >60)
GFR calc non Af Amer: >60 mL/min (ref >60)
Glucose, Bld: 102 mg/dL — ABNORMAL HIGH (ref 70–99)
Potassium: 3.3 mmol/L — ABNORMAL LOW (ref 3.5–5.1)
Sodium: 137 mmol/L (ref 135–145)

## 2019-04-25 LAB — CBC
HCT: 26.3 % — ABNORMAL LOW (ref 39.0–52.0)
Hemoglobin: 9 g/dL — ABNORMAL LOW (ref 13.0–17.0)
MCH: 32.1 pg (ref 26.0–34.0)
MCHC: 34.2 g/dL (ref 30.0–36.0)
MCV: 93.9 fL (ref 80.0–100.0)
Platelets: 180 10*3/uL (ref 150–400)
RBC: 2.8 MIL/uL — ABNORMAL LOW (ref 4.22–5.81)
RDW: 13.5 % (ref 11.5–15.5)
WBC: 6.2 10*3/uL (ref 4.0–10.5)
nRBC: 0 % (ref 0.0–0.2)

## 2019-04-25 LAB — MAGNESIUM: Magnesium: 1.9 mg/dL (ref 1.7–2.4)

## 2019-04-25 MED ORDER — FOSFOMYCIN TROMETHAMINE 3 G PO PACK: 3.0000 g | PACK | ORAL | 0 refills | Status: AC

## 2019-04-25 MED ORDER — FOSFOMYCIN TROMETHAMINE 3 G PO PACK
3.0000 g | PACK | Freq: Once | ORAL | Status: AC
  Administered 2019-04-25: 3 g via ORAL
  Filled 2019-04-25: qty 3

## 2019-04-25 MED ORDER — POTASSIUM CHLORIDE CRYS ER 20 MEQ PO TBCR
40.0000 meq | EXTENDED_RELEASE_TABLET | Freq: Once | ORAL | Status: AC
  Administered 2019-04-25: 10:00:00 40 meq via ORAL
  Filled 2019-04-25: qty 2

## 2019-04-25 MED ORDER — BISACODYL 5 MG PO TBEC: 5.0000 mg | DELAYED_RELEASE_TABLET | Freq: Every day | ORAL | 0 refills | Status: DC | PRN

## 2019-04-25 MED ORDER — HYDRALAZINE HCL 20 MG/ML IJ SOLN
10.0000 mg | INTRAMUSCULAR | Status: AC
  Administered 2019-04-25: 12:00:00 10 mg via INTRAVENOUS
  Filled 2019-04-25: qty 1

## 2019-04-25 MED ORDER — ACETAMINOPHEN 325 MG PO TABS: 650.0000 mg | ORAL_TABLET | Freq: Four times a day (QID) | ORAL | 0 refills | Status: DC | PRN

## 2019-04-25 MED ORDER — ALUM & MAG HYDROXIDE-SIMETH 200-200-20 MG/5ML PO SUSP
30.0000 mL | Freq: Four times a day (QID) | ORAL | Status: DC | PRN
  Administered 2019-04-25: 04:00:00 30 mL via ORAL
  Filled 2019-04-25: qty 30

## 2019-04-25 NOTE — Discharge Instructions (Signed)
[facility]  Durable Medical Equipment - hospital bed to be delivered

## 2019-04-25 NOTE — TOC Progression Note (Signed)
Transition of Care [facility]) - Progression Note    Patient Details  Name: Russell Terry MRN: [id number] Date of Birth: [birth year]  Transition of Care [facility]) CM/SW Contact  [address], [place], [place] Phone Number: 04/25/2019, 4:56 PM  Clinical Narrative:    Patient discharged home today with spouse. [place]'s spouse chooses [facility]. Per patient's spouse, they have used them before. [place] ordered [place], spoke with [facility] [phone]   Hospital bed ordered using [employer] and will be delivered to patient's home. Referral completed, spoke with  Leroy [place] [phone]. Supporting documents faxed to [phone].   [place], LCSW Clinical Social Work [phone]    Expected Discharge Plan: [place] Barriers to Discharge: Continued Medical Work up  Expected Discharge Plan and Services Expected Discharge Plan: [place]   Discharge Planning Services: CM Consult   Living arrangements for the past 2 months: Single Family Home Expected Discharge Date: 04/25/19               DME Arranged: Hospital bed DME Agency: [employer] Date DME Agency Contacted: 04/23/19 Time DME Agency Contacted: 1502 Representative spoke with at DME Agency: [place]   [facility] Agency: [place] (Angie) Date [place]: 04/23/19 Time [place]: [place] Representative spoke with at [place]: [place] (SDOH) Interventions    Readmission Risk Interventions Readmission Risk Prevention Plan 01/08/2019  Post Dischage Appt Complete  Medication Screening Complete  Transportation Screening Complete  Some recent data might be hidden

## 2019-04-25 NOTE — Plan of Care (Signed)

## 2019-04-25 NOTE — Discharge Summary (Signed)
[facility] NAME: Russell Terry    MR#:  [id number]  DATE OF BIRTH:  [birth year]  DATE OF ADMISSION:  04/21/2019   ADMITTING PHYSICIAN: Demetrios Loll, MD  DATE OF DISCHARGE: 04/25/2019  PRIMARY CARE PHYSICIAN: Maryland Pink, MD   ADMISSION DIAGNOSIS:  Urinary retention [R33.9] Gross hematuria [R31.0] DISCHARGE DIAGNOSIS:  Active Problems:   Hematuria  SECONDARY DIAGNOSIS:   Past Medical History:  Diagnosis Date  . Atrial fibrillation ([place]) 12/11/2013  . BPH (benign prostatic hyperplasia)   . Bradycardia 12/11/2013  . Calculus of kidney 12/06/2013  . Cancer ([place])    rt kidney removed  . Chronic kidney disease    stones,rt kidney removed  . COPD (chronic obstructive pulmonary disease) ([place]) 12/11/2013  . DDD (degenerative disc disease), lumbar 12/13/2013  . Delirium   . Dementia ([place])   . Elevated prostate specific antigen (PSA) 03/03/2013  . Encephalopathy acute 07/22/2016  . Enlarged prostate with lower urinary tract symptoms (LUTS) 03/03/2013  . Essential hypertension 07/22/2016  . Hyperlipidemia, unspecified 07/09/2017  . Hypertension   . Meningioma ([place]) 10/08/2016  . Parkinsonian features 10/08/2016  . Pleuritic chest pain 05/31/2014  . Renal cell carcinoma ([place]) 05/04/2016  . Renal cyst, acquired, left 04/22/2015   HOSPITAL COURSE:  Chief complaint; hematuria  History of presenting complaint; Russell Terry  is a 78 y.o. male with a known history of A. fib, BPH, right renal carcinoma status post nephrectomy, COPD, CKD, hypertension, hyperlipidemia, Parkinson's and dementia who presented to the emergency room with complaints of hematuria.  Reported to have had p.o. blood ongoing since 1 day prior to admission.He denied any abdominal pain, nausea or vomiting, no fever, chills, dysuria.  ED physician discussed with Dr. Erlene Quan, started CBI and request admission.   Hospital course; 1.  Acute hematuria- secondary to  underlying BPH and urinary retention. ;  Resolved -Seen by urology and had a CBI in place which was removed on 04/23/2019. Continue Flomax, Avodart, Hold aspirin.  No further intervention as per urology.  No further recurrence of hematuria.  Clinically and hemodynamically stable.  2.  Altered mental status- patient is a bit more confused 2 days ago.  This could relate to possible underlying UTI with underlying dementia. Urine cultures only grew 20,000 colonies of Pseudomonas.  Given the fact the patient symptomatic, patient was started on IV cefepime.  This morning patient significantly improved.  Awake and alert with no evidence of any agitation.  Wife confirmed significant improvement in mentation as well.  Wife wants patient discharged home to complete treatment of UTI with oral antibiotics because patient gets more confused with more sundowning with longer hospital stay.  Based on urine culture and sensitivities growing Pseudomonas, I discussed with pharmacy regarding oral antibiotics option.  Decision was to treat with fosfomycin 3 g p.o. every 3 days for total of 3 days starting from today.   3.  BPH- chronic for the patient. Urology following and s/p CBI which was removed previously .  -Continue Flomax, Avodart and patient voiding well.  4.  History of parkinsonian tremor- continue Sinemet.  5.  Dementia-continue Aricept.  6.  History of atrial fibrillation-rate controlled.  Continue Cardizem, hold aspirin given the hematuria.  7. UTI - urine cultures only growing 20,000 col of Pseudomonas.  - since pt. no fevers .  Patient initially treated with IV cefepime.  Being discharged on fosfomycin after first dose today to complete treatment duration Patient clinically  and hemodynamically stable.  Wife wants patient discharged home today.  Case manager to set up home health with physical therapy.  8.  Hypertension Blood pressure elevated this morning prior to a.m. meds.  Nursing to administer  a.m. blood pressure meds and recheck blood pressure to ensure better controlled prior to discharge.  DISCHARGE CONDITIONS:  Stable CONSULTS OBTAINED:  Treatment Team:  Hollice Espy, MD DRUG ALLERGIES:   Allergies  Allergen Reactions  . Quinapril Cough  . Ambien [Zolpidem Tartrate] Other (See Comments)    Unspecified reaction  . Fenofibrate Other (See Comments)    Unknown  . Zolpidem     Other reaction(s): Other (See Comments) Unspecified reaction  . Amoxicillin-Pot Clavulanate Nausea And Vomiting  . Morphine Nausea Only   DISCHARGE MEDICATIONS:   Allergies as of 04/25/2019      Reactions   Quinapril Cough   Ambien [zolpidem Tartrate] Other (See Comments)   Unspecified reaction   Fenofibrate Other (See Comments)   Unknown   Zolpidem    Other reaction(s): Other (See Comments) Unspecified reaction   Amoxicillin-pot Clavulanate Nausea And Vomiting   Morphine Nausea Only      Medication List    STOP taking these medications   aspirin EC 81 MG tablet     TAKE these medications   acetaminophen 325 MG tablet Commonly known as: TYLENOL Take 2 tablets (650 mg total) by mouth every 6 (six) hours as needed for mild pain (or Fever >/= 101).   albuterol 108 (90 Base) MCG/ACT inhaler Commonly known as: VENTOLIN HFA Inhale 2 puffs into the lungs every 6 (six) hours as needed for wheezing or shortness of breath.   Benadryl Allergy 25 MG tablet Generic drug: diphenhydrAMINE Take 25 mg by mouth daily as needed (itching or allergies).   bisacodyl 5 MG EC tablet Commonly known as: DULCOLAX Take 1 tablet (5 mg total) by mouth daily as needed for moderate constipation.   carbidopa-levodopa 50-200 MG tablet Commonly known as: SINEMET CR Take 1 tablet by mouth 2 (two) times daily.   carbidopa-levodopa 25-100 MG tablet Commonly known as: SINEMET IR Take 2 tablets by mouth 3 (three) times daily.   cetirizine 10 MG tablet Commonly known as: ZYRTEC Take 10 mg by mouth  daily.   diltiazem 240 MG 24 hr capsule Commonly known as: CARDIZEM CD Take 240 mg by mouth daily.   donepezil 10 MG tablet Commonly known as: ARICEPT Take 10 mg by mouth every evening.   doxazosin 4 MG tablet Commonly known as: CARDURA Take 4 mg by mouth daily.   dutasteride 0.5 MG capsule Commonly known as: AVODART Take 0.5 mg by mouth daily.   fosfomycin 3 g Pack Commonly known as: MONUROL Take 3 g by mouth every 3 (three) days for 2 doses.   Melatonin 5 MG Tabs Take 5 mg by mouth at bedtime.   tamsulosin 0.4 MG Caps capsule Commonly known as: FLOMAX Take 0.4 mg by mouth 2 (two) times daily.   traZODone 100 MG tablet Commonly known as: DESYREL Take 200 mg by mouth at bedtime.   venlafaxine XR 75 MG 24 hr capsule Commonly known as: EFFEXOR-XR Take 75 mg by mouth at bedtime.   vitamin B-12 1000 MCG tablet Commonly known as: CYANOCOBALAMIN Take 1,000 mcg by mouth at bedtime.            Durable Medical Equipment  (From admission, onward)         Start     Ordered  04/23/19 1527  For home use only DME Hospital bed  Once    Question Answer Comment  Length of Need Lifetime   Patient has (list medical condition): Alzehimers dementia   The above medical condition requires: Patient requires the ability to reposition frequently   Head must be elevated greater than: 30 degrees   Bed type Semi-electric   Hoyer Lift Yes   Support Surface: Gel Overlay      04/23/19 1528           DISCHARGE INSTRUCTIONS:   DIET:  Cardiac diet DISCHARGE CONDITION:  Stable ACTIVITY:  Activity as tolerated OXYGEN:  Home Oxygen: No.  Oxygen Delivery: room air DISCHARGE LOCATION:  home   If you experience worsening of your admission symptoms, develop shortness of breath, life threatening emergency, suicidal or homicidal thoughts you must seek medical attention immediately by calling 911 or calling your MD immediately  if symptoms less severe.  You Must read complete  instructions/literature along with all the possible adverse reactions/side effects for all the Medicines you take and that have been prescribed to you. Take any new Medicines after you have completely understood and accpet all the possible adverse reactions/side effects.   Please note  You were cared for by a hospitalist during your hospital stay. If you have any questions about your discharge medications or the care you received while you were in the hospital after you are discharged, you can call the unit and asked to speak with the hospitalist on call if the hospitalist that took care of you is not available. Once you are discharged, your primary care physician will handle any further medical issues. Please note that NO REFILLS for any discharge medications will be authorized once you are discharged, as it is imperative that you return to your primary care physician (or establish a relationship with a primary care physician if you do not have one) for your aftercare needs so that they can reassess your need for medications and monitor your lab values.    On the day of Discharge:  VITAL SIGNS:  Blood pressure (!) 185/92, pulse 67, temperature 98.9 F (37.2 C), temperature source Oral, resp. rate 16, height 5\' 10"  (1.778 m), weight 77.1 kg, SpO2 97 %. PHYSICAL EXAMINATION:  GENERAL:  78 y.o.-year-old patient lying in the bed with no acute distress.  EYES: Pupils equal, round, reactive to light and accommodation. No scleral icterus.  HEENT: Head atraumatic, normocephalic. Oropharynx and nasopharynx clear.  NECK:  Supple, no jugular venous distention. No thyroid enlargement, no tenderness.  LUNGS: Normal breath sounds bilaterally, no wheezing, rales,rhonchi or crepitation. No use of accessory muscles of respiration.  CARDIOVASCULAR: S1, S2 normal. No murmurs, rubs, or gallops.  ABDOMEN: Soft, non-tender, non-distended. Bowel sounds present. No organomegaly or mass.  EXTREMITIES: No pedal edema,  cyanosis, or clubbing.  NEUROLOGIC: Generalized weakness with no focal deficit. Gait not checked.  PSYCHIATRIC: The patient is alert and oriented x 3.  SKIN: No obvious rash, lesion, or ulcer.  DATA REVIEW:   CBC Recent Labs  Lab 04/25/19 0512  WBC 6.2  HGB 9.0*  HCT 26.3*  PLT 180    Chemistries  Recent Labs  Lab 04/25/19 0512  NA 137  K 3.3*  CL 107  CO2 24  GLUCOSE 102*  BUN 10  CREATININE 1.04  CALCIUM 8.3*  MG 1.9     Microbiology Results  Results for orders placed or performed during the hospital encounter of 04/21/19  Urine culture  Status: Abnormal   Collection Time: 04/21/19 11:11 AM   Specimen: Urine, Random  Result Value Ref Range Status   Specimen Description   Final    URINE, RANDOM Performed at [facility], [address] [place], [place] [address]    Special Requests   Final    NONE Performed at [facility], [place], [place] [address]    Culture 20,000 COLONIES/mL PSEUDOMONAS AERUGINOSA (A)  Final   Report Status 04/23/2019 FINAL  Final   Organism ID, Bacteria PSEUDOMONAS AERUGINOSA (A)  Final      Susceptibility   Pseudomonas aeruginosa - MIC*    CEFTAZIDIME 4 SENSITIVE Sensitive     CIPROFLOXACIN 2 INTERMEDIATE Intermediate     GENTAMICIN <=1 SENSITIVE Sensitive     IMIPENEM 2 SENSITIVE Sensitive     PIP/TAZO 8 SENSITIVE Sensitive     CEFEPIME 2 SENSITIVE Sensitive     * 20,000 COLONIES/mL PSEUDOMONAS AERUGINOSA  SARS Coronavirus 2 [facility] order, Performed in [facility] hospital lab) Nasopharyngeal Nasopharyngeal Swab     Status: None   Collection Time: 04/21/19 12:35 PM   Specimen: Nasopharyngeal Swab  Result Value Ref Range Status   SARS Coronavirus 2 NEGATIVE NEGATIVE Final    Comment: (NOTE) If result is NEGATIVE SARS-CoV-2 target nucleic acids are NOT DETECTED. The SARS-CoV-2 RNA is generally detectable in upper and lower  respiratory specimens during the acute phase of infection. The  lowest  concentration of SARS-CoV-2 viral copies this assay can detect is 250  copies / mL. A negative result does not preclude SARS-CoV-2 infection  and should not be used as the sole basis for treatment or other  patient management decisions.  A negative result may occur with  improper specimen collection / handling, submission of specimen other  than nasopharyngeal swab, presence of viral mutation(s) within the  areas targeted by this assay, and inadequate number of viral copies  (<250 copies / mL). A negative result must be combined with clinical  observations, patient history, and epidemiological information. If result is POSITIVE SARS-CoV-2 target nucleic acids are DETECTED. The SARS-CoV-2 RNA is generally detectable in upper and lower  respiratory specimens dur ing the acute phase of infection.  Positive  results are indicative of active infection with SARS-CoV-2.  Clinical  correlation with patient history and other diagnostic information is  necessary to determine patient infection status.  Positive results do  not rule out bacterial infection or co-infection with other viruses. If result is PRESUMPTIVE POSTIVE SARS-CoV-2 nucleic acids MAY BE PRESENT.   A presumptive positive result was obtained on the submitted specimen  and confirmed on repeat testing.  While 2019 novel coronavirus  (SARS-CoV-2) nucleic acids may be present in the submitted sample  additional confirmatory testing may be necessary for epidemiological  and / or clinical management purposes  to differentiate between  SARS-CoV-2 and other Sarbecovirus currently known to infect humans.  If clinically indicated additional testing with an alternate test  methodology [phone]) is advised. The SARS-CoV-2 RNA is generally  detectable in upper and lower respiratory sp ecimens during the acute  phase of infection. The expected result is Negative. Fact Sheet for Patients:   StrictlyIdeas.no Fact Sheet for Healthcare Providers: BankingDealers.co.za This test is not yet approved or cleared by the [place] FDA and has been authorized for detection and/or diagnosis of SARS-CoV-2 by FDA under an Emergency Use Authorization (EUA).  This EUA will remain in effect (meaning this test can be used) for  the duration of the COVID-19 declaration under Section 564(b)(1) of the Act, 21 U.S.C. section 360bbb-3(b)(1), unless the authorization is terminated or revoked sooner. Performed at [facility], [address] [place], [place] [address]     RADIOLOGY:  No results found.   Management plans discussed with the patient, family and they are in agreement.  CODE STATUS: DNR   TOTAL TIME TAKING CARE OF THIS PATIENT: 36 minutes.    Autumne Kallio M.D on 04/25/2019 at 10:31 AM  Between 7am to 6pm - Pager - [phone]  After 6pm go to [email or web address] - Proofreader  [facility]  Office  [phone]  CC: Primary care physician; Maryland Pink, MD   Note: This dictation was prepared with Dragon dictation along with smaller phrase technology. Any transcriptional errors that result from this process are unintentional.

## 2019-04-25 NOTE — Progress Notes (Signed)
MD order received in [facility] to discharge pt home with home health; Care Management previously established home health physical therapy with [facility] and DME - hospital bed to be delivered to the pt's home; verbally reviewed AVS with pt and his spouse, Cartez Tritz; gave Rxs to the pt; no questions voiced at this time; pt discharged via wheelchair by nursing to the [facility] entrance.

## 2019-04-27 DIAGNOSIS — N179 Acute kidney failure, unspecified: Secondary | ICD-10-CM | POA: Diagnosis not present

## 2019-04-27 DIAGNOSIS — N200 Calculus of kidney: Secondary | ICD-10-CM | POA: Diagnosis not present

## 2019-04-27 DIAGNOSIS — J449 Chronic obstructive pulmonary disease, unspecified: Secondary | ICD-10-CM | POA: Diagnosis not present

## 2019-04-27 DIAGNOSIS — N390 Urinary tract infection, site not specified: Secondary | ICD-10-CM | POA: Diagnosis not present

## 2019-04-27 DIAGNOSIS — F0280 Dementia in other diseases classified elsewhere without behavioral disturbance: Secondary | ICD-10-CM | POA: Diagnosis not present

## 2019-04-27 DIAGNOSIS — R310 Gross hematuria: Secondary | ICD-10-CM | POA: Diagnosis not present

## 2019-04-27 DIAGNOSIS — G20 Parkinson's disease: Secondary | ICD-10-CM | POA: Diagnosis not present

## 2019-04-27 DIAGNOSIS — N189 Chronic kidney disease, unspecified: Secondary | ICD-10-CM | POA: Diagnosis not present

## 2019-04-27 DIAGNOSIS — N401 Enlarged prostate with lower urinary tract symptoms: Secondary | ICD-10-CM | POA: Diagnosis not present

## 2019-04-27 DIAGNOSIS — B965 Pseudomonas (aeruginosa) (mallei) (pseudomallei) as the cause of diseases classified elsewhere: Secondary | ICD-10-CM | POA: Diagnosis not present

## 2019-04-27 DIAGNOSIS — I482 Chronic atrial fibrillation, unspecified: Secondary | ICD-10-CM | POA: Diagnosis not present

## 2019-04-27 DIAGNOSIS — R338 Other retention of urine: Secondary | ICD-10-CM | POA: Diagnosis not present

## 2019-04-27 DIAGNOSIS — I129 Hypertensive chronic kidney disease with stage 1 through stage 4 chronic kidney disease, or unspecified chronic kidney disease: Secondary | ICD-10-CM | POA: Diagnosis not present

## 2019-04-28 DIAGNOSIS — F0390 Unspecified dementia without behavioral disturbance: Secondary | ICD-10-CM | POA: Diagnosis not present

## 2019-05-04 DIAGNOSIS — R319 Hematuria, unspecified: Secondary | ICD-10-CM | POA: Diagnosis not present

## 2019-05-04 DIAGNOSIS — D649 Anemia, unspecified: Secondary | ICD-10-CM | POA: Diagnosis not present

## 2019-05-04 DIAGNOSIS — I480 Paroxysmal atrial fibrillation: Secondary | ICD-10-CM | POA: Diagnosis not present

## 2019-05-16 DIAGNOSIS — N189 Chronic kidney disease, unspecified: Secondary | ICD-10-CM | POA: Diagnosis not present

## 2019-05-16 DIAGNOSIS — N390 Urinary tract infection, site not specified: Secondary | ICD-10-CM | POA: Diagnosis not present

## 2019-05-16 DIAGNOSIS — I482 Chronic atrial fibrillation, unspecified: Secondary | ICD-10-CM | POA: Diagnosis not present

## 2019-05-16 DIAGNOSIS — G20 Parkinson's disease: Secondary | ICD-10-CM | POA: Diagnosis not present

## 2019-05-16 DIAGNOSIS — N401 Enlarged prostate with lower urinary tract symptoms: Secondary | ICD-10-CM | POA: Diagnosis not present

## 2019-05-16 DIAGNOSIS — F0280 Dementia in other diseases classified elsewhere without behavioral disturbance: Secondary | ICD-10-CM | POA: Diagnosis not present

## 2019-05-16 DIAGNOSIS — J449 Chronic obstructive pulmonary disease, unspecified: Secondary | ICD-10-CM | POA: Diagnosis not present

## 2019-05-16 DIAGNOSIS — R338 Other retention of urine: Secondary | ICD-10-CM | POA: Diagnosis not present

## 2019-05-16 DIAGNOSIS — B965 Pseudomonas (aeruginosa) (mallei) (pseudomallei) as the cause of diseases classified elsewhere: Secondary | ICD-10-CM | POA: Diagnosis not present

## 2019-05-16 DIAGNOSIS — N200 Calculus of kidney: Secondary | ICD-10-CM | POA: Diagnosis not present

## 2019-05-16 DIAGNOSIS — R310 Gross hematuria: Secondary | ICD-10-CM | POA: Diagnosis not present

## 2019-05-16 DIAGNOSIS — I129 Hypertensive chronic kidney disease with stage 1 through stage 4 chronic kidney disease, or unspecified chronic kidney disease: Secondary | ICD-10-CM | POA: Diagnosis not present

## 2019-05-16 DIAGNOSIS — N179 Acute kidney failure, unspecified: Secondary | ICD-10-CM | POA: Diagnosis not present

## 2019-05-21 DIAGNOSIS — M5136 Other intervertebral disc degeneration, lumbar region: Secondary | ICD-10-CM | POA: Diagnosis not present

## 2019-05-21 DIAGNOSIS — M5416 Radiculopathy, lumbar region: Secondary | ICD-10-CM | POA: Diagnosis not present

## 2019-05-21 DIAGNOSIS — M48062 Spinal stenosis, lumbar region with neurogenic claudication: Secondary | ICD-10-CM | POA: Diagnosis not present

## 2019-05-29 DIAGNOSIS — F0390 Unspecified dementia without behavioral disturbance: Secondary | ICD-10-CM | POA: Diagnosis not present

## 2019-06-17 DIAGNOSIS — M5416 Radiculopathy, lumbar region: Secondary | ICD-10-CM | POA: Diagnosis not present

## 2019-06-17 DIAGNOSIS — M48062 Spinal stenosis, lumbar region with neurogenic claudication: Secondary | ICD-10-CM | POA: Diagnosis not present

## 2019-06-17 DIAGNOSIS — M5136 Other intervertebral disc degeneration, lumbar region: Secondary | ICD-10-CM | POA: Diagnosis not present

## 2019-06-28 DIAGNOSIS — F0390 Unspecified dementia without behavioral disturbance: Secondary | ICD-10-CM | POA: Diagnosis not present

## 2019-07-13 DIAGNOSIS — I480 Paroxysmal atrial fibrillation: Secondary | ICD-10-CM | POA: Diagnosis not present

## 2019-07-13 DIAGNOSIS — J439 Emphysema, unspecified: Secondary | ICD-10-CM | POA: Diagnosis not present

## 2019-07-13 DIAGNOSIS — F17209 Nicotine dependence, unspecified, with unspecified nicotine-induced disorders: Secondary | ICD-10-CM | POA: Diagnosis not present

## 2019-07-13 DIAGNOSIS — E785 Hyperlipidemia, unspecified: Secondary | ICD-10-CM | POA: Diagnosis not present

## 2019-07-13 DIAGNOSIS — I10 Essential (primary) hypertension: Secondary | ICD-10-CM | POA: Diagnosis not present

## 2019-07-19 DIAGNOSIS — F0280 Dementia in other diseases classified elsewhere without behavioral disturbance: Secondary | ICD-10-CM | POA: Diagnosis not present

## 2019-07-19 DIAGNOSIS — N390 Urinary tract infection, site not specified: Secondary | ICD-10-CM | POA: Diagnosis not present

## 2019-07-19 DIAGNOSIS — R259 Unspecified abnormal involuntary movements: Secondary | ICD-10-CM | POA: Diagnosis not present

## 2019-07-19 DIAGNOSIS — G301 Alzheimer's disease with late onset: Secondary | ICD-10-CM | POA: Diagnosis not present

## 2019-07-19 DIAGNOSIS — D649 Anemia, unspecified: Secondary | ICD-10-CM | POA: Diagnosis not present

## 2019-07-19 DIAGNOSIS — G4700 Insomnia, unspecified: Secondary | ICD-10-CM | POA: Diagnosis not present

## 2019-07-19 DIAGNOSIS — I10 Essential (primary) hypertension: Secondary | ICD-10-CM | POA: Diagnosis not present

## 2019-07-19 DIAGNOSIS — J439 Emphysema, unspecified: Secondary | ICD-10-CM | POA: Diagnosis not present

## 2019-07-23 DIAGNOSIS — N390 Urinary tract infection, site not specified: Secondary | ICD-10-CM | POA: Diagnosis not present

## 2019-07-29 DIAGNOSIS — F0390 Unspecified dementia without behavioral disturbance: Secondary | ICD-10-CM | POA: Diagnosis not present

## 2019-07-30 DIAGNOSIS — M48062 Spinal stenosis, lumbar region with neurogenic claudication: Secondary | ICD-10-CM | POA: Diagnosis not present

## 2019-07-30 DIAGNOSIS — M5136 Other intervertebral disc degeneration, lumbar region: Secondary | ICD-10-CM | POA: Diagnosis not present

## 2019-07-30 DIAGNOSIS — M5416 Radiculopathy, lumbar region: Secondary | ICD-10-CM | POA: Diagnosis not present

## 2019-08-10 DIAGNOSIS — N401 Enlarged prostate with lower urinary tract symptoms: Secondary | ICD-10-CM | POA: Diagnosis not present

## 2019-08-10 DIAGNOSIS — F17210 Nicotine dependence, cigarettes, uncomplicated: Secondary | ICD-10-CM | POA: Diagnosis not present

## 2019-08-10 DIAGNOSIS — G20 Parkinson's disease: Secondary | ICD-10-CM | POA: Diagnosis not present

## 2019-08-10 DIAGNOSIS — M4807 Spinal stenosis, lumbosacral region: Secondary | ICD-10-CM | POA: Diagnosis not present

## 2019-08-10 DIAGNOSIS — Z86010 Personal history of colonic polyps: Secondary | ICD-10-CM | POA: Diagnosis not present

## 2019-08-10 DIAGNOSIS — Z86011 Personal history of benign neoplasm of the brain: Secondary | ICD-10-CM | POA: Diagnosis not present

## 2019-08-10 DIAGNOSIS — N3941 Urge incontinence: Secondary | ICD-10-CM | POA: Diagnosis not present

## 2019-08-10 DIAGNOSIS — Z85528 Personal history of other malignant neoplasm of kidney: Secondary | ICD-10-CM | POA: Diagnosis not present

## 2019-08-10 DIAGNOSIS — Z905 Acquired absence of kidney: Secondary | ICD-10-CM | POA: Diagnosis not present

## 2019-08-10 DIAGNOSIS — J449 Chronic obstructive pulmonary disease, unspecified: Secondary | ICD-10-CM | POA: Diagnosis not present

## 2019-08-10 DIAGNOSIS — F0280 Dementia in other diseases classified elsewhere without behavioral disturbance: Secondary | ICD-10-CM | POA: Diagnosis not present

## 2019-08-10 DIAGNOSIS — I10 Essential (primary) hypertension: Secondary | ICD-10-CM | POA: Diagnosis not present

## 2019-08-10 DIAGNOSIS — E785 Hyperlipidemia, unspecified: Secondary | ICD-10-CM | POA: Diagnosis not present

## 2019-08-10 DIAGNOSIS — Z9181 History of falling: Secondary | ICD-10-CM | POA: Diagnosis not present

## 2019-08-10 DIAGNOSIS — I4891 Unspecified atrial fibrillation: Secondary | ICD-10-CM | POA: Diagnosis not present

## 2019-08-10 DIAGNOSIS — M4727 Other spondylosis with radiculopathy, lumbosacral region: Secondary | ICD-10-CM | POA: Diagnosis not present

## 2019-08-10 DIAGNOSIS — M5417 Radiculopathy, lumbosacral region: Secondary | ICD-10-CM | POA: Diagnosis not present

## 2019-08-13 DIAGNOSIS — F0280 Dementia in other diseases classified elsewhere without behavioral disturbance: Secondary | ICD-10-CM | POA: Diagnosis not present

## 2019-08-13 DIAGNOSIS — G308 Other Alzheimer's disease: Secondary | ICD-10-CM | POA: Diagnosis not present

## 2019-08-13 DIAGNOSIS — G20 Parkinson's disease: Secondary | ICD-10-CM | POA: Diagnosis not present

## 2019-08-13 DIAGNOSIS — G4700 Insomnia, unspecified: Secondary | ICD-10-CM | POA: Diagnosis not present

## 2019-08-17 DIAGNOSIS — Z85528 Personal history of other malignant neoplasm of kidney: Secondary | ICD-10-CM | POA: Diagnosis not present

## 2019-08-17 DIAGNOSIS — J449 Chronic obstructive pulmonary disease, unspecified: Secondary | ICD-10-CM | POA: Diagnosis not present

## 2019-08-17 DIAGNOSIS — Z86011 Personal history of benign neoplasm of the brain: Secondary | ICD-10-CM | POA: Diagnosis not present

## 2019-08-17 DIAGNOSIS — N3941 Urge incontinence: Secondary | ICD-10-CM | POA: Diagnosis not present

## 2019-08-17 DIAGNOSIS — M4727 Other spondylosis with radiculopathy, lumbosacral region: Secondary | ICD-10-CM | POA: Diagnosis not present

## 2019-08-17 DIAGNOSIS — Z86010 Personal history of colonic polyps: Secondary | ICD-10-CM | POA: Diagnosis not present

## 2019-08-17 DIAGNOSIS — F0280 Dementia in other diseases classified elsewhere without behavioral disturbance: Secondary | ICD-10-CM | POA: Diagnosis not present

## 2019-08-17 DIAGNOSIS — M4807 Spinal stenosis, lumbosacral region: Secondary | ICD-10-CM | POA: Diagnosis not present

## 2019-08-17 DIAGNOSIS — G20 Parkinson's disease: Secondary | ICD-10-CM | POA: Diagnosis not present

## 2019-08-17 DIAGNOSIS — N401 Enlarged prostate with lower urinary tract symptoms: Secondary | ICD-10-CM | POA: Diagnosis not present

## 2019-08-17 DIAGNOSIS — Z9181 History of falling: Secondary | ICD-10-CM | POA: Diagnosis not present

## 2019-08-17 DIAGNOSIS — Z905 Acquired absence of kidney: Secondary | ICD-10-CM | POA: Diagnosis not present

## 2019-08-17 DIAGNOSIS — I10 Essential (primary) hypertension: Secondary | ICD-10-CM | POA: Diagnosis not present

## 2019-08-17 DIAGNOSIS — M5417 Radiculopathy, lumbosacral region: Secondary | ICD-10-CM | POA: Diagnosis not present

## 2019-08-17 DIAGNOSIS — E785 Hyperlipidemia, unspecified: Secondary | ICD-10-CM | POA: Diagnosis not present

## 2019-08-17 DIAGNOSIS — I4891 Unspecified atrial fibrillation: Secondary | ICD-10-CM | POA: Diagnosis not present

## 2019-08-17 DIAGNOSIS — F17210 Nicotine dependence, cigarettes, uncomplicated: Secondary | ICD-10-CM | POA: Diagnosis not present

## 2019-08-26 ENCOUNTER — Encounter: Payer: Self-pay | Admitting: Urology

## 2019-08-26 ENCOUNTER — Other Ambulatory Visit: Payer: Self-pay

## 2019-08-26 ENCOUNTER — Ambulatory Visit: Payer: PPO | Admitting: Urology

## 2019-08-26 VITALS — BP 104/71 | HR 76 | Ht 69.0 in | Wt 173.0 lb

## 2019-08-26 DIAGNOSIS — N401 Enlarged prostate with lower urinary tract symptoms: Secondary | ICD-10-CM

## 2019-08-26 DIAGNOSIS — R310 Gross hematuria: Secondary | ICD-10-CM | POA: Diagnosis not present

## 2019-08-26 NOTE — Progress Notes (Signed)
08/26/2019 1:41 PM   Russell Terry 02-02-1941 [id number]  Referring provider: Maryland Pink, MD [address] [facility] [place],  [place] [address]  Chief Complaint  Patient presents with  . Follow-up    6 MO     Urologic problem list: -BPH with lower urinary tract symptoms; on dutasteride, doxazosin and tamsulosin; cystoscopy with trilobar enlargement/intravesical median lobe  -History elevated PSA with benign prostate biopsy  -Recurrent nephrolithiasis; lower pole calculi  - T1b renal cell carcinoma status post right laparoscopic nephrectomy [facility] 2015   HPI: 79 y.o. male presents for follow-up.  He was hospitalized in December 2020 with gross hematuria and clot retention.  He was placed on CBI with resolution of his hematuria.  He denies recurrent gross hematuria.  He does have stable lower urinary tract symptoms and urinary incontinence.  His wife states he has  progressively worsening Parkinson's and dementia.   PMH: Past Medical History:  Diagnosis Date  . Atrial fibrillation ([place]) 12/11/2013  . BPH (benign prostatic hyperplasia)   . Bradycardia 12/11/2013  . Calculus of kidney 12/06/2013  . Cancer ([place])    rt kidney removed  . Chronic kidney disease    stones,rt kidney removed  . COPD (chronic obstructive pulmonary disease) ([place]) 12/11/2013  . DDD (degenerative disc disease), lumbar 12/13/2013  . Delirium   . Dementia ([place])   . Elevated prostate specific antigen (PSA) 03/03/2013  . Encephalopathy acute 07/22/2016  . Enlarged prostate with lower urinary tract symptoms (LUTS) 03/03/2013  . Essential hypertension 07/22/2016  . Hyperlipidemia, unspecified 07/09/2017  . Hypertension   . Meningioma ([place]) 10/08/2016  . Parkinsonian features 10/08/2016  . Pleuritic chest pain 05/31/2014  . Renal cell carcinoma ([place]) 05/04/2016  . Renal cyst, acquired, left 04/22/2015    Surgical History: Past Surgical History:  Procedure Laterality Date  . BRAIN  SURGERY  2017  . COLON SURGERY    . COLONOSCOPY WITH PROPOFOL N/A 12/31/2016   Procedure: COLONOSCOPY WITH PROPOFOL;  Surgeon: Lollie Sails, MD;  Location: [facility] ENDOSCOPY;  Service: Endoscopy;  Laterality: N/A;  . [place]    . LUMBAR LAMINECTOMY/DECOMPRESSION MICRODISCECTOMY N/A 10/18/2015   Procedure: LUMBAR LAMINECTOMY/DECOMPRESSION MICRODISCECTOMY 3 LEVELS;  Surgeon: Newman Pies, MD;  Location: [facility] NEURO ORS;  Service: Neurosurgery;  Laterality: N/A;  L23 L34 L45 laminectomy and foraminotomy  . NEPHRECTOMY     2012  . URETEROSCOPY WITH HOLMIUM LASER LITHOTRIPSY      Home Medications:  Allergies as of 08/26/2019      Reactions   Quinapril Cough   Ambien [zolpidem Tartrate] Other (See Comments)   Unspecified reaction   Fenofibrate Other (See Comments)   Unknown   Zolpidem    Other reaction(s): Other (See Comments) Unspecified reaction   Amoxicillin-pot Clavulanate Nausea And Vomiting   Morphine Nausea Only      Medication List       Accurate as of August 26, 2019  1:41 PM. If you have any questions, ask your nurse or doctor.        acetaminophen 325 MG tablet Commonly known as: TYLENOL Take 2 tablets (650 mg total) by mouth every 6 (six) hours as needed for mild pain (or Fever >/= 101).   albuterol 108 (90 Base) MCG/ACT inhaler Commonly known as: VENTOLIN HFA Inhale 2 puffs into the lungs every 6 (six) hours as needed for wheezing or shortness of breath.   Benadryl Allergy 25 MG tablet Generic drug: diphenhydrAMINE Take 25 mg by  mouth daily as needed (itching or allergies).   bisacodyl 5 MG EC tablet Commonly known as: DULCOLAX Take 1 tablet (5 mg total) by mouth daily as needed for moderate constipation.   carbidopa-levodopa 50-200 MG tablet Commonly known as: SINEMET CR Take 1 tablet by mouth 2 (two) times daily.   carbidopa-levodopa 25-100 MG tablet Commonly known as: SINEMET IR Take 2 tablets by mouth 3 (three) times daily.     cetirizine 10 MG tablet Commonly known as: ZYRTEC Take 10 mg by mouth daily.   diltiazem 240 MG 24 hr capsule Commonly known as: CARDIZEM CD Take 240 mg by mouth daily.   diltiazem 240 MG 24 hr capsule Commonly known as: TIAZAC Take by mouth.   donepezil 10 MG tablet Commonly known as: ARICEPT Take 10 mg by mouth every evening.   doxazosin 4 MG tablet Commonly known as: CARDURA Take 4 mg by mouth daily.   dutasteride 0.5 MG capsule Commonly known as: AVODART Take 0.5 mg by mouth daily.   gabapentin 100 MG capsule Commonly known as: NEURONTIN 1 po qHS x7 then 2 capsules as tolerated   hydrocortisone 2.5 % cream PLACE RECTALLY 2 (TWO) TIMES DAILY   Melatonin 5 MG Tabs Take 5 mg by mouth at bedtime.   omeprazole 20 MG tablet Commonly known as: PRILOSEC OTC Take by mouth.   tamsulosin 0.4 MG Caps capsule Commonly known as: FLOMAX Take 0.4 mg by mouth 2 (two) times daily.   traZODone 100 MG tablet Commonly known as: DESYREL Take 200 mg by mouth at bedtime.   venlafaxine XR 75 MG 24 hr capsule Commonly known as: EFFEXOR-XR Take 75 mg by mouth at bedtime.   vitamin B-12 1000 MCG tablet Commonly known as: CYANOCOBALAMIN Take 1,000 mcg by mouth at bedtime.       Allergies:  Allergies  Allergen Reactions  . Quinapril Cough  . Ambien [Zolpidem Tartrate] Other (See Comments)    Unspecified reaction  . Fenofibrate Other (See Comments)    Unknown  . Zolpidem     Other reaction(s): Other (See Comments) Unspecified reaction  . Amoxicillin-Pot Clavulanate Nausea And Vomiting  . Morphine Nausea Only    Family History: Family History  Problem Relation Age of Onset  . Bladder Cancer Neg Hx   . Kidney cancer Neg Hx   . Prolactinoma Neg Hx   . Prostate cancer Neg Hx     Social History:  reports that he has been smoking. He has a 50.00 pack-year smoking history. He has never used smokeless tobacco. He reports that he does not drink alcohol or use  drugs.  ROS: Otherwise noncontributory except as per the HPI  Physical Exam: BP 104/71 (BP Location: Left Arm, Patient Position: Sitting, Cuff Size: Normal)   Pulse 76   Ht 5\' 9"  (1.753 m)   Wt 173 lb (78.5 kg)   BMI 25.55 kg/m   Constitutional:  Alert and oriented, No acute distress. HEENT: [place] AT, moist mucus membranes.  Trachea midline, no masses. Cardiovascular: No clubbing, cyanosis, or edema. Respiratory: Normal respiratory effort, no increased work of breathing. Skin: No rashes, bruises or suspicious lesions. Neurologic: Grossly intact, no focal deficits, moving all 4 extremities. Psychiatric: Normal mood and affect.   Assessment & Plan:   Episode of gross hematuria with clot retention requiring hospitalization/CBI October 2020.  I recommended scheduling follow-up office cystoscopy.  Abbie Sons, [place] [address] [place], [place] [address] [phone]

## 2019-08-26 NOTE — Patient Instructions (Signed)

## 2019-08-29 DIAGNOSIS — F0390 Unspecified dementia without behavioral disturbance: Secondary | ICD-10-CM | POA: Diagnosis not present

## 2019-09-02 DIAGNOSIS — M5417 Radiculopathy, lumbosacral region: Secondary | ICD-10-CM | POA: Diagnosis not present

## 2019-09-02 DIAGNOSIS — N3941 Urge incontinence: Secondary | ICD-10-CM | POA: Diagnosis not present

## 2019-09-02 DIAGNOSIS — G20 Parkinson's disease: Secondary | ICD-10-CM | POA: Diagnosis not present

## 2019-09-02 DIAGNOSIS — N401 Enlarged prostate with lower urinary tract symptoms: Secondary | ICD-10-CM | POA: Diagnosis not present

## 2019-09-02 DIAGNOSIS — J449 Chronic obstructive pulmonary disease, unspecified: Secondary | ICD-10-CM | POA: Diagnosis not present

## 2019-09-02 DIAGNOSIS — Z905 Acquired absence of kidney: Secondary | ICD-10-CM | POA: Diagnosis not present

## 2019-09-02 DIAGNOSIS — M4727 Other spondylosis with radiculopathy, lumbosacral region: Secondary | ICD-10-CM | POA: Diagnosis not present

## 2019-09-02 DIAGNOSIS — F17210 Nicotine dependence, cigarettes, uncomplicated: Secondary | ICD-10-CM | POA: Diagnosis not present

## 2019-09-02 DIAGNOSIS — E785 Hyperlipidemia, unspecified: Secondary | ICD-10-CM | POA: Diagnosis not present

## 2019-09-02 DIAGNOSIS — I4891 Unspecified atrial fibrillation: Secondary | ICD-10-CM | POA: Diagnosis not present

## 2019-09-02 DIAGNOSIS — Z86010 Personal history of colonic polyps: Secondary | ICD-10-CM | POA: Diagnosis not present

## 2019-09-02 DIAGNOSIS — M4807 Spinal stenosis, lumbosacral region: Secondary | ICD-10-CM | POA: Diagnosis not present

## 2019-09-02 DIAGNOSIS — I10 Essential (primary) hypertension: Secondary | ICD-10-CM | POA: Diagnosis not present

## 2019-09-02 DIAGNOSIS — F0280 Dementia in other diseases classified elsewhere without behavioral disturbance: Secondary | ICD-10-CM | POA: Diagnosis not present

## 2019-09-02 DIAGNOSIS — Z86011 Personal history of benign neoplasm of the brain: Secondary | ICD-10-CM | POA: Diagnosis not present

## 2019-09-02 DIAGNOSIS — Z85528 Personal history of other malignant neoplasm of kidney: Secondary | ICD-10-CM | POA: Diagnosis not present

## 2019-09-02 DIAGNOSIS — Z9181 History of falling: Secondary | ICD-10-CM | POA: Diagnosis not present

## 2019-09-10 DIAGNOSIS — M5136 Other intervertebral disc degeneration, lumbar region: Secondary | ICD-10-CM | POA: Diagnosis not present

## 2019-09-10 DIAGNOSIS — M48062 Spinal stenosis, lumbar region with neurogenic claudication: Secondary | ICD-10-CM | POA: Diagnosis not present

## 2019-09-10 DIAGNOSIS — M5416 Radiculopathy, lumbar region: Secondary | ICD-10-CM | POA: Diagnosis not present

## 2019-09-17 ENCOUNTER — Other Ambulatory Visit: Payer: Self-pay | Admitting: Urology

## 2019-09-23 ENCOUNTER — Encounter: Payer: Self-pay | Admitting: Urology

## 2019-09-23 ENCOUNTER — Ambulatory Visit: Payer: PPO | Admitting: Urology

## 2019-09-23 ENCOUNTER — Other Ambulatory Visit: Payer: Self-pay

## 2019-09-23 VITALS — BP 111/73 | HR 79 | Ht 72.0 in | Wt 173.0 lb

## 2019-09-23 DIAGNOSIS — R310 Gross hematuria: Secondary | ICD-10-CM | POA: Diagnosis not present

## 2019-09-23 LAB — MICROSCOPIC EXAMINATION: WBC, UA: >30 /hpf — AB (ref 0–5)

## 2019-09-23 LAB — URINALYSIS, COMPLETE
Bilirubin, UA: NEGATIVE
Glucose, UA: NEGATIVE
Nitrite, UA: NEGATIVE
Specific Gravity, UA: 1.02 (ref 1.005–1.030)
Urobilinogen, Ur: 0.2 mg/dL (ref 0.2–1.0)
pH, UA: 5.5 (ref 5.0–7.5)

## 2019-09-23 NOTE — Progress Notes (Signed)
   09/23/19  CC:  Chief Complaint  Patient presents with  . Cysto    HPI: Refer to my note of 08/26/2019.  Had episode of gross hematuria requiring CBI in December.  Never had follow-up cystoscopy.  No complaints today.  Denies recurrent gross hematuria.  Blood pressure 111/73, pulse 79, height 6' (1.829 m), weight 173 lb (78.5 kg). NED. A&Ox3.   No respiratory distress   Abd soft, NT, ND Normal phallus with bilateral descended testicles  Cystoscopy Procedure Note  Patient identification was confirmed, informed consent was obtained, and patient was prepped using Betadine solution.  Lidocaine jelly was administered per urethral meatus.     Pre-Procedure: - Inspection reveals a normal caliber ureteral meatus.  Procedure: The flexible cystoscope was introduced without difficulty - No urethral strictures/lesions are present. - Marked lateral lobe enlargement with hypervascularity prostate  - Elevated bladder neck - Bilateral ureteral orifices identified - Bladder mucosa  reveals no ulcers, tumors, or lesions - No bladder stones -Moderate trabeculation  Retroflexion shows intravesical lateral lobe protrusion   Post-Procedure: - Patient tolerated the procedure well  Assessment/ Plan: -Marked prostate enlargement -Hematuria most likely secondary to above -No evidence bladder mucosal abnormalities/tumor   Abbie Sons, MD

## 2019-09-24 ENCOUNTER — Encounter: Payer: Self-pay | Admitting: Urology

## 2019-09-26 DIAGNOSIS — F0390 Unspecified dementia without behavioral disturbance: Secondary | ICD-10-CM | POA: Diagnosis not present

## 2019-10-27 DIAGNOSIS — F0390 Unspecified dementia without behavioral disturbance: Secondary | ICD-10-CM | POA: Diagnosis not present

## 2019-11-09 DIAGNOSIS — J439 Emphysema, unspecified: Secondary | ICD-10-CM | POA: Diagnosis not present

## 2019-11-09 DIAGNOSIS — I10 Essential (primary) hypertension: Secondary | ICD-10-CM | POA: Diagnosis not present

## 2019-11-09 DIAGNOSIS — R062 Wheezing: Secondary | ICD-10-CM | POA: Diagnosis not present

## 2019-11-09 DIAGNOSIS — F17209 Nicotine dependence, unspecified, with unspecified nicotine-induced disorders: Secondary | ICD-10-CM | POA: Diagnosis not present

## 2019-11-09 DIAGNOSIS — F0280 Dementia in other diseases classified elsewhere without behavioral disturbance: Secondary | ICD-10-CM | POA: Diagnosis not present

## 2019-11-09 DIAGNOSIS — R259 Unspecified abnormal involuntary movements: Secondary | ICD-10-CM | POA: Diagnosis not present

## 2019-11-09 DIAGNOSIS — C649 Malignant neoplasm of unspecified kidney, except renal pelvis: Secondary | ICD-10-CM | POA: Diagnosis not present

## 2019-11-09 DIAGNOSIS — G301 Alzheimer's disease with late onset: Secondary | ICD-10-CM | POA: Diagnosis not present

## 2019-11-09 DIAGNOSIS — N390 Urinary tract infection, site not specified: Secondary | ICD-10-CM | POA: Diagnosis not present

## 2019-11-09 DIAGNOSIS — I480 Paroxysmal atrial fibrillation: Secondary | ICD-10-CM | POA: Diagnosis not present

## 2019-11-10 ENCOUNTER — Telehealth: Payer: Self-pay

## 2019-11-10 DIAGNOSIS — N138 Other obstructive and reflux uropathy: Secondary | ICD-10-CM

## 2019-11-10 NOTE — Telephone Encounter (Signed)
Patient's wife called wanting to know if patient is a candidate for a Urolift procedure. He is interested in pursuing this option to help with his urination

## 2019-11-10 NOTE — Telephone Encounter (Signed)
His prostate is too large for a UroLift.  The most effective procedure would be Holep.  He has significant medical comorbidities and prostatic artery embolization would be a safer option.

## 2019-11-11 ENCOUNTER — Other Ambulatory Visit: Payer: Self-pay | Admitting: Radiology

## 2019-11-11 DIAGNOSIS — N138 Other obstructive and reflux uropathy: Secondary | ICD-10-CM

## 2019-11-11 NOTE — Addendum Note (Signed)
Addended by: Abbie Sons on: 11/11/2019 01:37 PM   Modules accepted: Orders

## 2019-11-11 NOTE — Telephone Encounter (Signed)
Patient's wife was notified and is intrested in having a referral put in for PAE

## 2019-11-23 ENCOUNTER — Ambulatory Visit
Admission: RE | Admit: 2019-11-23 | Discharge: 2019-11-23 | Disposition: A | Discharge status: Home / Self Care | Payer: PPO | Source: Ambulatory Visit | Attending: Urology | Admitting: Urology

## 2019-11-23 ENCOUNTER — Encounter: Payer: Self-pay | Admitting: *Deleted

## 2019-11-23 ENCOUNTER — Other Ambulatory Visit: Payer: Self-pay

## 2019-11-23 ENCOUNTER — Other Ambulatory Visit: Payer: Self-pay | Admitting: Interventional Radiology

## 2019-11-23 DIAGNOSIS — N400 Enlarged prostate without lower urinary tract symptoms: Secondary | ICD-10-CM

## 2019-11-23 DIAGNOSIS — N138 Other obstructive and reflux uropathy: Secondary | ICD-10-CM

## 2019-11-23 DIAGNOSIS — N401 Enlarged prostate with lower urinary tract symptoms: Secondary | ICD-10-CM | POA: Diagnosis not present

## 2019-11-23 DIAGNOSIS — R3915 Urgency of urination: Secondary | ICD-10-CM | POA: Diagnosis not present

## 2019-11-23 DIAGNOSIS — R350 Frequency of micturition: Secondary | ICD-10-CM | POA: Diagnosis not present

## 2019-11-23 HISTORY — PX: IR RADIOLOGIST EVAL & MGMT: IMG5224

## 2019-11-23 NOTE — Consult Note (Signed)
Chief Complaint: Patient was consulted remotely today (TeleHealth) for benign prostatic hypertrophy/hyperplasia at the request of Stoioff,Scott C.    Referring Physician(s): Stoioff,Scott C  History of Present Illness: Russell Terry is a 79 y.o. male referred at the kind request of Dr. Bernardo Heater to evaluate for potential prostate artery embolization.  Russell Terry and his wife participated in a video visitation with me via the MyChart feature in epic.  Russell Terry has been struggling with BPH and lower urinary tract symptoms for approximately the past 10-15 years.  In October 2020 he was admitted to the hospital with spontaneous hematuria resulting in large clot retention and necessitating catheterization and continuous bladder irrigation.  He had a similar episode in 2017 requiring irrigation and clot evacuation in the operating room.  This was treated subsequently by prostatic fulguration which helped for a period of time.  His lower urinary tract symptoms include frequent urination, urgency and significant nocturia.  He says he has to get up to urinate all throughout the night which interferes with his sleep.  He has a prior history of right-sided renal cell carcinoma treated by nephrectomy.  He does have an element of underlying chronic kidney disease with his solitary remaining left kidney.  His creatinine ranges between 1.1 and 1.46.  He underwent an in office cystoscopy By Dr. Bernardo Heater in March of this year.  Cystoscopy demonstrated the following:  - No urethral strictures/lesions are present. - Marked lateral lobe enlargement with hypervascularity prostate  - Elevated bladder neck - Bilateral ureteral orifices identified - Bladder mucosa  reveals no ulcers, tumors, or lesions - No bladder stones - Moderate trabeculation - Retroflexion shows intravesical lateral lobe protrusion - No evidence of bladder mucosal abnormality/tumor  Currently, Russell Terry is in his  usual state of health.  He denies recent hematuria, abdominal pain, chest pain, shortness of breath, fever or chills.  Current therapy for his BPH includes tamsulosin, dutasteride and doxazosin.    He has an elevated PSA but prior prostate biopsy was benign.  Past Medical History:  Diagnosis Date  . Atrial fibrillation ([place]) 12/11/2013  . BPH (benign prostatic hyperplasia)   . Bradycardia 12/11/2013  . Calculus of kidney 12/06/2013  . Cancer ([place])    rt kidney removed  . Chronic kidney disease    stones,rt kidney removed  . COPD (chronic obstructive pulmonary disease) ([place]) 12/11/2013  . DDD (degenerative disc disease), lumbar 12/13/2013  . Delirium   . Dementia ([place])   . Elevated prostate specific antigen (PSA) 03/03/2013  . Encephalopathy acute 07/22/2016  . Enlarged prostate with lower urinary tract symptoms (LUTS) 03/03/2013  . Essential hypertension 07/22/2016  . Hyperlipidemia, unspecified 07/09/2017  . Hypertension   . Meningioma ([place]) 10/08/2016  . Parkinsonian features 10/08/2016  . Pleuritic chest pain 05/31/2014  . Renal cell carcinoma ([place]) 05/04/2016  . Renal cyst, acquired, left 04/22/2015    Past Surgical History:  Procedure Laterality Date  . BRAIN SURGERY  2017  . COLON SURGERY    . COLONOSCOPY WITH PROPOFOL N/A 12/31/2016   Procedure: COLONOSCOPY WITH PROPOFOL;  Surgeon: Lollie Sails, MD;  Location[facility] ENDOSCOPY;  Service: Endoscopy;  Laterality: N/A;  . [place]    . LUMBAR LAMINECTOMY/DECOMPRESSION MICRODISCECTOMY N/A 10/18/2015   Procedure: LUMBAR LAMINECTOMY/DECOMPRESSION MICRODISCECTOMY 3 LEVELS;  Surgeon: Newman Pies, MD;  Location: [facility] NEURO ORS;  Service: Neurosurgery;  Laterality: N/A;  L23 L34 L45 laminectomy and foraminotomy  . NEPHRECTOMY     2012  .  URETEROSCOPY WITH HOLMIUM LASER LITHOTRIPSY      Allergies: Quinapril, Ambien [zolpidem tartrate], Fenofibrate, Zolpidem, Amoxicillin-pot clavulanate, and  Morphine  Medications: Prior to Admission medications   Medication Sig Start Date End Date Taking? Authorizing Provider  acetaminophen (TYLENOL) 325 MG tablet Take 2 tablets (650 mg total) by mouth every 6 (six) hours as needed for mild pain (or Fever >/= 101). 04/25/19   Stark Jock Jude, MD  albuterol (VENTOLIN HFA) 108 (90 Base) MCG/ACT inhaler Inhale 2 puffs into the lungs every 6 (six) hours as needed for wheezing or shortness of breath.  01/11/19   [provider]  bisacodyl (DULCOLAX) 5 MG EC tablet Take 1 tablet (5 mg total) by mouth daily as needed for moderate constipation. 04/25/19   Stark Jock, Jude, MD  carbidopa-levodopa (SINEMET CR) 50-200 MG tablet Take 1 tablet by mouth 2 (two) times daily.    [provider]  carbidopa-levodopa (SINEMET IR) 25-100 MG tablet Take 2 tablets by mouth 3 (three) times daily.  05/03/17   [provider]  cetirizine (ZYRTEC) 10 MG tablet Take 10 mg by mouth daily.    [provider]  diltiazem (CARDIZEM CD) 240 MG 24 hr capsule Take 240 mg by mouth daily.     [provider]  diltiazem (TIAZAC) 240 MG 24 hr capsule Take by mouth. 08/10/19   [provider]  diphenhydrAMINE (BENADRYL ALLERGY) 25 MG tablet Take 25 mg by mouth daily as needed (itching or allergies).     [provider]  donepezil (ARICEPT) 10 MG tablet Take 10 mg by mouth every evening.     [provider]  doxazosin (CARDURA) 4 MG tablet Take 4 mg by mouth daily.    [provider]  dutasteride (AVODART) 0.5 MG capsule Take 0.5 mg by mouth daily.    [provider]  gabapentin (NEURONTIN) 100 MG capsule 1 po qHS x7 then 2 capsules as tolerated 07/30/19   [provider]  hydrocortisone 2.5 % cream PLACE RECTALLY 2 (TWO) TIMES DAILY 12/29/18   [provider]  Melatonin 5 MG TABS Take 5 mg by mouth at bedtime.    [provider]  omeprazole (PRILOSEC OTC) 20 MG tablet Take by mouth.  03/13/14   [provider]  tamsulosin (FLOMAX) 0.4 MG CAPS capsule Take 0.4 mg by mouth 2 (two) times daily.    [provider]  traZODone (DESYREL) 100 MG tablet Take 200 mg by mouth at bedtime.  07/04/18   [provider]  venlafaxine XR (EFFEXOR-XR) 75 MG 24 hr capsule Take 75 mg by mouth at bedtime.  04/14/18 04/21/19  [provider]  vitamin B-12 (CYANOCOBALAMIN) 1000 MCG tablet Take 1,000 mcg by mouth at bedtime.     [provider]     Family History  Problem Relation Age of Onset  . Bladder Cancer Neg Hx   . Kidney cancer Neg Hx   . Prolactinoma Neg Hx   . Prostate cancer Neg Hx     Social History   Socioeconomic History  . Marital status: Married    Spouse name: Not on file  . Number of children: Not on file  . Years of education: Not on file  . Highest education level: Not on file  Occupational History  . Not on file  Tobacco Use  . Smoking status: Current Every Day Smoker    Packs/day: 1.00    Years: 50.00    Pack years: 50.00  . Smokeless tobacco: Never  Used  Substance and Sexual Activity  . Alcohol use: No  . Drug use: No  . Sexual activity: Yes    Birth control/protection: None  Other Topics Concern  . Not on file  Social History Narrative  . Not on file   Social Determinants of Health   Financial Resource Strain: Low Risk   . Difficulty of Paying Living Expenses: Not hard at all  Food Insecurity: No Food Insecurity  . Worried About Charity fundraiser in the Last Year: Never true  . Ran Out of Food in the Last Year: Never true  Transportation Needs: No Transportation Needs  . Lack of Transportation (Medical): No  . Lack of Transportation (Non-Medical): No  Physical Activity: Inactive  . Days of Exercise per Week: 0 days  . Minutes of Exercise per Session: 0 min  Stress: No Stress Concern Present  . Feeling of Stress : Not at all  Social Connections: Slightly Isolated  . Frequency of Communication with  Friends and Family: More than three times a week  . Frequency of Social Gatherings with Friends and Family: More than three times a week  . Attends Religious Services: More than 4 times per year  . Active Member of Clubs or Organizations: No  . Attends Archivist Meetings: Never  . Marital Status: Married     Review of Systems  Review of Systems: A 12 point ROS discussed and pertinent positives are indicated in the HPI above.  All other systems are negative.  Physical Exam No direct physical exam was performed (except for noted visual exam findings with Video Visits).   Vital Signs: There were no vitals taken for this visit.  Imaging: No results found.  Labs:  CBC: Recent Labs    04/21/19 0925 04/22/19 0631 04/24/19 0429 04/25/19 0512  WBC 7.0 8.2 10.3 6.2  HGB 13.1 10.1* 9.3* 9.0*  HCT 37.6* 29.8* 27.2* 26.3*  PLT 183 177 162 180    COAGS: No results for input(s): INR, APTT in the last 8760 hours.  BMP: Recent Labs    04/21/19 0925 04/22/19 0631 04/24/19 0429 04/25/19 0512  NA 135 136 136 137  K 4.2 3.8 3.4* 3.3*  CL 101 104 106 107  CO2 25 27 25 24   GLUCOSE 118* 94 121* 102*  BUN 19 22 11 10   CALCIUM 9.0 7.9* 8.4* 8.3*  CREATININE 1.46* 1.63* 1.07 1.04  GFRNONAA 45* 39* >60 >60  GFRAA 52* 46* >60 >60    LIVER FUNCTION TESTS: Recent Labs    01/05/19 1558  BILITOT 1.2  AST 14*  ALT 5  ALKPHOS 61  PROT 6.5  ALBUMIN 4.0    TUMOR MARKERS: No results for input(s): AFPTM, CEA, CA199, CHROMGRNA in the last 8760 hours.  Assessment and Plan:  Very pleasant 79 year old male with significant BPH complicated by lower urinary tract symptoms which she has struggled with for the past 10-15 years as well as at least 2 prior episodes of spontaneous hematuria resulting in clot retention and requiring catheterization and continuous bladder irrigation.  Due to his age and underlying comorbidities, he is not an optimal surgical candidate for  transurethral resection or other prostatic directed interventions.  However, he is a very good candidate for bilateral prostate artery embolization.  The goal of prostate artery embolization is a clinically significant decrease in vascularity and size of the prostate gland which would result in improvement of his lower urinary tract symptoms as well as a significant decrease in  the risk and severity of future episodes of hematuria.  We discussed the potential risks and side effects of the procedure including postprocedural dysuria, pelvic pain, post embolization syndrome including fatigue, muscle aches malaise and mild fever.  He understands and would like to pursue prostatic artery embolization.  He does have a solitary kidney with mild underlying renal dysfunction.  We will be judicious with contrast use and make sure he is hydrated around the time of the procedure and preprocedural imaging to minimize the risk of contrast-induced nephropathy.  1.)  CT arteriogram of the pelvis to evaluate prostatic artery anatomy prior to prostate artery embolization.  [facility] exam [facility] at [facility].  2.)  Schedule for bilateral prostate artery embolization to be performed at [facility].     Thank you for this interesting consult.  I greatly enjoyed meeting Russell Terry and look forward to participating in their care.  A copy of this report was sent to the requesting provider on this date.  Electronically Signed: Jacqulynn Cadet 11/23/2019, 12:44 PM   I spent a total of  30 Minutes  in remote  clinical consultation, greater than 50% of which was counseling/coordinating care for benign prostatic hyperplasia with lower urinary tract symptoms and hematuria with clot retention.  Visit type: Audio and video Research scientist (physical sciences)).   Alternative for in-person consultation at [facility], [address], [place], [place]. This visit type was conducted due to national  recommendations for restrictions regarding the COVID-19 Pandemic (e.g. social distancing).  This format is felt to be most appropriate for this patient at this time.  All issues noted in this document were discussed and addressed.

## 2019-11-26 DIAGNOSIS — F0390 Unspecified dementia without behavioral disturbance: Secondary | ICD-10-CM | POA: Diagnosis not present

## 2019-12-01 DIAGNOSIS — R0600 Dyspnea, unspecified: Secondary | ICD-10-CM | POA: Diagnosis not present

## 2019-12-01 DIAGNOSIS — J310 Chronic rhinitis: Secondary | ICD-10-CM | POA: Diagnosis not present

## 2019-12-01 DIAGNOSIS — J439 Emphysema, unspecified: Secondary | ICD-10-CM | POA: Diagnosis not present

## 2019-12-01 DIAGNOSIS — F17218 Nicotine dependence, cigarettes, with other nicotine-induced disorders: Secondary | ICD-10-CM | POA: Diagnosis not present

## 2019-12-01 DIAGNOSIS — J0101 Acute recurrent maxillary sinusitis: Secondary | ICD-10-CM | POA: Diagnosis not present

## 2019-12-02 ENCOUNTER — Ambulatory Visit
Admission: RE | Admit: 2019-12-02 | Discharge: 2019-12-02 | Disposition: A | Discharge status: Home / Self Care | Payer: PPO | Source: Ambulatory Visit | Attending: Interventional Radiology | Admitting: Interventional Radiology

## 2019-12-02 ENCOUNTER — Other Ambulatory Visit: Payer: Self-pay

## 2019-12-02 DIAGNOSIS — N400 Enlarged prostate without lower urinary tract symptoms: Secondary | ICD-10-CM | POA: Insufficient documentation

## 2019-12-02 DIAGNOSIS — I700 Atherosclerosis of aorta: Secondary | ICD-10-CM | POA: Diagnosis not present

## 2019-12-02 LAB — POCT I-STAT CREATININE: Creatinine, Ser: 1.4 mg/dL — ABNORMAL HIGH (ref 0.61–1.24)

## 2019-12-02 MED ORDER — IOHEXOL 350 MG/ML SOLN
85.0000 mL | Freq: Once | INTRAVENOUS | Status: AC | PRN
  Administered 2019-12-02: 85 mL via INTRAVENOUS

## 2019-12-08 ENCOUNTER — Other Ambulatory Visit (HOSPITAL_COMMUNITY): Payer: Self-pay | Admitting: Interventional Radiology

## 2019-12-08 DIAGNOSIS — N400 Enlarged prostate without lower urinary tract symptoms: Secondary | ICD-10-CM

## 2019-12-16 DIAGNOSIS — F411 Generalized anxiety disorder: Secondary | ICD-10-CM | POA: Diagnosis not present

## 2019-12-16 DIAGNOSIS — G301 Alzheimer's disease with late onset: Secondary | ICD-10-CM | POA: Diagnosis not present

## 2019-12-16 DIAGNOSIS — G20 Parkinson's disease: Secondary | ICD-10-CM | POA: Diagnosis not present

## 2019-12-16 DIAGNOSIS — F0280 Dementia in other diseases classified elsewhere without behavioral disturbance: Secondary | ICD-10-CM | POA: Diagnosis not present

## 2019-12-16 DIAGNOSIS — G4700 Insomnia, unspecified: Secondary | ICD-10-CM | POA: Diagnosis not present

## 2019-12-23 ENCOUNTER — Other Ambulatory Visit: Payer: Self-pay | Admitting: Physician Assistant

## 2019-12-23 ENCOUNTER — Other Ambulatory Visit: Payer: Self-pay | Admitting: Radiology

## 2019-12-24 ENCOUNTER — Other Ambulatory Visit (HOSPITAL_COMMUNITY): Payer: Self-pay | Admitting: Interventional Radiology

## 2019-12-24 ENCOUNTER — Other Ambulatory Visit: Payer: Self-pay

## 2019-12-24 ENCOUNTER — Encounter (HOSPITAL_COMMUNITY): Payer: Self-pay

## 2019-12-24 ENCOUNTER — Ambulatory Visit (HOSPITAL_COMMUNITY)
Admission: RE | Admit: 2019-12-24 | Discharge: 2019-12-24 | Disposition: A | Discharge status: Home / Self Care | Payer: PPO | Source: Ambulatory Visit | Attending: Interventional Radiology | Admitting: Interventional Radiology

## 2019-12-24 VITALS — BP 121/80 | HR 51 | Temp 98.0°F | Resp 12 | Ht 69.5 in | Wt 175.0 lb

## 2019-12-24 DIAGNOSIS — Z885 Allergy status to narcotic agent status: Secondary | ICD-10-CM | POA: Diagnosis not present

## 2019-12-24 DIAGNOSIS — M542 Cervicalgia: Secondary | ICD-10-CM

## 2019-12-24 DIAGNOSIS — N189 Chronic kidney disease, unspecified: Secondary | ICD-10-CM | POA: Diagnosis not present

## 2019-12-24 DIAGNOSIS — Z880 Allergy status to penicillin: Secondary | ICD-10-CM | POA: Diagnosis not present

## 2019-12-24 DIAGNOSIS — Z905 Acquired absence of kidney: Secondary | ICD-10-CM | POA: Diagnosis not present

## 2019-12-24 DIAGNOSIS — I129 Hypertensive chronic kidney disease with stage 1 through stage 4 chronic kidney disease, or unspecified chronic kidney disease: Secondary | ICD-10-CM | POA: Insufficient documentation

## 2019-12-24 DIAGNOSIS — J449 Chronic obstructive pulmonary disease, unspecified: Secondary | ICD-10-CM | POA: Insufficient documentation

## 2019-12-24 DIAGNOSIS — R351 Nocturia: Secondary | ICD-10-CM | POA: Diagnosis not present

## 2019-12-24 DIAGNOSIS — N400 Enlarged prostate without lower urinary tract symptoms: Secondary | ICD-10-CM

## 2019-12-24 DIAGNOSIS — R319 Hematuria, unspecified: Secondary | ICD-10-CM | POA: Diagnosis not present

## 2019-12-24 DIAGNOSIS — I4891 Unspecified atrial fibrillation: Secondary | ICD-10-CM | POA: Diagnosis not present

## 2019-12-24 DIAGNOSIS — R3915 Urgency of urination: Secondary | ICD-10-CM | POA: Insufficient documentation

## 2019-12-24 DIAGNOSIS — E785 Hyperlipidemia, unspecified: Secondary | ICD-10-CM | POA: Diagnosis not present

## 2019-12-24 DIAGNOSIS — N401 Enlarged prostate with lower urinary tract symptoms: Secondary | ICD-10-CM | POA: Insufficient documentation

## 2019-12-24 DIAGNOSIS — F17210 Nicotine dependence, cigarettes, uncomplicated: Secondary | ICD-10-CM | POA: Insufficient documentation

## 2019-12-24 DIAGNOSIS — Z79899 Other long term (current) drug therapy: Secondary | ICD-10-CM | POA: Insufficient documentation

## 2019-12-24 DIAGNOSIS — I718 Aortic aneurysm of unspecified site, ruptured: Secondary | ICD-10-CM | POA: Diagnosis not present

## 2019-12-24 DIAGNOSIS — Z7982 Long term (current) use of aspirin: Secondary | ICD-10-CM | POA: Insufficient documentation

## 2019-12-24 DIAGNOSIS — Z888 Allergy status to other drugs, medicaments and biological substances status: Secondary | ICD-10-CM | POA: Insufficient documentation

## 2019-12-24 DIAGNOSIS — R350 Frequency of micturition: Secondary | ICD-10-CM | POA: Diagnosis not present

## 2019-12-24 HISTORY — PX: IR US GUIDE VASC ACCESS RIGHT: IMG2390

## 2019-12-24 HISTORY — PX: IR EMBO TUMOR ORGAN ISCHEMIA INFARCT INC GUIDE ROADMAPPING: IMG5449

## 2019-12-24 LAB — CBC
HCT: 41.6 % (ref 39.0–52.0)
Hemoglobin: 13.9 g/dL (ref 13.0–17.0)
MCH: 30.8 pg (ref 26.0–34.0)
MCHC: 33.4 g/dL (ref 30.0–36.0)
MCV: 92.2 fL (ref 80.0–100.0)
Platelets: 185 10*3/uL (ref 150–400)
RBC: 4.51 MIL/uL (ref 4.22–5.81)
RDW: 14.4 % (ref 11.5–15.5)
WBC: 6.6 10*3/uL (ref 4.0–10.5)
nRBC: 0 % (ref 0.0–0.2)

## 2019-12-24 LAB — BASIC METABOLIC PANEL WITH GFR
Anion gap: 8 (ref 5–15)
BUN: 16 mg/dL (ref 8–23)
CO2: 23 mmol/L (ref 22–32)
Calcium: 8.6 mg/dL — ABNORMAL LOW (ref 8.9–10.3)
Chloride: 103 mmol/L (ref 98–111)
Creatinine, Ser: 1.27 mg/dL — ABNORMAL HIGH (ref 0.61–1.24)
GFR calc Af Amer: >60 mL/min
GFR calc non Af Amer: 53 mL/min — ABNORMAL LOW
Glucose, Bld: 117 mg/dL — ABNORMAL HIGH (ref 70–99)
Potassium: 3.9 mmol/L (ref 3.5–5.1)
Sodium: 134 mmol/L — ABNORMAL LOW (ref 135–145)

## 2019-12-24 LAB — PROTIME-INR
INR: 1.1 (ref 0.8–1.2)
Prothrombin Time: 13.9 seconds (ref 11.4–15.2)

## 2019-12-24 MED ORDER — MIDAZOLAM HCL 2 MG/2ML IJ SOLN
INTRAMUSCULAR | Status: AC | PRN
  Administered 2019-12-24: 0.5 mg via INTRAVENOUS
  Administered 2019-12-24: 1 mg via INTRAVENOUS
  Administered 2019-12-24 (×3): 0.5 mg via INTRAVENOUS

## 2019-12-24 MED ORDER — FENTANYL CITRATE (PF) 100 MCG/2ML IJ SOLN
INTRAMUSCULAR | Status: AC
  Filled 2019-12-24: qty 2

## 2019-12-24 MED ORDER — PHENAZOPYRIDINE HCL 200 MG PO TABS: 200.0000 mg | ORAL_TABLET | Freq: Three times a day (TID) | ORAL | 0 refills | Status: AC

## 2019-12-24 MED ORDER — NITROGLYCERIN 1 MG/10 ML FOR IR/CATH LAB
INTRA_ARTERIAL | Status: AC
  Filled 2019-12-24: qty 10

## 2019-12-24 MED ORDER — IOHEXOL 300 MG/ML  SOLN: 150.0000 mL | Freq: Once | INTRAMUSCULAR | Status: DC | PRN

## 2019-12-24 MED ORDER — CIPROFLOXACIN HCL 500 MG PO TABS: 500.0000 mg | ORAL_TABLET | Freq: Two times a day (BID) | ORAL | 0 refills | Status: AC

## 2019-12-24 MED ORDER — FENTANYL CITRATE (PF) 100 MCG/2ML IJ SOLN
INTRAMUSCULAR | Status: AC | PRN
  Administered 2019-12-24 (×5): 25 ug via INTRAVENOUS

## 2019-12-24 MED ORDER — NITROGLYCERIN 1 MG/10 ML FOR IR/CATH LAB
INTRA_ARTERIAL | Status: AC | PRN
  Administered 2019-12-24: 200 ug via INTRA_ARTERIAL
  Administered 2019-12-24: 100 ug via INTRA_ARTERIAL
  Administered 2019-12-24: 200 ug via INTRA_ARTERIAL

## 2019-12-24 MED ORDER — CIPROFLOXACIN IN D5W 400 MG/200ML IV SOLN
INTRAVENOUS | Status: AC
  Filled 2019-12-24: qty 200

## 2019-12-24 MED ORDER — IBUPROFEN 200 MG PO TABS: 600.0000 mg | ORAL_TABLET | Freq: Three times a day (TID) | ORAL | 0 refills | Status: AC

## 2019-12-24 MED ORDER — MIDAZOLAM HCL 2 MG/2ML IJ SOLN
INTRAMUSCULAR | Status: AC
  Filled 2019-12-24: qty 2

## 2019-12-24 MED ORDER — LIDOCAINE HCL 1 % IJ SOLN
INTRAMUSCULAR | Status: AC
  Filled 2019-12-24: qty 20

## 2019-12-24 MED ORDER — SODIUM CHLORIDE 0.9 % IV SOLN: INTRAVENOUS | Status: DC

## 2019-12-24 MED ORDER — CIPROFLOXACIN IN D5W 400 MG/200ML IV SOLN
400.0000 mg | Freq: Once | INTRAVENOUS | Status: AC
  Administered 2019-12-24: 400 mg via INTRAVENOUS

## 2019-12-24 NOTE — Discharge Instructions (Addendum)
Angiogram, Care After This sheet gives you information about how to care for yourself after your procedure. Your doctor may also give you more specific instructions. If you have problems or questions, contact your doctor. Follow these instructions at home: Insertion site care  Follow instructions from your doctor about how to take care of your long, thin tube (catheter) insertion area. Make sure you: ? Wash your hands with soap and water before you change your bandage (dressing). If you cannot use soap and water, use hand sanitizer. ? Change your bandage as told by your doctor. ? Leave stitches (sutures), skin glue, or skin tape (adhesive) strips in place. They may need to stay in place for 2 weeks or longer. If tape strips get loose and curl up, you may trim the loose edges. Do not remove tape strips completely unless your doctor says it is okay.  Do not take baths, swim, or use a hot tub until your doctor says it is okay.  You may shower 24-48 hours after the procedure or as told by your doctor. ? Gently wash the area with plain soap and water. ? Pat the area dry with a clean towel. ? Do not rub the area. This may cause bleeding.  Do not apply powder or lotion to the area. Keep the area clean and dry.  Check your insertion area every day for signs of infection. Check for: ? More redness, swelling, or pain. ? Fluid or blood. ? Warmth. ? Pus or a bad smell. Activity  Rest as told by your doctor, usually for 1-2 days.  Do not lift anything that is heavier than 10 lbs. (4.5 kg) or as told by your doctor.  Do not drive for 24 hours if you were given a medicine to help you relax (sedative).  Do not drive or use heavy machinery while taking prescription pain medicine. General instructions   Go back to your normal activities as told by your doctor, usually in about a week. Ask your doctor what activities are safe for you.  If the insertion area starts to bleed, lie flat and put  pressure on the area. If the bleeding does not stop, get help right away. This is an emergency.  Drink enough fluid to keep your pee (urine) clear or pale yellow.  Take over-the-counter and prescription medicines only as told by your doctor.  Keep all follow-up visits as told by your doctor. This is important. Contact a doctor if:  You have a fever.  You have chills.  You have more redness, swelling, or pain around your insertion area.  You have fluid or blood coming from your insertion area.  The insertion area feels warm to the touch.  You have pus or a bad smell coming from your insertion area.  You have more bruising around the insertion area.  Blood collects in the tissue around the insertion area (hematoma) that may be painful to the touch. Get help right away if:  You have a lot of pain in the insertion area.  The insertion area swells very fast.  The insertion area is bleeding, and the bleeding does not stop after holding steady pressure on the area.  The area near or just beyond the insertion area becomes pale, cool, tingly, or numb. These symptoms may be an emergency. Do not wait to see if the symptoms will go away. Get medical help right away. Call your local emergency services (911 in the [place] Do not drive yourself to the hospital.   Summary  After the procedure, it is common to have bruising and tenderness at the long, thin tube insertion area.  After the procedure, it is important to rest and drink plenty of fluids.  Do not take baths, swim, or use a hot tub until your doctor says it is okay to do so. You may shower 24-48 hours after the procedure or as told by your doctor.  If the insertion area starts to bleed, lie flat and put pressure on the area. If the bleeding does not stop, get help right away. This is an emergency. This information is not intended to replace advice given to you by your health care provider. Make sure you discuss any questions you have  with your health care provider. Document Revised: 06/13/2017 Document Reviewed: 06/25/2016 Elsevier Patient Education  [place]. Embolization, Care After This sheet gives you information about how to care for yourself after your procedure. Your health care provider may also give you more specific instructions. If you have problems or questions, contact your health care provider. What can I expect after the procedure? After the procedure, it is common to have pain or bruising at the area where your catheter was inserted (insertion site). You may have other symptoms depending on the part of your body that was treated. For example:  Embolization of the arteries in the brain may cause a headache.  Embolization of the arteries in the stomach may cause loss of appetite or nausea. Follow these instructions at home: Insertion site care   If the site starts to bleed, lie down flat and put pressure on the site. If the bleeding does not stop, get help right away. This is a medical emergency.  Follow instructions from your health care provider about how to take care of your insertion site. Make sure you: ? Wash your hands with soap and water before and after you change your bandage (dressing). If soap and water are not available, use hand sanitizer. ? Change your dressing as told by your health care provider. ? Leave stitches (sutures), skin glue, or adhesive strips in place. These skin closures may need to stay in place for 2 weeks or longer. If adhesive strip edges start to loosen and curl up, you may trim the loose edges. Do not remove adhesive strips completely unless your health care provider tells you to do that.  Keep the insertion site clean. To do this: ? Gently wash the site with plain soap and water. ? Pat the area dry with a clean towel. ? Do not rub the site. This may cause bleeding.  Check your insertion site every day for signs of infection. Check for: ? Redness, swelling,  or pain. ? Fluid or blood. ? Warmth. ? Pus or a bad smell.  Do not take baths, swim, or use a hot tub until your health care provider approves. You may be allowed to shower 24-48 hours after the procedure. Activity   Do not lift anything that is heavier than 10 lb (4.5 kg), or the limit that you are told, until your health care provider says that it is safe.  Do not drive for 24 hours if you were given a sedative during your procedure.  Return to your normal activities as told by your health care provider. Ask your health care provider what activities are safe for you. General instructions   Take over-the-counter and prescription medicines only as told by your health care provider.  Ask your health care provider if  the medicine prescribed to you: ? Requires you to avoid driving or using heavy machinery. ? Can cause constipation. You may need to take actions to prevent or treat constipation, such as:  Drink enough fluid to keep your urine pale yellow.  Take over-the-counter or prescription medicines.  Eat foods that are high in fiber, such as beans, whole grains, and fresh fruits and vegetables.  Limit foods that are high in fat and processed sugars, such as fried or sweet foods.  Keep all follow-up visits as told by your health care provider. This is important. Contact a health care provider if:  You have pain that gets worse or does not get better with medicine.  You have a fever.  You have redness, swelling, or pain around your insertion site.  You have fluid or blood coming from your insertion site.  Your insertion site is warm to the touch.  You have pus or a bad smell coming from your insertion site.  You have nausea or vomiting. Get help right away if:  The insertion area swells very quickly.  The insertion area is bleeding, and the bleeding does not stop after you hold steady pressure on the area.  The area near or just beyond the insertion site becomes  pale, cool, tingly, or numb.  You faint or feel like you might faint.  You have chest pain.  You have trouble breathing.  You feel weak or have trouble moving your arms or legs.  You have problems with balance, speech, or vision. These symptoms may represent a serious problem that is an emergency. Do not wait to see if the symptoms will go away. Get medical help right away. Call your local emergency services (911 in the [place] Do not drive yourself to the hospital. Summary  After your procedure, it is common to have pain or bruising at the area where your catheter was inserted (insertion site).  Do not drive for 24 hours if you were given a sedative during your procedure.  Follow instructions from your health care provider about how to take care of your insertion site.  Contact a health care provider if you have a fever or if you have redness, swelling, or pain around your insertion site.  Keep all follow-up visits as told by your health care provider. This is important. This information is not intended to replace advice given to you by your health care provider. Make sure you discuss any questions you have with your health care provider. Document Revised: 11/18/2018 Document Reviewed: 03/02/2018 Elsevier Patient Education  [place]. Moderate Conscious Sedation, Adult Sedation is the use of medicines to promote relaxation and relieve discomfort and anxiety. Moderate conscious sedation is a type of sedation. Under moderate conscious sedation, you are less alert than normal, but you are still able to respond to instructions, touch, or both. Moderate conscious sedation is used during short medical and dental procedures. It is milder than deep sedation, which is a type of sedation under which you cannot be easily woken up. It is also milder than general anesthesia, which is the use of medicines to make you unconscious. Moderate conscious sedation allows you to return to your regular  activities sooner. Tell a health care provider about:  Any allergies you have.  All medicines you are taking, including vitamins, herbs, eye drops, creams, and over-the-counter medicines.  Use of steroids (by mouth or creams).  Any problems you or family members have had with sedatives and anesthetic medicines.  Any  blood disorders you have.  Any surgeries you have had.  Any medical conditions you have, such as sleep apnea.  Whether you are pregnant or may be pregnant.  Any use of cigarettes, alcohol, marijuana, or street drugs. What are the risks? Generally, this is a safe procedure. However, problems may occur, including:  Getting too much medicine (oversedation).  Nausea.  Allergic reaction to medicines.  Trouble breathing. If this happens, a breathing tube may be used to help with breathing. It will be removed when you are awake and breathing on your own.  Heart trouble.  Lung trouble. What happens before the procedure? Staying hydrated Follow instructions from your health care provider about hydration, which may include:  Up to 2 hours before the procedure - you may continue to drink clear liquids, such as water, clear fruit juice, black coffee, and plain tea. Eating and drinking restrictions Follow instructions from your health care provider about eating and drinking, which may include:  8 hours before the procedure - stop eating heavy meals or foods such as meat, fried foods, or fatty foods.  6 hours before the procedure - stop eating light meals or foods, such as toast or cereal.  6 hours before the procedure - stop drinking milk or drinks that contain milk.  2 hours before the procedure - stop drinking clear liquids. Medicine Ask your health care provider about:  Changing or stopping your regular medicines. This is especially important if you are taking diabetes medicines or blood thinners.  Taking medicines such as aspirin and ibuprofen. These medicines  can thin your blood. Do not take these medicines before your procedure if your health care provider instructs you not to.  Tests and exams  You will have a physical exam.  You may have blood tests done to show: ? How well your kidneys and liver are working. ? How well your blood can clot. General instructions  Plan to have someone take you home from the hospital or clinic.  If you will be going home right after the procedure, plan to have someone with you for 24 hours. What happens during the procedure?  An IV tube will be inserted into one of your veins.  Medicine to help you relax (sedative) will be given through the IV tube.  The medical or dental procedure will be performed. What happens after the procedure?  Your blood pressure, heart rate, breathing rate, and blood oxygen level will be monitored often until the medicines you were given have worn off.  Do not drive for 24 hours. This information is not intended to replace advice given to you by your health care provider. Make sure you discuss any questions you have with your health care provider. Document Revised: 06/13/2017 Document Reviewed: 10/21/2015 Elsevier Patient Education  2020 [employer].

## 2019-12-24 NOTE — Sedation Documentation (Addendum)
Patient groin site assessed at the bedside with [facility] RN. Groin site is pink, warm and dry. Soft to touch, no hematoma via palpation. +2 pulses palpated bilaterally. Patient v/s back to baseline. Alert and oriented at this time. Hx of dementia

## 2019-12-24 NOTE — H&P (Signed)
Chief Complaint: Patient was seen in consultation today for bilateral prostate artery embolization.  Referring Physician(s): John Giovanni  Supervising Physician: Jacqulynn Cadet  Patient Status: [facility] - Out-pt  History of Present Illness: Russell Terry is a 79 y.o. male with a past medical history significant for dementia, DDD, COPD, HTN, HLD, renal call carcinoma s/p right nephrectomy, a.fib and BPH who presents today for bilateral prostate artery embolization. Russell Terry was seen in consultation with Dr. Laurence Ferrari on 11/23/19 - please see this note for full details. Briefly, Russell Terry has had BPH with lower urinary tract symptoms including frequent urination, urgency and nocturia for 10-15 years. He has had 2 prior episodes of spontaneous hematuria resulting in clot retention and requiring catheterization and continuous bladder irrigation. He was evaluated by urology and have further workup was deemed to no be a candidate for TURP or other interventions. After discussion with Dr. Laurence Ferrari he was deemed to be a good candidate for bilateral prostate artery for which he presents today.  Russell Terry was seen with his wife today in IR holding, he reports low back pain which is chronic and unchanged. He did have a headache and feel nauseous last night which he wife thinks is due to nerves, he does not have any nausea or headaches currently. He has not seen any blood in his urine that he is aware of. He and his wife both state understanding of the requested procedure and are agreeable to proceed as planned.  Past Medical History:  Diagnosis Date  . Atrial fibrillation ([place]) 12/11/2013  . BPH (benign prostatic hyperplasia)   . Bradycardia 12/11/2013  . Calculus of kidney 12/06/2013  . Cancer ([place])    rt kidney removed  . Chronic kidney disease    stones,rt kidney removed  . COPD (chronic obstructive pulmonary disease) ([place]) 12/11/2013  . DDD (degenerative disc disease),  lumbar 12/13/2013  . Delirium   . Dementia ([place])   . Elevated prostate specific antigen (PSA) 03/03/2013  . Encephalopathy acute 07/22/2016  . Enlarged prostate with lower urinary tract symptoms (LUTS) 03/03/2013  . Essential hypertension 07/22/2016  . Hyperlipidemia, unspecified 07/09/2017  . Hypertension   . Meningioma ([place]) 10/08/2016  . Parkinsonian features 10/08/2016  . Pleuritic chest pain 05/31/2014  . Renal cell carcinoma ([place]) 05/04/2016  . Renal cyst, acquired, left 04/22/2015    Past Surgical History:  Procedure Laterality Date  . BRAIN SURGERY  2017  . COLON SURGERY    . COLONOSCOPY WITH PROPOFOL N/A 12/31/2016   Procedure: COLONOSCOPY WITH PROPOFOL;  Surgeon: Lollie Sails, MD;  Location: [facility] ENDOSCOPY;  Service: Endoscopy;  Laterality: N/A;  . [place]    . IR RADIOLOGIST EVAL & MGMT  11/23/2019  . LUMBAR LAMINECTOMY/DECOMPRESSION MICRODISCECTOMY N/A 10/18/2015   Procedure: LUMBAR LAMINECTOMY/DECOMPRESSION MICRODISCECTOMY 3 LEVELS;  Surgeon: Newman Pies, MD;  Location: [place] NEURO ORS;  Service: Neurosurgery;  Laterality: N/A;  L23 L34 L45 laminectomy and foraminotomy  . NEPHRECTOMY     2012  . URETEROSCOPY WITH HOLMIUM LASER LITHOTRIPSY      Allergies: Quinapril, Ambien [zolpidem tartrate], Fenofibrate, Amoxicillin-pot clavulanate, and Morphine  Medications: Prior to Admission medications   Medication Sig Start Date End Date Taking? Authorizing Provider  acetaminophen (TYLENOL) 325 MG tablet Take 2 tablets (650 mg total) by mouth every 6 (six) hours as needed for mild pain (or Fever >/= 101). 04/25/19  Yes Ojie, Jude, MD  albuterol (VENTOLIN HFA) 108 (90 Base) MCG/ACT inhaler Inhale 2 puffs into the  lungs every 6 (six) hours as needed for wheezing or shortness of breath.  01/11/19  Yes [provider]  aspirin EC 81 MG tablet Take 81 mg by mouth daily.   Yes [provider]  carbidopa-levodopa (SINEMET CR) 50-200 MG tablet Take 1  tablet by mouth 2 (two) times daily.   Yes [provider]  carbidopa-levodopa (SINEMET IR) 25-100 MG tablet Take 2 tablets by mouth 3 (three) times daily.  05/03/17  Yes [provider]  cetirizine (ZYRTEC) 10 MG tablet Take 10 mg by mouth daily.   Yes [provider]  diltiazem (CARDIZEM CD) 240 MG 24 hr capsule Take 240 mg by mouth daily.    Yes [provider]  donepezil (ARICEPT) 10 MG tablet Take 10 mg by mouth every evening.    Yes [provider]  doxazosin (CARDURA) 4 MG tablet Take 4 mg by mouth daily.   Yes [provider]  dutasteride (AVODART) 0.5 MG capsule Take 0.5 mg by mouth daily.   Yes [provider]  gabapentin (NEURONTIN) 100 MG capsule Take 100 mg by mouth at bedtime.  07/30/19  Yes [provider]  Melatonin 5 MG TABS Take 5 mg by mouth at bedtime.   Yes [provider]  polyethylene glycol (MIRALAX / GLYCOLAX) 17 g packet Take 17 g by mouth daily as needed for moderate constipation.   Yes [provider]  tamsulosin (FLOMAX) 0.4 MG CAPS capsule Take 0.4 mg by mouth 2 (two) times daily.   Yes [provider]  traZODone (DESYREL) 100 MG tablet Take 200 mg by mouth at bedtime.  07/04/18  Yes [provider]  venlafaxine XR (EFFEXOR-XR) 75 MG 24 hr capsule Take 75 mg by mouth at bedtime.  04/14/18  Yes [provider]     Family History  Problem Relation Age of Onset  . Bladder Cancer Neg Hx   . Kidney cancer Neg Hx   . Prolactinoma Neg Hx   . Prostate cancer Neg Hx     Social History   Socioeconomic History  . Marital status: Married    Spouse name: Not on file  . Number of children: Not on file  . Years of education: Not on file  . Highest education level: Not on file  Occupational History  . Not on file  Tobacco Use  . Smoking status: Current Every Day Smoker    Packs/day: 1.00    Years: 50.00    Pack years: 50.00  . Smokeless tobacco: Never  Used  Vaping Use  . Vaping Use: Never used  Substance and Sexual Activity  . Alcohol use: No  . Drug use: No  . Sexual activity: Yes    Birth control/protection: None  Other Topics Concern  . Not on file  Social History Narrative  . Not on file   Social Determinants of Health   Financial Resource Strain: Low Risk   . Difficulty of Paying Living Expenses: Not hard at all  Food Insecurity: No Food Insecurity  . Worried About Charity fundraiser in the Last Year: Never true  . Ran Out of Food in the Last Year: Never true  Transportation Needs: No Transportation Needs  . Lack of Transportation (Medical): No  . Lack of Transportation (Non-Medical): No  Physical Activity: Inactive  . Days of Exercise per Week: 0 days  . Minutes of Exercise per Session: 0 min  Stress: No Stress Concern Present  . Feeling of Stress :  Not at all  Social Connections: Moderately Integrated  . Frequency of Communication with Friends and Family: More than three times a week  . Frequency of Social Gatherings with Friends and Family: More than three times a week  . Attends Religious Services: More than 4 times per year  . Active Member of Clubs or Organizations: No  . Attends Archivist Meetings: Never  . Marital Status: Married     Review of Systems: A 12 point ROS discussed and pertinent positives are indicated in the HPI above.  All other systems are negative.  Review of Systems  Constitutional: Negative for chills and fever.  Respiratory: Negative for cough and shortness of breath.   Cardiovascular: Negative for chest pain.  Gastrointestinal: Negative for abdominal pain, diarrhea, nausea and vomiting.  Genitourinary: Positive for difficulty urinating. Negative for hematuria.  Musculoskeletal: Negative for back pain.  Neurological: Negative for dizziness and headaches.    Vital Signs: BP (!) 148/97   Pulse 74   Temp 98 F (36.7 C) (Oral)   Resp 16   Ht 5' 9.5" (1.765 m)   Wt 175  lb (79.4 kg)   SpO2 96%   BMI 25.47 kg/m   Physical Exam Vitals reviewed.  Constitutional:      General: He is not in acute distress. HENT:     Head: Normocephalic.     Mouth/Throat:     Mouth: Mucous membranes are moist.     Pharynx: Oropharynx is clear. No oropharyngeal exudate or posterior oropharyngeal erythema.  Cardiovascular:     Rate and Rhythm: Normal rate and regular rhythm.  Pulmonary:     Effort: Pulmonary effort is normal.     Breath sounds: Normal breath sounds.  Abdominal:     General: There is no distension.     Palpations: Abdomen is soft.     Tenderness: There is no abdominal tenderness.  Skin:    General: Skin is warm and dry.  Neurological:     Mental Status: He is alert. Mental status is at baseline.  Psychiatric:        Mood and Affect: Mood normal.        Behavior: Behavior normal.        Thought Content: Thought content normal.        Judgment: Judgment normal.      MD Evaluation Airway: WNL Heart: WNL Abdomen: WNL Chest/ Lungs: WNL ASA  Classification: 3 Mallampati/Airway Score: Two   Imaging: CT ANGIO PELVIS W OR WO CONTRAST  Result Date: 12/02/2019 CLINICAL DATA:  79 year old male with a history urinary retention, BPH, presents for preoperative CT angiogram. EXAM: CT ANGIOGRAPHY PELVIS TECHNIQUE: Multidetector CT imaging of pelvis was performed using the standard protocol during bolus administration of intravenous contrast. Multiplanar reconstructed images and MIPs were obtained and reviewed to evaluate the vascular anatomy. CONTRAST:  61mL OMNIPAQUE IOHEXOL 350 MG/ML SOLN COMPARISON:  04/21/2019 FINDINGS: CTA PELVIS FINDINGS VASCULAR Aorta: Visualized infrarenal abdominal aorta with mild atherosclerotic changes. Mild tortuosity. No ulcerated plaque or aneurysm. There is accessory left renal artery originating from the low infrarenal abdominal aorta, beyond the IMA origin, to the lower segment of the left kidney. IMA: IMA is patent. Right  lower extremity: Mild tortuosity of the right iliac system. Mild atherosclerotic changes of the common iliac artery without high-grade stenosis or occlusion. External iliac artery patent without significant atherosclerosis. Common femoral artery patent with minimal atherosclerosis. Proximal profunda femoris and SFA patent. Inferior epigastric artery is patent with no evidence  of a replaced or aberrant obturator artery The internal iliac artery is patent with minimal atherosclerosis at the origin. Posterior division patent into the superior gluteal artery. There appears to be a proximal origin of the obturator artery from the superior gluteal artery. Shared origin with the superior traveling iliolumbar artery. The presumed origin of the dominant prostatic arteries is the obturator artery. The only anterior division artery that appears patent is the internal pudendal artery which has attenuated course through the pelvis. Left lower extremity: Tortuosity of the left iliac system. Minimal atherosclerosis of the common iliac artery without high-grade stenosis or occlusion. External iliac artery is patent. Common femoral artery with minimal atherosclerosis. Proximal profunda femoris and SFA patent. Inferior epigastric artery is patent with no evidence of a replaced or aberrant obturator artery. Internal iliac artery is patent with minimal atherosclerosis at the origin. No evidence of high-grade stenosis. Posterior division is the dominant division into the superior gluteal artery. Obturator artery originates from the anterior division, and is patent with mild attenuation. The internal pudendal artery originates near the obturator artery origin and is patent through the pelvis. Veins: Unremarkable Review of the MIP images confirms the above findings. NON-VASCULAR Stomach/Bowel: Moderate stool burden. Colonic diverticula of the visualized colon. No focal inflammatory changes. Unremarkable appearance of the visualized small  bowel which is relatively decompressed. Incomplete visualization of the left kidney with redemonstration of nonobstructing nephrolithiasis in the lower pole. Lymphatic: No adenopathy. Reproductive: Prostate diameter measures 6.3 cm on the axial CT images. Internal calcifications. Prominent median lobe at the base of the bladder. Since the prior study there has been resolution of the intraluminal urinary bladder hemorrhage. Relatively decompressed urinary bladder. Other: Small fat containing umbilical hernia. Musculoskeletal: No acute displaced fracture. Degenerative changes of the lower lumbar spine. Vacuum disc phenomenon of L3-S1. Broad-based disc bulge at L3-L4 likely narrows the canal and the bilateral foramen. No bony canal narrowing. Degenerative changes at the hips. Review of the MIP images confirms the above findings. IMPRESSION: Prostatomegaly. Interval resolution of urinary bladder hemorrhage/hematoma. Minimal atherosclerosis and bilateral iliac arterial disease, without high-grade stenosis or occlusion. Aortic Atherosclerosis (ICD10-I70.0). Ancillary findings as above. Signed, Dulcy Fanny. Dellia Nims, RPVI [facility] Radiology Electronically Signed   By: Corrie Mckusick D.O.   On: 12/02/2019 15:40    Labs:  CBC: Recent Labs    04/21/19 0925 04/22/19 0631 04/24/19 0429 04/25/19 0512  WBC 7.0 8.2 10.3 6.2  HGB 13.1 10.1* 9.3* 9.0*  HCT 37.6* 29.8* 27.2* 26.3*  PLT 183 177 162 180    COAGS: No results for input(s): INR, APTT in the last 8760 hours.  BMP: Recent Labs    04/21/19 0925 04/21/19 0925 04/22/19 0631 04/24/19 0429 04/25/19 0512 12/02/19 0802  NA 135  --  136 136 137  --   K 4.2  --  3.8 3.4* 3.3*  --   CL 101  --  104 106 107  --   CO2 25  --  27 25 24   --   GLUCOSE 118*  --  94 121* 102*  --   BUN 19  --  22 11 10   --   CALCIUM 9.0  --  7.9* 8.4* 8.3*  --   CREATININE 1.46*   < > 1.63* 1.07 1.04 1.40*  GFRNONAA 45*   --  39* >60 >60  --   GFRAA 52*  --  46* >60 >60  --    < > = values in  this interval not displayed.    LIVER FUNCTION TESTS: Recent Labs    01/05/19 1558  BILITOT 1.2  AST 14*  ALT 5  ALKPHOS 61  PROT 6.5  ALBUMIN 4.0    TUMOR MARKERS: No results for input(s): AFPTM, CEA, CA199, CHROMGRNA in the last 8760 hours.  Assessment and Plan:  79 y/o M with history of BPH with lower urinary tract symptoms for 10-15 years deemed to not be a surgical candidate by urology who presents today for a bilateral prostate artery embolization as previously discussed with Dr. Laurence Ferrari on 11/23/19.  Patient has been NPO since midnight, no current anticoagulation/antiplatelet medication besides ASA 81 mg QD. Afebrile, pre-procedure labs pending and will be reviewed prior to proceeding.  Risks and benefits of bilateral prostate artery arteriogram with intervention were discussed with the patient including, but not limited to bleeding, infection, vascular injury, contrast induced renal failure, stroke, reperfusion hemorrhage, or even death. This interventional procedure involves the use of X-rays and because of the nature of the planned procedure, it is possible that we will have prolonged use of X-ray fluoroscopy.  Potential radiation risks to you include (but are not limited to) the following: - A slightly elevated risk for cancer  several years later in life. This risk is typically less than 0.5% percent. This risk is low in comparison to the normal incidence of human cancer, which is 33% for women and 50% for men according to the [place]. - Radiation induced injury can include skin redness, resembling a rash, tissue breakdown / ulcers and hair loss (which can be temporary or permanent).   The likelihood of either of these occurring depends on the difficulty of the procedure and whether you are sensitive to radiation due to previous procedures, disease, or genetic conditions.   IF your  procedure requires a prolonged use of radiation, you will be notified and given written instructions for further action.  It is your responsibility to monitor the irradiated area for the 2 weeks following the procedure and to notify your physician if you are concerned that you have suffered a radiation induced injury.    All of the patient's questions were answered, patient is agreeable to proceed.  Consent signed and in chart.  Thank you for this interesting consult.  I greatly enjoyed meeting Russell Terry and look forward to participating in their care.  A copy of this report was sent to the requesting provider on this date.  Electronically Signed: Joaquim Nam, PA-C 12/24/2019, 7:33 AM   I spent a total of  30 Minutes   in face to face in clinical consultation, greater than 50% of which was counseling/coordinating care for bilateral prostate artery embolization.

## 2019-12-24 NOTE — Procedures (Signed)
Interventional Radiology Procedure Note  Procedure: Bilateral PAE  Complications: None  Estimated Blood Loss: None  Recommendations: - Bedrest x 4 hrs - DC Home   Signed,  Criselda Peaches, MD

## 2019-12-27 ENCOUNTER — Encounter (HOSPITAL_COMMUNITY): Payer: Self-pay

## 2019-12-27 ENCOUNTER — Other Ambulatory Visit (HOSPITAL_COMMUNITY): Payer: Self-pay | Admitting: Interventional Radiology

## 2019-12-27 DIAGNOSIS — F0390 Unspecified dementia without behavioral disturbance: Secondary | ICD-10-CM | POA: Diagnosis not present

## 2019-12-27 DIAGNOSIS — N400 Enlarged prostate without lower urinary tract symptoms: Secondary | ICD-10-CM

## 2020-01-11 ENCOUNTER — Ambulatory Visit
Admission: RE | Admit: 2020-01-11 | Discharge: 2020-01-11 | Disposition: A | Discharge status: Home / Self Care | Payer: PPO | Source: Ambulatory Visit | Attending: Student | Admitting: Student

## 2020-01-11 ENCOUNTER — Other Ambulatory Visit: Payer: Self-pay

## 2020-01-11 ENCOUNTER — Encounter: Payer: Self-pay | Admitting: *Deleted

## 2020-01-11 DIAGNOSIS — N401 Enlarged prostate with lower urinary tract symptoms: Secondary | ICD-10-CM

## 2020-01-11 DIAGNOSIS — M542 Cervicalgia: Secondary | ICD-10-CM

## 2020-01-11 DIAGNOSIS — N400 Enlarged prostate without lower urinary tract symptoms: Secondary | ICD-10-CM | POA: Diagnosis not present

## 2020-01-11 HISTORY — PX: IR RADIOLOGIST EVAL & MGMT: IMG5224

## 2020-01-11 NOTE — Progress Notes (Signed)
Chief Complaint: Patient was consulted remotely today (TeleHealth) for BPH with LUTS and prior hematuria at the request of Covington,Jamie R.    Referring Physician(s): Stoioff, Scott C.  History of Present Illness: Russell Terry is a 79 y.o. male with a history of benign prostatic hyperplasia complicated by lower urinary tract symptoms as well as spontaneous hematuria resulting in clot retention and necessitating hospitalization with catheterization and continuous bladder irrigation.  He underwent bilateral prostatic artery embolization on 12/24/2019.  I spoke with he and his wife today via telemedicine nearly 3 weeks post procedure.  The majority of his history is provided by his wife due to his underlying dementia.  Mrs. Polinsky reports that in the initial 7 to 10 days following the procedure she noticed an exacerbation of his underlying dementia, however this seemed to improve daily.  He has now very nearly at his baseline and doing much better.  In the first 2 weeks following the procedure he did complain of some dysuria, specifically burning with urination.  Over-the-counter phenazopyridine was very helpful for the symptoms.  He last required this medication approximately 4 days ago.  He has experienced no episodes of hematuria.  She believes he has been getting up less frequently at night to urinate as well which is an improvement.  Overall, she is quite happy with his clinical course thus far and is looking forward to further relief of his symptoms.  The arterial access site in his groin reportedly looks good.  No evidence of cellulitis, swelling or tenderness.  He has had no issues with fever, chills, or cloudy urine.  Past Medical History:  Diagnosis Date  . Atrial fibrillation ([place]) 12/11/2013  . BPH (benign prostatic hyperplasia)   . Bradycardia 12/11/2013  . Calculus of kidney 12/06/2013  . Cancer ([place])    rt kidney removed  . Chronic kidney disease    stones,rt  kidney removed  . COPD (chronic obstructive pulmonary disease) ([place]) 12/11/2013  . DDD (degenerative disc disease), lumbar 12/13/2013  . Delirium   . Dementia ([place])   . Elevated prostate specific antigen (PSA) 03/03/2013  . Encephalopathy acute 07/22/2016  . Enlarged prostate with lower urinary tract symptoms (LUTS) 03/03/2013  . Essential hypertension 07/22/2016  . Hyperlipidemia, unspecified 07/09/2017  . Hypertension   . Meningioma ([place]) 10/08/2016  . Parkinsonian features 10/08/2016  . Pleuritic chest pain 05/31/2014  . Renal cell carcinoma ([place]) 05/04/2016  . Renal cyst, acquired, left 04/22/2015    Past Surgical History:  Procedure Laterality Date  . BRAIN SURGERY  2017  . COLON SURGERY    . COLONOSCOPY WITH PROPOFOL N/A 12/31/2016   Procedure: COLONOSCOPY WITH PROPOFOL;  Surgeon: Lollie Sails, MD;  Location[facility] ENDOSCOPY;  Service: Endoscopy;  Laterality: N/A;  . [place]    . IR ANGIOGRAM PELVIS SELECTIVE OR SUPRASELECTIVE  12/24/2019  . IR ANGIOGRAM PELVIS SELECTIVE OR SUPRASELECTIVE  12/24/2019  . IR ANGIOGRAM SELECTIVE EACH ADDITIONAL VESSEL  12/24/2019  . IR ANGIOGRAM SELECTIVE EACH ADDITIONAL VESSEL  12/24/2019  . IR ANGIOGRAM SELECTIVE EACH ADDITIONAL VESSEL  12/24/2019  . IR EMBO ARTERIAL NOT HEMORR HEMANG INC GUIDE ROADMAPPING  12/24/2019  . IR RADIOLOGIST EVAL & MGMT  11/23/2019  . IR US GUIDE VASC ACCESS RIGHT  12/24/2019  . LUMBAR LAMINECTOMY/DECOMPRESSION MICRODISCECTOMY N/A 10/18/2015   Procedure: LUMBAR LAMINECTOMY/DECOMPRESSION MICRODISCECTOMY 3 LEVELS;  Surgeon: Newman Pies, MD;  Location: [place] NEURO ORS;  Service: Neurosurgery;  Laterality: N/A;  L23 L34 L45 laminectomy and  foraminotomy  . NEPHRECTOMY     2012  . URETEROSCOPY WITH HOLMIUM LASER LITHOTRIPSY      Allergies: Quinapril, Ambien [zolpidem tartrate], Fenofibrate, Amoxicillin-pot clavulanate, and Morphine  Medications: Prior to Admission medications   Medication Sig Start Date End  Date Taking? Authorizing Provider  acetaminophen (TYLENOL) 325 MG tablet Take 2 tablets (650 mg total) by mouth every 6 (six) hours as needed for mild pain (or Fever >/= 101). 04/25/19   Stark Jock Jude, MD  albuterol (VENTOLIN HFA) 108 (90 Base) MCG/ACT inhaler Inhale 2 puffs into the lungs every 6 (six) hours as needed for wheezing or shortness of breath.  01/11/19   [provider]  aspirin EC 81 MG tablet Take 81 mg by mouth daily.    [provider]  carbidopa-levodopa (SINEMET CR) 50-200 MG tablet Take 1 tablet by mouth 2 (two) times daily.    [provider]  carbidopa-levodopa (SINEMET IR) 25-100 MG tablet Take 2 tablets by mouth 3 (three) times daily.  05/03/17   [provider]  cetirizine (ZYRTEC) 10 MG tablet Take 10 mg by mouth daily.    [provider]  diltiazem (CARDIZEM CD) 240 MG 24 hr capsule Take 240 mg by mouth daily.     [provider]  donepezil (ARICEPT) 10 MG tablet Take 10 mg by mouth every evening.     [provider]  doxazosin (CARDURA) 4 MG tablet Take 4 mg by mouth daily.    [provider]  dutasteride (AVODART) 0.5 MG capsule Take 0.5 mg by mouth daily.    [provider]  gabapentin (NEURONTIN) 100 MG capsule Take 100 mg by mouth at bedtime.  07/30/19   [provider]  Melatonin 5 MG TABS Take 5 mg by mouth at bedtime.    [provider]  polyethylene glycol (MIRALAX / GLYCOLAX) 17 g packet Take 17 g by mouth daily as needed for moderate constipation.    [provider]  tamsulosin (FLOMAX) 0.4 MG CAPS capsule Take 0.4 mg by mouth 2 (two) times daily.    [provider]  traZODone (DESYREL) 100 MG tablet Take 200 mg by mouth at bedtime.  07/04/18   [provider]  venlafaxine XR (EFFEXOR-XR) 75 MG 24 hr capsule Take 75 mg by mouth at bedtime.  04/14/18   [provider]     Family History  Problem Relation Age of Onset  . Bladder  Cancer Neg Hx   . Kidney cancer Neg Hx   . Prolactinoma Neg Hx   . Prostate cancer Neg Hx     Social History   Socioeconomic History  . Marital status: Married    Spouse name: Not on file  . Number of children: Not on file  . Years of education: Not on file  . Highest education level: Not on file  Occupational History  . Not on file  Tobacco Use  . Smoking status: Current Every Day Smoker    Packs/day: 1.00    Years: 50.00    Pack years: 50.00  . Smokeless tobacco: Never Used  Vaping Use  . Vaping Use: Never used  Substance and Sexual Activity  . Alcohol use: No  . Drug use: No  . Sexual activity: Yes    Birth control/protection: None  Other Topics Concern  . Not on file  Social History Narrative  . Not on file   Social Determinants of Health   Financial Resource Strain: Low Risk   .  Difficulty of Paying Living Expenses: Not hard at all  Food Insecurity: No Food Insecurity  . Worried About Charity fundraiser in the Last Year: Never true  . Ran Out of Food in the Last Year: Never true  Transportation Needs: No Transportation Needs  . Lack of Transportation (Medical): No  . Lack of Transportation (Non-Medical): No  Physical Activity: Inactive  . Days of Exercise per Week: 0 days  . Minutes of Exercise per Session: 0 min  Stress: No Stress Concern Present  . Feeling of Stress : Not at all  Social Connections: Moderately Integrated  . Frequency of Communication with Friends and Family: More than three times a week  . Frequency of Social Gatherings with Friends and Family: More than three times a week  . Attends Religious Services: More than 4 times per year  . Active Member of Clubs or Organizations: No  . Attends Archivist Meetings: Never  . Marital Status: Married    Review of Systems  Review of Systems: A 12 point ROS discussed and pertinent positives are indicated in the HPI above.  All other systems are negative.  Physical Exam No direct  physical exam was performed (except for noted visual exam findings with Video Visits).   Vital Signs: There were no vitals taken for this visit.  Imaging: IR Angiogram Pelvis Selective Or Supraselective  Result Date: 12/24/2019 INDICATION: 79 year old male with benign prostatic hyperplasia complicated by recurrent hematuria and lower urinary tract symptoms. He presents for elective prostate artery embolization. EXAM: IR EMBO ARTERIAL NOT HEMORR HEMANG INC GUIDE ROADMAPPING; ADDITIONAL ARTERIOGRAPHY; PELVIC SELECTIVE ARTERIOGRAPHY; ARTERIOGRAPHY 1. Ultrasound-guided access right common femoral artery 2. Catheterization of the left internal iliac artery with arteriogram 3. Catheterization the anterior division of the left internal iliac artery with arteriogram 4. Catheterization of the left prostatic artery with arteriogram 5. Particle embolization of the left prostatic artery 6. Catheterization the right internal iliac artery with arteriogram 7. Catheterization of the anterior division of the right internal iliac artery with arteriogram 8. Catheterization of the right obturator artery with arteriogram 9. Catheterization of the right prostatic artery with arteriogram 10. Particle embolization of the right prostatic artery MEDICATIONS: 400 mg Cipro, 500 mcg nitroglycerin. The antibiotic was administered within 1 hour of the procedure ANESTHESIA/SEDATION: Moderate (conscious) sedation was employed during this procedure. A total of Versed 3 mg and Fentanyl 125 mcg was administered intravenously. Moderate Sedation Time: 107 minutes. The patient's level of consciousness and vital signs were monitored continuously by radiology nursing throughout the procedure under my direct supervision. CONTRAST:  75 mL Isovue-300 FLUOROSCOPY TIME:  Fluoroscopy Time: 25 minutes 18 seconds (3395 mGy). COMPLICATIONS: None immediate. PROCEDURE: Informed consent was obtained from the patient following explanation of the procedure,  risks, benefits and alternatives. The patient understands, agrees and consents for the procedure. All questions were addressed. A time out was performed prior to the initiation of the procedure. Maximal barrier sterile technique utilized including caps, mask, sterile gowns, sterile gloves, large sterile drape, hand hygiene, and Betadine prep. The right common femoral artery was interrogated with ultrasound and found to be widely patent. An image was obtained and stored for the medical record. Local anesthesia was attained by infiltration with 1% lidocaine. A small dermatotomy was made. Under real-time sonographic guidance, the vessel was punctured with a 21 gauge micropuncture needle. Using standard technique, the initial micro needle was exchanged over a 0.018 micro wire for a transitional 4 [place] micro sheath. The micro sheath was  then exchanged over a 0.035 wire for a 5 [place] vascular sheath. A C2 cobra catheter was advanced up in over the aortic bifurcation and used to select the left internal iliac artery. Arteriography was then performed in multiple obliquities. The anterior division of the left internal iliac artery is relatively short and provides a common origin for the obturator, internal pudendal, umbilical, superior vesicular and prosthetic arteries. A Progreat outflow microcatheter was successfully navigated into the proximal trunk of the anterior division. Additional arteriography was performed under greater magnification and in different obliquities in order to isolate the origin of the left prosthetic artery. The microcatheter was then successfully navigated into the prosthetic artery utilizing a Fathom 16 wire. Arteriography was performed demonstrating an excellent parenchymal blush within the prostate gland. Cone beam CT angiography was then performed confirming excellent perfusion of the left hemi prostate gland. There was no evidence of non target perfusion. Therefore, particle embolization was  then performed to near stasis utilizing 200 micron Terumo Hydrapearls. Just prior to particle injection, 200 mcg of nitroglycerin was administered. Once near stasis was achieved, the distal prostatic artery was coil embolized using 0.018 Terumo Azure microcoils. A 2 x 2, and 2 x 4 detachable micro coil were placed. The microcatheter was then removed. The C2 cobra catheter was then formed into a Waltman loop and used to select the ipsilateral right internal iliac artery. Arteriography was performed. Anatomy is different on the right-side versus the left. The anterior division consists of a common origin of the umbilical artery and internal pudendal artery. The obturator artery arises proximally from the posterior division and appears to give rise to the prosthetic artery. The Progreat outflow microcatheter was successfully advanced into the obturator artery. Arteriography was performed confirming that the prosthetic artery and superior vesicular artery arise from the mid aspect of the obturator artery. The microcatheter was successfully advanced into the right prostatic artery. Arteriography was performed confirming excellent tumor blush. Cone beam CT angiography was then performed further confirming perfusion of the right hemi prostate gland without evidence of non target perfusion. Nitroglycerin was administered into the distal prosthetic artery. Particle embolization was then performed to near stasis again using 200 micron spheres. The distal prosthetic artery was then coil embolized using a combination of 2 and 3 mm detachable microcoils. The catheters were removed. A limited right common femoral arteriogram confirms common femoral arterial access. Hemostasis was attained with the assistance of an Angio-Seal extra arterial vascular plug. IMPRESSION: 1. Successful bilateral prostate artery embolization. Signed, Criselda Peaches, MD, [place] [facility] Radiology  Electronically Signed   By: Jacqulynn Cadet M.D.   On: 12/24/2019 12:09   IR Angiogram Pelvis Selective Or Supraselective  Result Date: 12/24/2019 INDICATION: 79 year old male with benign prostatic hyperplasia complicated by recurrent hematuria and lower urinary tract symptoms. He presents for elective prostate artery embolization. EXAM: IR EMBO ARTERIAL NOT HEMORR HEMANG INC GUIDE ROADMAPPING; ADDITIONAL ARTERIOGRAPHY; PELVIC SELECTIVE ARTERIOGRAPHY; ARTERIOGRAPHY 1. Ultrasound-guided access right common femoral artery 2. Catheterization of the left internal iliac artery with arteriogram 3. Catheterization the anterior division of the left internal iliac artery with arteriogram 4. Catheterization of the left prostatic artery with arteriogram 5. Particle embolization of the left prostatic artery 6. Catheterization the right internal iliac artery with arteriogram 7. Catheterization of the anterior division of the right internal iliac artery with arteriogram 8. Catheterization of the right obturator artery with arteriogram 9. Catheterization of the right prostatic artery with arteriogram 10. Particle embolization of  the right prostatic artery MEDICATIONS: 400 mg Cipro, 500 mcg nitroglycerin. The antibiotic was administered within 1 hour of the procedure ANESTHESIA/SEDATION: Moderate (conscious) sedation was employed during this procedure. A total of Versed 3 mg and Fentanyl 125 mcg was administered intravenously. Moderate Sedation Time: 107 minutes. The patient's level of consciousness and vital signs were monitored continuously by radiology nursing throughout the procedure under my direct supervision. CONTRAST:  75 mL Isovue-300 FLUOROSCOPY TIME:  Fluoroscopy Time: 25 minutes 18 seconds (3395 mGy). COMPLICATIONS: None immediate. PROCEDURE: Informed consent was obtained from the patient following explanation of the procedure, risks, benefits and alternatives. The patient understands, agrees and consents for the  procedure. All questions were addressed. A time out was performed prior to the initiation of the procedure. Maximal barrier sterile technique utilized including caps, mask, sterile gowns, sterile gloves, large sterile drape, hand hygiene, and Betadine prep. The right common femoral artery was interrogated with ultrasound and found to be widely patent. An image was obtained and stored for the medical record. Local anesthesia was attained by infiltration with 1% lidocaine. A small dermatotomy was made. Under real-time sonographic guidance, the vessel was punctured with a 21 gauge micropuncture needle. Using standard technique, the initial micro needle was exchanged over a 0.018 micro wire for a transitional 4 [place] micro sheath. The micro sheath was then exchanged over a 0.035 wire for a 5 French vascular sheath. A C2 cobra catheter was advanced up in over the aortic bifurcation and used to select the left internal iliac artery. Arteriography was then performed in multiple obliquities. The anterior division of the left internal iliac artery is relatively short and provides a common origin for the obturator, internal pudendal, umbilical, superior vesicular and prosthetic arteries. A Progreat outflow microcatheter was successfully navigated into the proximal trunk of the anterior division. Additional arteriography was performed under greater magnification and in different obliquities in order to isolate the origin of the left prosthetic artery. The microcatheter was then successfully navigated into the prosthetic artery utilizing a Fathom 16 wire. Arteriography was performed demonstrating an excellent parenchymal blush within the prostate gland. Cone beam CT angiography was then performed confirming excellent perfusion of the left hemi prostate gland. There was no evidence of non target perfusion. Therefore, particle embolization was then performed to near stasis utilizing 200 micron Terumo Hydrapearls. Just prior to  particle injection, 200 mcg of nitroglycerin was administered. Once near stasis was achieved, the distal prostatic artery was coil embolized using 0.018 Terumo Azure microcoils. A 2 x 2, and 2 x 4 detachable micro coil were placed. The microcatheter was then removed. The C2 cobra catheter was then formed into a Waltman loop and used to select the ipsilateral right internal iliac artery. Arteriography was performed. Anatomy is different on the right-side versus the left. The anterior division consists of a common origin of the umbilical artery and internal pudendal artery. The obturator artery arises proximally from the posterior division and appears to give rise to the prosthetic artery. The Progreat outflow microcatheter was successfully advanced into the obturator artery. Arteriography was performed confirming that the prosthetic artery and superior vesicular artery arise from the mid aspect of the obturator artery. The microcatheter was successfully advanced into the right prostatic artery. Arteriography was performed confirming excellent tumor blush. Cone beam CT angiography was then performed further confirming perfusion of the right hemi prostate gland without evidence of non target perfusion. Nitroglycerin was administered into the distal prosthetic artery. Particle embolization was then performed to near stasis  again using 200 micron spheres. The distal prosthetic artery was then coil embolized using a combination of 2 and 3 mm detachable microcoils. The catheters were removed. A limited right common femoral arteriogram confirms common femoral arterial access. Hemostasis was attained with the assistance of an Angio-Seal extra arterial vascular plug. IMPRESSION: 1. Successful bilateral prostate artery embolization. Signed, Criselda Peaches, MD, [place] [facility] Radiology Electronically Signed   By: Jacqulynn Cadet M.D.   On: 12/24/2019 12:09   IR  Angiogram Selective Each Additional Vessel  Result Date: 12/24/2019 INDICATION: 79 year old male with benign prostatic hyperplasia complicated by recurrent hematuria and lower urinary tract symptoms. He presents for elective prostate artery embolization. EXAM: IR EMBO ARTERIAL NOT HEMORR HEMANG INC GUIDE ROADMAPPING; ADDITIONAL ARTERIOGRAPHY; PELVIC SELECTIVE ARTERIOGRAPHY; ARTERIOGRAPHY 1. Ultrasound-guided access right common femoral artery 2. Catheterization of the left internal iliac artery with arteriogram 3. Catheterization the anterior division of the left internal iliac artery with arteriogram 4. Catheterization of the left prostatic artery with arteriogram 5. Particle embolization of the left prostatic artery 6. Catheterization the right internal iliac artery with arteriogram 7. Catheterization of the anterior division of the right internal iliac artery with arteriogram 8. Catheterization of the right obturator artery with arteriogram 9. Catheterization of the right prostatic artery with arteriogram 10. Particle embolization of the right prostatic artery MEDICATIONS: 400 mg Cipro, 500 mcg nitroglycerin. The antibiotic was administered within 1 hour of the procedure ANESTHESIA/SEDATION: Moderate (conscious) sedation was employed during this procedure. A total of Versed 3 mg and Fentanyl 125 mcg was administered intravenously. Moderate Sedation Time: 107 minutes. The patient's level of consciousness and vital signs were monitored continuously by radiology nursing throughout the procedure under my direct supervision. CONTRAST:  75 mL Isovue-300 FLUOROSCOPY TIME:  Fluoroscopy Time: 25 minutes 18 seconds (3395 mGy). COMPLICATIONS: None immediate. PROCEDURE: Informed consent was obtained from the patient following explanation of the procedure, risks, benefits and alternatives. The patient understands, agrees and consents for the procedure. All questions were addressed. A time out was performed prior to the  initiation of the procedure. Maximal barrier sterile technique utilized including caps, mask, sterile gowns, sterile gloves, large sterile drape, hand hygiene, and Betadine prep. The right common femoral artery was interrogated with ultrasound and found to be widely patent. An image was obtained and stored for the medical record. Local anesthesia was attained by infiltration with 1% lidocaine. A small dermatotomy was made. Under real-time sonographic guidance, the vessel was punctured with a 21 gauge micropuncture needle. Using standard technique, the initial micro needle was exchanged over a 0.018 micro wire for a transitional 4 [place] micro sheath. The micro sheath was then exchanged over a 0.035 wire for a 5 French vascular sheath. A C2 cobra catheter was advanced up in over the aortic bifurcation and used to select the left internal iliac artery. Arteriography was then performed in multiple obliquities. The anterior division of the left internal iliac artery is relatively short and provides a common origin for the obturator, internal pudendal, umbilical, superior vesicular and prosthetic arteries. A Progreat outflow microcatheter was successfully navigated into the proximal trunk of the anterior division. Additional arteriography was performed under greater magnification and in different obliquities in order to isolate the origin of the left prosthetic artery. The microcatheter was then successfully navigated into the prosthetic artery utilizing a Fathom 16 wire. Arteriography was performed demonstrating an excellent parenchymal blush within the prostate gland. Cone beam CT angiography was then performed confirming excellent perfusion  of the left hemi prostate gland. There was no evidence of non target perfusion. Therefore, particle embolization was then performed to near stasis utilizing 200 micron Terumo Hydrapearls. Just prior to particle injection, 200 mcg of nitroglycerin was administered. Once near stasis  was achieved, the distal prostatic artery was coil embolized using 0.018 Terumo Azure microcoils. A 2 x 2, and 2 x 4 detachable micro coil were placed. The microcatheter was then removed. The C2 cobra catheter was then formed into a Waltman loop and used to select the ipsilateral right internal iliac artery. Arteriography was performed. Anatomy is different on the right-side versus the left. The anterior division consists of a common origin of the umbilical artery and internal pudendal artery. The obturator artery arises proximally from the posterior division and appears to give rise to the prosthetic artery. The Progreat outflow microcatheter was successfully advanced into the obturator artery. Arteriography was performed confirming that the prosthetic artery and superior vesicular artery arise from the mid aspect of the obturator artery. The microcatheter was successfully advanced into the right prostatic artery. Arteriography was performed confirming excellent tumor blush. Cone beam CT angiography was then performed further confirming perfusion of the right hemi prostate gland without evidence of non target perfusion. Nitroglycerin was administered into the distal prosthetic artery. Particle embolization was then performed to near stasis again using 200 micron spheres. The distal prosthetic artery was then coil embolized using a combination of 2 and 3 mm detachable microcoils. The catheters were removed. A limited right common femoral arteriogram confirms common femoral arterial access. Hemostasis was attained with the assistance of an Angio-Seal extra arterial vascular plug. IMPRESSION: 1. Successful bilateral prostate artery embolization. Signed, Criselda Peaches, MD, [facility] Radiology Electronically Signed   By: Jacqulynn Cadet M.D.   On: 12/24/2019 12:09   IR Angiogram Selective Each Additional Vessel  Result Date: 12/24/2019 INDICATION:  79 year old male with benign prostatic hyperplasia complicated by recurrent hematuria and lower urinary tract symptoms. He presents for elective prostate artery embolization. EXAM: IR EMBO ARTERIAL NOT HEMORR HEMANG INC GUIDE ROADMAPPING; ADDITIONAL ARTERIOGRAPHY; PELVIC SELECTIVE ARTERIOGRAPHY; ARTERIOGRAPHY 1. Ultrasound-guided access right common femoral artery 2. Catheterization of the left internal iliac artery with arteriogram 3. Catheterization the anterior division of the left internal iliac artery with arteriogram 4. Catheterization of the left prostatic artery with arteriogram 5. Particle embolization of the left prostatic artery 6. Catheterization the right internal iliac artery with arteriogram 7. Catheterization of the anterior division of the right internal iliac artery with arteriogram 8. Catheterization of the right obturator artery with arteriogram 9. Catheterization of the right prostatic artery with arteriogram 10. Particle embolization of the right prostatic artery MEDICATIONS: 400 mg Cipro, 500 mcg nitroglycerin. The antibiotic was administered within 1 hour of the procedure ANESTHESIA/SEDATION: Moderate (conscious) sedation was employed during this procedure. A total of Versed 3 mg and Fentanyl 125 mcg was administered intravenously. Moderate Sedation Time: 107 minutes. The patient's level of consciousness and vital signs were monitored continuously by radiology nursing throughout the procedure under my direct supervision. CONTRAST:  75 mL Isovue-300 FLUOROSCOPY TIME:  Fluoroscopy Time: 25 minutes 18 seconds (3395 mGy). COMPLICATIONS: None immediate. PROCEDURE: Informed consent was obtained from the patient following explanation of the procedure, risks, benefits and alternatives. The patient understands, agrees and consents for the procedure. All questions were addressed. A time out was performed prior to the initiation of the procedure. Maximal barrier sterile technique utilized including  caps, mask, sterile gowns, sterile  gloves, large sterile drape, hand hygiene, and Betadine prep. The right common femoral artery was interrogated with ultrasound and found to be widely patent. An image was obtained and stored for the medical record. Local anesthesia was attained by infiltration with 1% lidocaine. A small dermatotomy was made. Under real-time sonographic guidance, the vessel was punctured with a 21 gauge micropuncture needle. Using standard technique, the initial micro needle was exchanged over a 0.018 micro wire for a transitional 4 [place] micro sheath. The micro sheath was then exchanged over a 0.035 wire for a 5 French vascular sheath. A C2 cobra catheter was advanced up in over the aortic bifurcation and used to select the left internal iliac artery. Arteriography was then performed in multiple obliquities. The anterior division of the left internal iliac artery is relatively short and provides a common origin for the obturator, internal pudendal, umbilical, superior vesicular and prosthetic arteries. A Progreat outflow microcatheter was successfully navigated into the proximal trunk of the anterior division. Additional arteriography was performed under greater magnification and in different obliquities in order to isolate the origin of the left prosthetic artery. The microcatheter was then successfully navigated into the prosthetic artery utilizing a Fathom 16 wire. Arteriography was performed demonstrating an excellent parenchymal blush within the prostate gland. Cone beam CT angiography was then performed confirming excellent perfusion of the left hemi prostate gland. There was no evidence of non target perfusion. Therefore, particle embolization was then performed to near stasis utilizing 200 micron Terumo Hydrapearls. Just prior to particle injection, 200 mcg of nitroglycerin was administered. Once near stasis was achieved, the distal prostatic artery was coil embolized using 0.018 Terumo  Azure microcoils. A 2 x 2, and 2 x 4 detachable micro coil were placed. The microcatheter was then removed. The C2 cobra catheter was then formed into a Waltman loop and used to select the ipsilateral right internal iliac artery. Arteriography was performed. Anatomy is different on the right-side versus the left. The anterior division consists of a common origin of the umbilical artery and internal pudendal artery. The obturator artery arises proximally from the posterior division and appears to give rise to the prosthetic artery. The Progreat outflow microcatheter was successfully advanced into the obturator artery. Arteriography was performed confirming that the prosthetic artery and superior vesicular artery arise from the mid aspect of the obturator artery. The microcatheter was successfully advanced into the right prostatic artery. Arteriography was performed confirming excellent tumor blush. Cone beam CT angiography was then performed further confirming perfusion of the right hemi prostate gland without evidence of non target perfusion. Nitroglycerin was administered into the distal prosthetic artery. Particle embolization was then performed to near stasis again using 200 micron spheres. The distal prosthetic artery was then coil embolized using a combination of 2 and 3 mm detachable microcoils. The catheters were removed. A limited right common femoral arteriogram confirms common femoral arterial access. Hemostasis was attained with the assistance of an Angio-Seal extra arterial vascular plug. IMPRESSION: 1. Successful bilateral prostate artery embolization. Signed, Criselda Peaches, MD, [facility] Radiology Electronically Signed   By: Jacqulynn Cadet M.D.   On: 12/24/2019 12:09   IR US Guide Vasc Access Right  Result Date: 12/27/2019 INDICATION: Add on code.  Please see original dictation. EXAM: IR ULTRASOUND GUIDANCE VASC ACCESS RIGHT  MEDICATIONS: Add on code.  Please see original dictation. ANESTHESIA/SEDATION: Add on code.  Please see original dictation. CONTRAST:  Add on code.  Please see original dictation.  FLUOROSCOPY TIME:  Add on code.  Please see original dictation. COMPLICATIONS: None immediate. PROCEDURE: Add on code.  Please see original dictation. IMPRESSION: Add on code.  Please see original dictation. Electronically Signed   By: Jacqulynn Cadet M.D.   On: 12/27/2019 10:01   IR EMBO ARTERIAL NOT HEMORR HEMANG INC GUIDE ROADMAPPING  Result Date: 12/24/2019 INDICATION: 79 year old male with benign prostatic hyperplasia complicated by recurrent hematuria and lower urinary tract symptoms. He presents for elective prostate artery embolization. EXAM: IR EMBO ARTERIAL NOT HEMORR HEMANG INC GUIDE ROADMAPPING; ADDITIONAL ARTERIOGRAPHY; PELVIC SELECTIVE ARTERIOGRAPHY; ARTERIOGRAPHY 1. Ultrasound-guided access right common femoral artery 2. Catheterization of the left internal iliac artery with arteriogram 3. Catheterization the anterior division of the left internal iliac artery with arteriogram 4. Catheterization of the left prostatic artery with arteriogram 5. Particle embolization of the left prostatic artery 6. Catheterization the right internal iliac artery with arteriogram 7. Catheterization of the anterior division of the right internal iliac artery with arteriogram 8. Catheterization of the right obturator artery with arteriogram 9. Catheterization of the right prostatic artery with arteriogram 10. Particle embolization of the right prostatic artery MEDICATIONS: 400 mg Cipro, 500 mcg nitroglycerin. The antibiotic was administered within 1 hour of the procedure ANESTHESIA/SEDATION: Moderate (conscious) sedation was employed during this procedure. A total of Versed 3 mg and Fentanyl 125 mcg was administered intravenously. Moderate Sedation Time: 107 minutes. The patient's level of consciousness and vital signs were monitored  continuously by radiology nursing throughout the procedure under my direct supervision. CONTRAST:  75 mL Isovue-300 FLUOROSCOPY TIME:  Fluoroscopy Time: 25 minutes 18 seconds (3395 mGy). COMPLICATIONS: None immediate. PROCEDURE: Informed consent was obtained from the patient following explanation of the procedure, risks, benefits and alternatives. The patient understands, agrees and consents for the procedure. All questions were addressed. A time out was performed prior to the initiation of the procedure. Maximal barrier sterile technique utilized including caps, mask, sterile gowns, sterile gloves, large sterile drape, hand hygiene, and Betadine prep. The right common femoral artery was interrogated with ultrasound and found to be widely patent. An image was obtained and stored for the medical record. Local anesthesia was attained by infiltration with 1% lidocaine. A small dermatotomy was made. Under real-time sonographic guidance, the vessel was punctured with a 21 gauge micropuncture needle. Using standard technique, the initial micro needle was exchanged over a 0.018 micro wire for a transitional 4 [place] micro sheath. The micro sheath was then exchanged over a 0.035 wire for a 5 French vascular sheath. A C2 cobra catheter was advanced up in over the aortic bifurcation and used to select the left internal iliac artery. Arteriography was then performed in multiple obliquities. The anterior division of the left internal iliac artery is relatively short and provides a common origin for the obturator, internal pudendal, umbilical, superior vesicular and prosthetic arteries. A Progreat outflow microcatheter was successfully navigated into the proximal trunk of the anterior division. Additional arteriography was performed under greater magnification and in different obliquities in order to isolate the origin of the left prosthetic artery. The microcatheter was then successfully navigated into the prosthetic artery  utilizing a Fathom 16 wire. Arteriography was performed demonstrating an excellent parenchymal blush within the prostate gland. Cone beam CT angiography was then performed confirming excellent perfusion of the left hemi prostate gland. There was no evidence of non target perfusion. Therefore, particle embolization was then performed to near stasis utilizing 200 micron Terumo Hydrapearls. Just prior to particle injection, 200 mcg of  nitroglycerin was administered. Once near stasis was achieved, the distal prostatic artery was coil embolized using 0.018 Terumo Azure microcoils. A 2 x 2, and 2 x 4 detachable micro coil were placed. The microcatheter was then removed. The C2 cobra catheter was then formed into a Waltman loop and used to select the ipsilateral right internal iliac artery. Arteriography was performed. Anatomy is different on the right-side versus the left. The anterior division consists of a common origin of the umbilical artery and internal pudendal artery. The obturator artery arises proximally from the posterior division and appears to give rise to the prosthetic artery. The Progreat outflow microcatheter was successfully advanced into the obturator artery. Arteriography was performed confirming that the prosthetic artery and superior vesicular artery arise from the mid aspect of the obturator artery. The microcatheter was successfully advanced into the right prostatic artery. Arteriography was performed confirming excellent tumor blush. Cone beam CT angiography was then performed further confirming perfusion of the right hemi prostate gland without evidence of non target perfusion. Nitroglycerin was administered into the distal prosthetic artery. Particle embolization was then performed to near stasis again using 200 micron spheres. The distal prosthetic artery was then coil embolized using a combination of 2 and 3 mm detachable microcoils. The catheters were removed. A limited right common femoral  arteriogram confirms common femoral arterial access. Hemostasis was attained with the assistance of an Angio-Seal extra arterial vascular plug. IMPRESSION: 1. Successful bilateral prostate artery embolization. Signed, Criselda Peaches, MD, [facility] Radiology Electronically Signed   By: Jacqulynn Cadet M.D.   On: 12/24/2019 12:09    Labs:  CBC: Recent Labs    04/22/19 0631 04/24/19 0429 04/25/19 0512 12/24/19 0715  WBC 8.2 10.3 6.2 6.6  HGB 10.1* 9.3* 9.0* 13.9  HCT 29.8* 27.2* 26.3* 41.6  PLT 177 162 180 185    COAGS: Recent Labs    12/24/19 0715  INR 1.1    BMP: Recent Labs    04/22/19 0631 04/22/19 0631 04/24/19 0429 04/25/19 0512 12/02/19 0802 12/24/19 0715  NA 136  --  136 137  --  134*  K 3.8  --  3.4* 3.3*  --  3.9  CL 104  --  106 107  --  103  CO2 27  --  25 24  --  23  GLUCOSE 94  --  121* 102*  --  117*  BUN 22  --  11 10  --  16  CALCIUM 7.9*  --  8.4* 8.3*  --  8.6*  CREATININE 1.63*   < > 1.07 1.04 1.40* 1.27*  GFRNONAA 39*  --  >60 >60  --  53*  GFRAA 46*  --  >60 >60  --  >60   < > = values in this interval not displayed.    LIVER FUNCTION TESTS: No results for input(s): BILITOT, AST, ALT, ALKPHOS, PROT, ALBUMIN in the last 8760 hours.  TUMOR MARKERS: No results for input(s): AFPTM, CEA, CA199, CHROMGRNA in the last 8760 hours.  Assessment and Plan:  78 year old male doing extremely well 2 weeks status post bilateral prostatic artery embolization (performed 12/24/2019).  His post procedural course was complicated by a mild worsening of his dementia which has now resolved.  As expected, he did have some transient increase in his lower urinary tract symptoms as well which responded to over-the-counter medications.    1.)  Next clinic visit in 3 months. 2.)  Follow-up with Dr. Bernardo Heater  as scheduled.  If patient's lower urinary tract symptoms demonstrates significant improvement, he may be  able to reduce or discontinue his Avodart.   Electronically Signed: Jacqulynn Cadet 01/11/2020, 11:08 AM   I spent a total of 15 Minutes in remote  clinical consultation, greater than 50% of which was counseling/coordinating care for BPH with LUTS and hematuria.    Visit type: Audio only (telephone). Audio (no video) only due to patient preference. Alternative for in-person consultation at [facility], [address] [place], [place], [place]. This visit type was conducted due to national recommendations for restrictions regarding the COVID-19 Pandemic (e.g. social distancing).  This format is felt to be most appropriate for this patient at this time.  All issues noted in this document were discussed and addressed.

## 2020-01-22 ENCOUNTER — Other Ambulatory Visit: Payer: Self-pay

## 2020-01-22 ENCOUNTER — Ambulatory Visit
Admission: EM | Admit: 2020-01-22 | Discharge: 2020-01-22 | Disposition: A | Discharge status: Home / Self Care | Payer: PPO | Attending: Emergency Medicine | Admitting: Emergency Medicine

## 2020-01-22 DIAGNOSIS — R300 Dysuria: Secondary | ICD-10-CM | POA: Insufficient documentation

## 2020-01-22 DIAGNOSIS — N3090 Cystitis, unspecified without hematuria: Secondary | ICD-10-CM | POA: Insufficient documentation

## 2020-01-22 LAB — URINALYSIS, COMPLETE (UACMP) WITH MICROSCOPIC
Squamous Epithelial / HPF: NONE SEEN (ref 0–5)
WBC, UA: >50 WBC/hpf (ref 0–5)

## 2020-01-22 MED ORDER — DOXYCYCLINE HYCLATE 100 MG PO CAPS
100.0000 mg | ORAL_CAPSULE | Freq: Two times a day (BID) | ORAL | 0 refills | Status: AC
Start: 2020-01-22 — End: 2020-02-05

## 2020-01-22 NOTE — Discharge Instructions (Addendum)
You were seen for dysuria and are being treated for the same.   We are sending your urine out for a culture. If we need to add or change any medications, our nurse will give you a call to let you know.  Increase your water intake and monitor your symptoms.  Take care, Dr. Marland Kitchen, NP-c

## 2020-01-22 NOTE — ED Provider Notes (Signed)
[facility] [place], [place]   Name: Russell Terry DOB: 17-Oct-1940 MRN: [id number] CSN: [id number] PCP: Maryland Pink, MD  Arrival date and time:  01/22/20 0808  Chief Complaint:  Dysuria   NOTE: Prior to seeing the patient today, I have reviewed the triage nursing documentation and vital signs. Clinical staff has updated patient's PMH/PSHx, current medication list, and drug allergies/intolerances to ensure comprehensive history available to assist in medical decision making.   History:   HPI: Russell Terry is a 79 y.o. male who presents today with complaints of burning with urination.  Patient has dementia and his wife is present during the visit.  His wife states the burning was first mentioned to her approximately 3 days ago.  She noticed an increase in his symptoms last night.  She denies any fever or associated symptoms.  She has noticed a change in his behavior such as increased confusion, but no other changes to his diet or activity level. Associated symptoms with his dysuria includes diarrhea, with the most frequent episodes occurring last night.  Imodium was given today to help alleviate the symptoms.  Patient was also had an unexpired prescription for Pyridium and that was given last night to help alleviate his dysuria.  Patient recently underwent bilateral prostate artery embolization on June 11.  He was treated with Cipro prophylactically postprocedure.  Past Medical History:  Diagnosis Date  . Atrial fibrillation ([place]) 12/11/2013  . BPH (benign prostatic hyperplasia)   . Bradycardia 12/11/2013  . Calculus of kidney 12/06/2013  . Cancer ([place])    rt kidney removed  . Chronic kidney disease    stones,rt kidney removed  . COPD (chronic obstructive pulmonary disease) ([employer]) 12/11/2013  . DDD (degenerative disc disease), lumbar 12/13/2013  . Delirium   . Dementia ([place])   . Elevated prostate specific antigen (PSA) 03/03/2013  . Encephalopathy acute 07/22/2016  .  Enlarged prostate with lower urinary tract symptoms (LUTS) 03/03/2013  . Essential hypertension 07/22/2016  . Hyperlipidemia, unspecified 07/09/2017  . Hypertension   . Meningioma ([place]) 10/08/2016  . Parkinsonian features 10/08/2016  . Pleuritic chest pain 05/31/2014  . Renal cell carcinoma ([place]) 05/04/2016  . Renal cyst, acquired, left 04/22/2015    Past Surgical History:  Procedure Laterality Date  . BRAIN SURGERY  2017  . COLON SURGERY    . COLONOSCOPY WITH PROPOFOL N/A 12/31/2016   Procedure: COLONOSCOPY WITH PROPOFOL;  Surgeon: Lollie Sails, MD;  Location: [facility] ENDOSCOPY;  Service: Endoscopy;  Laterality: N/A;  . [place]    . IR ANGIOGRAM PELVIS SELECTIVE OR SUPRASELECTIVE  12/24/2019  . IR ANGIOGRAM PELVIS SELECTIVE OR SUPRASELECTIVE  12/24/2019  . IR ANGIOGRAM SELECTIVE EACH ADDITIONAL VESSEL  12/24/2019  . IR ANGIOGRAM SELECTIVE EACH ADDITIONAL VESSEL  12/24/2019  . IR ANGIOGRAM SELECTIVE EACH ADDITIONAL VESSEL  12/24/2019  . IR EMBO ARTERIAL NOT HEMORR HEMANG INC GUIDE ROADMAPPING  12/24/2019  . IR RADIOLOGIST EVAL & MGMT  11/23/2019  . IR RADIOLOGIST EVAL & MGMT  01/11/2020  . IR US GUIDE VASC ACCESS RIGHT  12/24/2019  . LUMBAR LAMINECTOMY/DECOMPRESSION MICRODISCECTOMY N/A 10/18/2015   Procedure: LUMBAR LAMINECTOMY/DECOMPRESSION MICRODISCECTOMY 3 LEVELS;  Surgeon: Newman Pies, MD;  Location: [facility] NEURO ORS;  Service: Neurosurgery;  Laterality: N/A;  L23 L34 L45 laminectomy and foraminotomy  . NEPHRECTOMY     2012  . URETEROSCOPY WITH HOLMIUM LASER LITHOTRIPSY      Family History  Problem Relation Age of Onset  . Bladder Cancer  Neg Hx   . Kidney cancer Neg Hx   . Prolactinoma Neg Hx   . Prostate cancer Neg Hx     Social History   Tobacco Use  . Smoking status: Current Every Day Smoker    Packs/day: 1.00    Years: 50.00    Pack years: 50.00  . Smokeless tobacco: Never Used  Vaping Use  . Vaping Use: Never used  Substance Use Topics  . Alcohol  use: No  . Drug use: No    Patient Active Problem List   Diagnosis Date Noted  . Hematuria 04/21/2019  . Urinary frequency 02/20/2019  . Urinary urgency 02/20/2019  . History of elevated PSA 02/20/2019  . History of renal cell carcinoma 02/20/2019  . UTI (urinary tract infection) 01/05/2019  . Personal history of kidney cancer 01/08/2018  . Hyperlipidemia, unspecified 07/09/2017  . Anxiety, generalized 10/08/2016  . Meningioma ([place]) 10/08/2016  . Insomnia, persistent 10/08/2016  . Parkinsonian features 10/08/2016  . Agitation 07/22/2016  . Encephalopathy acute 07/22/2016  . Essential hypertension 07/22/2016  . Pseudomonas urinary tract infection 07/22/2016  . Alzheimer's dementia without behavioral disturbance ([place]) 04/18/2016  . Neck pain 11/15/2015  . Lumbar stenosis with neurogenic claudication 10/18/2015  . Renal cyst, acquired, left 04/22/2015  . Pleuritic chest pain 05/31/2014  . History of rectal bleeding 01/11/2014  . History of kidney removal 12/22/2013  . DDD (degenerative disc disease), lumbar 12/13/2013  . Atrial fibrillation ([place]) 12/11/2013  . Bradycardia 12/11/2013  . COPD (chronic obstructive pulmonary disease) ([place]) 12/11/2013  . Nephrolithiasis 12/06/2013  . Other specified disorders of kidney and ureter 11/17/2013  . Benign prostatic hyperplasia with lower urinary tract symptoms 03/03/2013  . Elevated prostate specific antigen (PSA) 03/03/2013  . Incomplete emptying of bladder 03/03/2013  . Nocturia 03/03/2013    Home Medications:    No outpatient medications have been marked as taking for the 01/22/20 encounter [facility] Encounter).    Allergies:   Quinapril, Ambien [zolpidem tartrate], Fenofibrate, Amoxicillin-pot clavulanate, and Morphine  Review of Systems (ROS): Review of Systems  Constitutional: Negative for appetite change, fatigue and fever.  Genitourinary: Positive for dysuria and urgency. Negative for difficulty urinating and flank pain.   Skin: Negative for rash.  Psychiatric/Behavioral: Positive for confusion.  All other systems reviewed and are negative.    Vital Signs: Today's Vitals   01/22/20 0816  BP: (!) 141/102  Pulse: (!) 105  Resp: 16  Temp: 98.1 F (36.7 C)  SpO2: 98%    Physical Exam: Physical Exam Vitals and nursing note reviewed.  Constitutional:      Appearance: Normal appearance.  Cardiovascular:     Rate and Rhythm: Normal rate and regular rhythm.     Pulses: Normal pulses.     Heart sounds: Normal heart sounds.  Pulmonary:     Effort: Pulmonary effort is normal.     Breath sounds: Normal breath sounds.  Abdominal:     Tenderness: There is no abdominal tenderness. There is no right CVA tenderness or left CVA tenderness.  Skin:    General: Skin is warm and dry.  Neurological:     General: No focal deficit present.     Mental Status: He is alert and oriented to person, place, and time.  Psychiatric:        Mood and Affect: Mood normal.        Behavior: Behavior normal.      Urgent Care Treatments / Results:   LABS: PLEASE NOTE: all labs that  were ordered this encounter are listed, however only abnormal results are displayed. Labs Reviewed  URINALYSIS, COMPLETE (UACMP) WITH MICROSCOPIC - Abnormal; Notable for the following components:      Result Value   Color, Urine ORANGE (*)    APPearance CLOUDY (*)    Glucose, UA   (*)    Value: TEST NOT REPORTED DUE TO COLOR INTERFERENCE OF URINE PIGMENT   Hgb urine dipstick   (*)    Value: TEST NOT REPORTED DUE TO COLOR INTERFERENCE OF URINE PIGMENT   Bilirubin Urine   (*)    Value: TEST NOT REPORTED DUE TO COLOR INTERFERENCE OF URINE PIGMENT   Ketones, ur   (*)    Value: TEST NOT REPORTED DUE TO COLOR INTERFERENCE OF URINE PIGMENT   Protein, ur   (*)    Value: TEST NOT REPORTED DUE TO COLOR INTERFERENCE OF URINE PIGMENT   Nitrite   (*)    Value: TEST NOT REPORTED DUE TO COLOR INTERFERENCE OF URINE PIGMENT   Leukocytes,Ua   (*)     Value: TEST NOT REPORTED DUE TO COLOR INTERFERENCE OF URINE PIGMENT   Bacteria, UA MANY (*)    All other components within normal limits  URINE CULTURE    EKG: -None  RADIOLOGY: No results found.  PROCEDURES: Procedures  MEDICATIONS RECEIVED THIS VISIT: Medications - No data to display  PERTINENT CLINICAL COURSE NOTES/UPDATES:   Initial Impression / Assessment and Plan / Urgent Care Course:  Pertinent labs & imaging results that were available during my care of the patient were personally reviewed by me and considered in my medical decision making (see lab/imaging section of note for values and interpretations).  AMADI FRADY is a 79 y.o. male who presents to [facility] today with complaints of dysuria, diagnosed with the same with a probable urinary tract infection, and treated as such with the medications below. NP and patient reviewed discharge instructions below during visit.   Patient is well appearing overall in clinic today. He does not appear to be in any acute distress. Presenting symptoms (see HPI) and exam as documented above.   I have reviewed the follow up and strict return precautions for any new or worsening symptoms. Patient is aware of symptoms that would be deemed urgent/emergent, and would thus require further evaluation either here or in the emergency department. At the time of discharge, he verbalized understanding and consent with the discharge plan as it was reviewed with him. All questions were fielded by provider and/or clinic staff prior to patient discharge.    Final Clinical Impressions / Urgent Care Diagnoses:   Final diagnoses:  Dysuria  Cystitis    New Prescriptions:  [place] Controlled Substance Registry consulted? Not Applicable  Meds ordered this encounter  Medications  . doxycycline (VIBRAMYCIN) 100 MG capsule    Sig: Take 1 capsule (100 mg total) by mouth 2 (two) times daily for 14 days.    Dispense:  28 capsule    Refill:  0       Discharge Instructions     You were seen for dysuria and are being treated for the same.   We are sending your urine out for a culture. If we need to add or change any medications, our nurse will give you a call to let you know.  Increase your water intake and monitor your symptoms.  Take care, Dr. Marland Kitchen, NP-c     Recommended Follow up Care:  Patient encouraged to follow up  with the following provider within the specified time frame, or sooner as dictated by the severity of his symptoms. As always, he was instructed that for any urgent/emergent care needs, he should seek care either here or in the emergency department for more immediate evaluation.   Gertie Baron, DNP, NP-c    Gertie Baron, NP 01/22/20 1018

## 2020-01-22 NOTE — ED Triage Notes (Addendum)
Pt with dementia, per wife patient c/o burning with urination since yesterday afternoon. Pt had procedure on June 11 for prostate (PAE). Pt took pyridium last night without much relief  Pt also c/o diarrhea since last night. Took Immodium this AM, per wife patient has not had diarrhea today.

## 2020-01-24 ENCOUNTER — Telehealth (HOSPITAL_COMMUNITY): Payer: Self-pay

## 2020-01-24 LAB — URINE CULTURE: Culture: >=100000 — AB

## 2020-01-24 MED ORDER — CEFDINIR 300 MG PO CAPS
300.0000 mg | ORAL_CAPSULE | Freq: Two times a day (BID) | ORAL | 0 refills | Status: AC
Start: 2020-01-24 — End: 2020-01-31

## 2020-01-26 DIAGNOSIS — D225 Melanocytic nevi of trunk: Secondary | ICD-10-CM | POA: Diagnosis not present

## 2020-01-26 DIAGNOSIS — D2262 Melanocytic nevi of left upper limb, including shoulder: Secondary | ICD-10-CM | POA: Diagnosis not present

## 2020-01-26 DIAGNOSIS — D2271 Melanocytic nevi of right lower limb, including hip: Secondary | ICD-10-CM | POA: Diagnosis not present

## 2020-01-26 DIAGNOSIS — D2272 Melanocytic nevi of left lower limb, including hip: Secondary | ICD-10-CM | POA: Diagnosis not present

## 2020-01-26 DIAGNOSIS — F0390 Unspecified dementia without behavioral disturbance: Secondary | ICD-10-CM | POA: Diagnosis not present

## 2020-01-26 DIAGNOSIS — L821 Other seborrheic keratosis: Secondary | ICD-10-CM | POA: Diagnosis not present

## 2020-01-26 DIAGNOSIS — D2261 Melanocytic nevi of right upper limb, including shoulder: Secondary | ICD-10-CM | POA: Diagnosis not present

## 2020-01-27 DIAGNOSIS — J439 Emphysema, unspecified: Secondary | ICD-10-CM | POA: Diagnosis not present

## 2020-01-27 DIAGNOSIS — G301 Alzheimer's disease with late onset: Secondary | ICD-10-CM | POA: Diagnosis not present

## 2020-01-27 DIAGNOSIS — F0280 Dementia in other diseases classified elsewhere without behavioral disturbance: Secondary | ICD-10-CM | POA: Diagnosis not present

## 2020-01-27 DIAGNOSIS — E785 Hyperlipidemia, unspecified: Secondary | ICD-10-CM | POA: Diagnosis not present

## 2020-01-27 DIAGNOSIS — R001 Bradycardia, unspecified: Secondary | ICD-10-CM | POA: Diagnosis not present

## 2020-01-27 DIAGNOSIS — I10 Essential (primary) hypertension: Secondary | ICD-10-CM | POA: Diagnosis not present

## 2020-01-27 DIAGNOSIS — I480 Paroxysmal atrial fibrillation: Secondary | ICD-10-CM | POA: Diagnosis not present

## 2020-02-08 ENCOUNTER — Other Ambulatory Visit (HOSPITAL_COMMUNITY): Payer: Self-pay | Admitting: Interventional Radiology

## 2020-02-08 ENCOUNTER — Encounter (HOSPITAL_COMMUNITY): Payer: Self-pay

## 2020-02-08 DIAGNOSIS — N400 Enlarged prostate without lower urinary tract symptoms: Secondary | ICD-10-CM

## 2020-02-08 NOTE — Addendum Note (Signed)
Encounter addended by: Riley Churches on: 02/08/2020 9:48 AM  Actions taken: Order list changed, Imaging Exam ended, Charge Capture section accepted

## 2020-02-08 NOTE — Addendum Note (Signed)
Encounter addended by: Riley Churches on: 02/08/2020 12:15 PM  Actions taken: Imaging Exam ended

## 2020-02-08 NOTE — Addendum Note (Signed)
Encounter addended by: Riley Churches on: 02/08/2020 12:14 PM  Actions taken: Imaging Exam ended, Charge Capture section accepted

## 2020-02-16 DIAGNOSIS — N390 Urinary tract infection, site not specified: Secondary | ICD-10-CM | POA: Diagnosis not present

## 2020-02-16 DIAGNOSIS — R5383 Other fatigue: Secondary | ICD-10-CM | POA: Diagnosis not present

## 2020-02-16 DIAGNOSIS — I10 Essential (primary) hypertension: Secondary | ICD-10-CM | POA: Diagnosis not present

## 2020-02-16 DIAGNOSIS — R5381 Other malaise: Secondary | ICD-10-CM | POA: Diagnosis not present

## 2020-02-26 DIAGNOSIS — F0390 Unspecified dementia without behavioral disturbance: Secondary | ICD-10-CM | POA: Diagnosis not present

## 2020-03-01 ENCOUNTER — Other Ambulatory Visit: Payer: Self-pay | Admitting: Interventional Radiology

## 2020-03-01 DIAGNOSIS — N401 Enlarged prostate with lower urinary tract symptoms: Secondary | ICD-10-CM

## 2020-03-16 DIAGNOSIS — G4700 Insomnia, unspecified: Secondary | ICD-10-CM | POA: Diagnosis not present

## 2020-03-16 DIAGNOSIS — G20 Parkinson's disease: Secondary | ICD-10-CM | POA: Diagnosis not present

## 2020-03-28 DIAGNOSIS — F0390 Unspecified dementia without behavioral disturbance: Secondary | ICD-10-CM | POA: Diagnosis not present

## 2020-04-03 ENCOUNTER — Ambulatory Visit: Payer: Self-pay | Admitting: Urology

## 2020-04-04 ENCOUNTER — Ambulatory Visit
Admission: RE | Admit: 2020-04-04 | Discharge: 2020-04-04 | Disposition: A | Discharge status: Home / Self Care | Payer: PPO | Source: Ambulatory Visit | Attending: Interventional Radiology | Admitting: Interventional Radiology

## 2020-04-04 ENCOUNTER — Other Ambulatory Visit: Payer: Self-pay

## 2020-04-04 ENCOUNTER — Encounter: Payer: Self-pay | Admitting: *Deleted

## 2020-04-04 DIAGNOSIS — N401 Enlarged prostate with lower urinary tract symptoms: Secondary | ICD-10-CM

## 2020-04-04 DIAGNOSIS — N400 Enlarged prostate without lower urinary tract symptoms: Secondary | ICD-10-CM | POA: Diagnosis not present

## 2020-04-04 HISTORY — PX: IR RADIOLOGIST EVAL & MGMT: IMG5224

## 2020-04-04 NOTE — Progress Notes (Signed)
Chief Complaint: Patient was consulted remotely today (TeleHealth) for hematuria, BPH at the request of [place].    Referring Physician(s): Dr. John Giovanni  History of Present Illness: Russell Terry is a 79 y.o. male with a history of benign prostatic hyperplasia complicated by lower urinary tract symptoms as well as spontaneous hematuria resulting in clot retention and necessitating hospitalization with catheterization and continuous bladder irrigation.  He underwent bilateral prostatic artery embolization on 12/24/2019.  I spoke with he and his wife today via telemedicine 3 months post procedure.  The majority of his history is provided by his wife due to his underlying dementia.  Mrs. Howatt reports that Mr. Macomber is doing fairly well.  Unfortunately, he continues to have progression of his underlying Parkinson's disease and Lewy body dementia.  He has experienced no episodes of hematuria.    He has become dependent on depends adult sanitary diapers.  This makes it difficult to assess for any improvement in his lower urinary tract symptoms.  Overall, she is pleased with the fact that she has noted no further hematuria.  Past Medical History:  Diagnosis Date  . Atrial fibrillation ([place]) 12/11/2013  . BPH (benign prostatic hyperplasia)   . Bradycardia 12/11/2013  . Calculus of kidney 12/06/2013  . Cancer ([place])    rt kidney removed  . Chronic kidney disease    stones,rt kidney removed  . COPD (chronic obstructive pulmonary disease) ([place]) 12/11/2013  . DDD (degenerative disc disease), lumbar 12/13/2013  . Delirium   . Dementia ([place])   . Elevated prostate specific antigen (PSA) 03/03/2013  . Encephalopathy acute 07/22/2016  . Enlarged prostate with lower urinary tract symptoms (LUTS) 03/03/2013  . Essential hypertension 07/22/2016  . Hyperlipidemia, unspecified 07/09/2017  . Hypertension   . Meningioma ([place]) 10/08/2016  . Parkinsonian features 10/08/2016  .  Pleuritic chest pain 05/31/2014  . Renal cell carcinoma ([place]) 05/04/2016  . Renal cyst, acquired, left 04/22/2015    Past Surgical History:  Procedure Laterality Date  . BRAIN SURGERY  2017  . COLON SURGERY    . COLONOSCOPY WITH PROPOFOL N/A 12/31/2016   Procedure: COLONOSCOPY WITH PROPOFOL;  Surgeon: Lollie Sails, MD;  Location: [facility] ENDOSCOPY;  Service: Endoscopy;  Laterality: N/A;  . [place]    . IR EMBO TUMOR ORGAN ISCHEMIA INFARCT INC GUIDE ROADMAPPING  12/24/2019  . IR RADIOLOGIST EVAL & MGMT  11/23/2019  . IR RADIOLOGIST EVAL & MGMT  01/11/2020  . IR US GUIDE VASC ACCESS RIGHT  12/24/2019  . LUMBAR LAMINECTOMY/DECOMPRESSION MICRODISCECTOMY N/A 10/18/2015   Procedure: LUMBAR LAMINECTOMY/DECOMPRESSION MICRODISCECTOMY 3 LEVELS;  Surgeon: Newman Pies, MD;  Location: [employer] NEURO ORS;  Service: Neurosurgery;  Laterality: N/A;  L23 L34 L45 laminectomy and foraminotomy  . NEPHRECTOMY     2012  . URETEROSCOPY WITH HOLMIUM LASER LITHOTRIPSY      Allergies: Quinapril, Ambien [zolpidem tartrate], Fenofibrate, Amoxicillin-pot clavulanate, and Morphine  Medications: Prior to Admission medications   Medication Sig Start Date End Date Taking? Authorizing Provider  acetaminophen (TYLENOL) 325 MG tablet Take 2 tablets (650 mg total) by mouth every 6 (six) hours as needed for mild pain (or Fever >/= 101). 04/25/19   Stark Jock Jude, MD  albuterol (VENTOLIN HFA) 108 (90 Base) MCG/ACT inhaler Inhale 2 puffs into the lungs every 6 (six) hours as needed for wheezing or shortness of breath.  01/11/19   [provider]  aspirin EC 81 MG tablet Take 81 mg by mouth daily.  [provider]  carbidopa-levodopa (SINEMET CR) 50-200 MG tablet Take 1 tablet by mouth 2 (two) times daily.    [provider]  carbidopa-levodopa (SINEMET IR) 25-100 MG tablet Take 2 tablets by mouth 3 (three) times daily.  05/03/17   [provider]  cetirizine (ZYRTEC) 10 MG  tablet Take 10 mg by mouth daily.    [provider]  diltiazem (CARDIZEM CD) 240 MG 24 hr capsule Take 240 mg by mouth daily.     [provider]  donepezil (ARICEPT) 10 MG tablet Take 10 mg by mouth every evening.     [provider]  doxazosin (CARDURA) 4 MG tablet Take 4 mg by mouth daily.    [provider]  dutasteride (AVODART) 0.5 MG capsule Take 0.5 mg by mouth daily.    [provider]  gabapentin (NEURONTIN) 100 MG capsule Take 100 mg by mouth at bedtime.  07/30/19   [provider]  Melatonin 5 MG TABS Take 5 mg by mouth at bedtime.    [provider]  polyethylene glycol (MIRALAX / GLYCOLAX) 17 g packet Take 17 g by mouth daily as needed for moderate constipation.    [provider]  tamsulosin (FLOMAX) 0.4 MG CAPS capsule Take 0.4 mg by mouth 2 (two) times daily.    [provider]  traZODone (DESYREL) 100 MG tablet Take 200 mg by mouth at bedtime.  07/04/18   [provider]  venlafaxine XR (EFFEXOR-XR) 75 MG 24 hr capsule Take 75 mg by mouth at bedtime.  04/14/18   [provider]     Family History  Problem Relation Age of Onset  . Bladder Cancer Neg Hx   . Kidney cancer Neg Hx   . Prolactinoma Neg Hx   . Prostate cancer Neg Hx     Social History   Socioeconomic History  . Marital status: Married    Spouse name: Not on file  . Number of children: Not on file  . Years of education: Not on file  . Highest education level: Not on file  Occupational History  . Not on file  Tobacco Use  . Smoking status: Current Every Day Smoker    Packs/day: 1.00    Years: 50.00    Pack years: 50.00  . Smokeless tobacco: Never Used  Vaping Use  . Vaping Use: Never used  Substance and Sexual Activity  . Alcohol use: No  . Drug use: No  . Sexual activity: Yes    Birth control/protection: None  Other Topics Concern  . Not on file  Social History Narrative  . Not on file   Social  Determinants of Health   Financial Resource Strain: Low Risk   . Difficulty of Paying Living Expenses: Not hard at all  Food Insecurity: No Food Insecurity  . Worried About Charity fundraiser in the Last Year: Never true  . Ran Out of Food in the Last Year: Never true  Transportation Needs: No Transportation Needs  . Lack of Transportation (Medical): No  . Lack of Transportation (Non-Medical): No  Physical Activity: Inactive  . Days of Exercise per Week: 0 days  . Minutes of Exercise per Session: 0 min  Stress: No Stress Concern Present  . Feeling of Stress : Not at all  Social Connections: Moderately Integrated  . Frequency of Communication with Friends and Family: More than three times a week  . Frequency of Social Gatherings with Friends and Family: More than  three times a week  . Attends Religious Services: More than 4 times per year  . Active Member of Clubs or Organizations: No  . Attends Archivist Meetings: Never  . Marital Status: Married    Review of Systems  Review of Systems: A 12 point ROS discussed and pertinent positives are indicated in the HPI above.  All other systems are negative.  Physical Exam No direct physical exam was performed (except for noted visual exam findings with Video Visits).    Vital Signs: There were no vitals taken for this visit.  Imaging: No results found.  Labs:  CBC: Recent Labs    04/22/19 0631 04/24/19 0429 04/25/19 0512 12/24/19 0715  WBC 8.2 10.3 6.2 6.6  HGB 10.1* 9.3* 9.0* 13.9  HCT 29.8* 27.2* 26.3* 41.6  PLT 177 162 180 185    COAGS: Recent Labs    12/24/19 0715  INR 1.1    BMP: Recent Labs    04/22/19 0631 04/22/19 0631 04/24/19 0429 04/25/19 0512 12/02/19 0802 12/24/19 0715  NA 136  --  136 137  --  134*  K 3.8  --  3.4* 3.3*  --  3.9  CL 104  --  106 107  --  103  CO2 27  --  25 24  --  23  GLUCOSE 94  --  121* 102*  --  117*  BUN 22  --  11 10  --  16  CALCIUM 7.9*  --  8.4* 8.3*   --  8.6*  CREATININE 1.63*   < > 1.07 1.04 1.40* 1.27*  GFRNONAA 39*  --  >60 >60  --  53*  GFRAA 46*  --  >60 >60  --  >60   < > = values in this interval not displayed.    LIVER FUNCTION TESTS: No results for input(s): BILITOT, AST, ALT, ALKPHOS, PROT, ALBUMIN in the last 8760 hours.  TUMOR MARKERS: No results for input(s): AFPTM, CEA, CA199, CHROMGRNA in the last 8760 hours.  Assessment and Plan:  Mr. Erny is doing fairly well in regard to his benign prostatic hyperplasia.  Unfortunately, his Parkinson's disease and Lewy body dementia continues to progress.  Importantly, he has had no further episodes of hematuria.  His wife finds it difficult to assess his lower urinary tract symptoms as he is now dependent on adult sanitary diapers.  1.)  At this point, we have no further scheduled follow up.  He should be protected from future episodes of significant hematuria.  If she should see any blood in his urine in the future, she should call our office to schedule a follow-up appointment.   Electronically Signed: Jacqulynn Cadet 04/04/2020, 9:34 AM   I spent a total of 10 Minutes in remote  clinical consultation, greater than 50% of which was counseling/coordinating care for BPH.    Visit type: Audio only (telephone). Audio (no video) only due to patient preference. Alternative for in-person consultation at [facility], [address], [place], [place]. This visit type was conducted due to national recommendations for restrictions regarding the COVID-19 Pandemic (e.g. social distancing).  This format is felt to be most appropriate for this patient at this time.  All issues noted in this document were discussed and addressed.

## 2020-04-05 ENCOUNTER — Other Ambulatory Visit: Payer: Self-pay

## 2020-04-05 ENCOUNTER — Encounter: Payer: Self-pay | Admitting: Urology

## 2020-04-05 ENCOUNTER — Ambulatory Visit: Payer: PPO | Admitting: Physician Assistant

## 2020-04-05 VITALS — BP 76/52 | HR 89 | Ht 69.5 in | Wt 175.0 lb

## 2020-04-05 DIAGNOSIS — R310 Gross hematuria: Secondary | ICD-10-CM

## 2020-04-05 DIAGNOSIS — N138 Other obstructive and reflux uropathy: Secondary | ICD-10-CM | POA: Diagnosis not present

## 2020-04-05 DIAGNOSIS — N401 Enlarged prostate with lower urinary tract symptoms: Secondary | ICD-10-CM

## 2020-04-05 LAB — BLADDER SCAN AMB NON-IMAGING: Scan Result: 3

## 2020-04-05 MED ORDER — FOSFOMYCIN TROMETHAMINE 3 G PO PACK: 3.0000 g | PACK | Freq: Once | ORAL | 0 refills | Status: AC

## 2020-04-05 MED ORDER — TAMSULOSIN HCL 0.4 MG PO CAPS: 0.4000 mg | ORAL_CAPSULE | Freq: Every day | ORAL | 5 refills | Status: DC

## 2020-04-05 NOTE — Patient Instructions (Signed)
STOP doxazosin. REDUCE tamsulosin (Flomax) to 1 pill daily (0.4 mg). CONTINUE dutasteride.

## 2020-04-05 NOTE — Progress Notes (Signed)
04/05/2020 3:44 PM   Russell Terry Nov 13, 1940 [id number]  CC: Chief Complaint  Patient presents with  . Follow-up    HPI: Russell Terry is a 79 y.o. male with PMH BPH with LUTS on dutasteride, doxazosin, and tamsulosin with gross hematuria, elevated PSA with benign biopsy, recurrent nephrolithiasis, and T1b renal cell carcinoma s/p right laparoscopic nephrectomy in 2015 who presents today for routine follow-up.  He was seen by Dr. Bernardo Terry in clinic most recently on 09/23/2019 for cystoscopy in the setting of gross hematuria.  Cystoscopy notable for marked lateral lobe enlargement with hypervascularity.  He is accompanied today by his wife, who contributes to HPI.  Patient underwent prostatic artery embolization with Dr. Laurence Terry on 12/24/2019.  He tolerated the procedure well and notes continuous urinary leakage since.  Additionally, patient has had significant worsening in his Parkinson's disease and Lewy body dementia over the past year.  Patient's wife reports he continues to take dutasteride 0.5 mg daily, doxazosin 4 mg daily, and tamsulosin 0.8 mg daily.  He has had stable hypotension recently and wife is concerned about polypharmacy contributing to this.  Additionally, patient's wife reports some gross hematuria yesterday and today.  Patient has reported pain in his lower back associated with this.  In-office UA today positive for trace glucose, trace ketones, 2+ protein, and 1+ leukocyte esterase; urine microscopy with >30 WBCs/HPF, 3-10 RBCs/HPF, and many bacteria. PVR 51mL.  PMH: Past Medical History:  Diagnosis Date  . Atrial fibrillation ([place]) 12/11/2013  . BPH (benign prostatic hyperplasia)   . Bradycardia 12/11/2013  . Calculus of kidney 12/06/2013  . Cancer ([place])    rt kidney removed  . Chronic kidney disease    stones,rt kidney removed  . COPD (chronic obstructive pulmonary disease) ([place]) 12/11/2013  . DDD (degenerative disc disease), lumbar 12/13/2013  .  Delirium   . Dementia ([place])   . Elevated prostate specific antigen (PSA) 03/03/2013  . Encephalopathy acute 07/22/2016  . Enlarged prostate with lower urinary tract symptoms (LUTS) 03/03/2013  . Essential hypertension 07/22/2016  . Hyperlipidemia, unspecified 07/09/2017  . Hypertension   . Meningioma ([place]) 10/08/2016  . Parkinsonian features 10/08/2016  . Pleuritic chest pain 05/31/2014  . Renal cell carcinoma ([place]) 05/04/2016  . Renal cyst, acquired, left 04/22/2015    Surgical History: Past Surgical History:  Procedure Laterality Date  . BRAIN SURGERY  2017  . COLON SURGERY    . COLONOSCOPY WITH PROPOFOL N/A 12/31/2016   Procedure: COLONOSCOPY WITH PROPOFOL;  Surgeon: Lollie Sails, MD;  Location: [facility] ENDOSCOPY;  Service: Endoscopy;  Laterality: N/A;  . [place]    . IR EMBO TUMOR ORGAN ISCHEMIA INFARCT INC GUIDE ROADMAPPING  12/24/2019  . IR RADIOLOGIST EVAL & MGMT  11/23/2019  . IR RADIOLOGIST EVAL & MGMT  01/11/2020  . IR RADIOLOGIST EVAL & MGMT  04/04/2020  . IR US GUIDE VASC ACCESS RIGHT  12/24/2019  . LUMBAR LAMINECTOMY/DECOMPRESSION MICRODISCECTOMY N/A 10/18/2015   Procedure: LUMBAR LAMINECTOMY/DECOMPRESSION MICRODISCECTOMY 3 LEVELS;  Surgeon: Newman Pies, MD;  Location: [facility] NEURO ORS;  Service: Neurosurgery;  Laterality: N/A;  L23 L34 L45 laminectomy and foraminotomy  . NEPHRECTOMY     2012  . URETEROSCOPY WITH HOLMIUM LASER LITHOTRIPSY      Home Medications:  Allergies as of 04/05/2020      Reactions   Quinapril Cough   Ambien [zolpidem Tartrate] Other (See Comments)   Unspecified reaction   Fenofibrate Other (See Comments)   Unknown   Amoxicillin-pot  Clavulanate Nausea And Vomiting   Morphine Nausea Only      Medication List       Accurate as of April 05, 2020  3:44 PM. If you have any questions, ask your nurse or doctor.        STOP taking these medications   doxazosin 4 MG tablet Commonly known as: CARDURA Stopped by: Debroah Loop, PA-C     TAKE these medications   acetaminophen 325 MG tablet Commonly known as: TYLENOL Take 2 tablets (650 mg total) by mouth every 6 (six) hours as needed for mild pain (or Fever >/= 101).   albuterol 108 (90 Base) MCG/ACT inhaler Commonly known as: VENTOLIN HFA Inhale 2 puffs into the lungs every 6 (six) hours as needed for wheezing or shortness of breath.   aspirin EC 81 MG tablet Take 81 mg by mouth daily.   carbidopa-levodopa 50-200 MG tablet Commonly known as: SINEMET CR Take 1 tablet by mouth 2 (two) times daily.   carbidopa-levodopa 25-100 MG tablet Commonly known as: SINEMET IR Take 2 tablets by mouth 3 (three) times daily.   cetirizine 10 MG tablet Commonly known as: ZYRTEC Take 10 mg by mouth daily.   clonazePAM 0.5 MG tablet Commonly known as: KLONOPIN Take by mouth.   diltiazem 240 MG 24 hr capsule Commonly known as: CARDIZEM CD Take 240 mg by mouth daily.   donepezil 10 MG tablet Commonly known as: ARICEPT Take 10 mg by mouth every evening.   dutasteride 0.5 MG capsule Commonly known as: AVODART Take 0.5 mg by mouth daily.   fosfomycin 3 g Pack Commonly known as: MONUROL Take 3 g by mouth once for 1 dose. Started by: Debroah Loop, PA-C   gabapentin 100 MG capsule Commonly known as: NEURONTIN Take 100 mg by mouth at bedtime.   melatonin 5 MG Tabs Take 5 mg by mouth at bedtime.   polyethylene glycol 17 g packet Commonly known as: MIRALAX / GLYCOLAX Take 17 g by mouth daily as needed for moderate constipation.   tamsulosin 0.4 MG Caps capsule Commonly known as: FLOMAX Take 1 capsule (0.4 mg total) by mouth daily. What changed: when to take this Changed by: Debroah Loop, PA-C   traZODone 100 MG tablet Commonly known as: DESYREL Take 200 mg by mouth at bedtime.   venlafaxine XR 75 MG 24 hr capsule Commonly known as: EFFEXOR-XR Take 75 mg by mouth at bedtime.       Allergies:  Allergies  Allergen  Reactions  . Quinapril Cough  . Ambien [Zolpidem Tartrate] Other (See Comments)    Unspecified reaction  . Fenofibrate Other (See Comments)    Unknown  . Amoxicillin-Pot Clavulanate Nausea And Vomiting  . Morphine Nausea Only    Family History: Family History  Problem Relation Age of Onset  . Bladder Cancer Neg Hx   . Kidney cancer Neg Hx   . Prolactinoma Neg Hx   . Prostate cancer Neg Hx     Social History:   reports that he has been smoking. He has a 50.00 pack-year smoking history. He has never used smokeless tobacco. He reports that he does not drink alcohol and does not use drugs.  Physical Exam: BP (!) 76/52   Pulse 89   Ht 5' 9.5" (1.765 m)   Wt 175 lb (79.4 kg)   BMI 25.47 kg/m   Constitutional:  Alert, no acute distress, nontoxic appearing HEENT: [place], AT Cardiovascular: No clubbing, cyanosis, or edema Respiratory: Normal respiratory effort,  no increased work of breathing Skin: No rashes, bruises or suspicious lesions Neurologic: Grossly intact, no focal deficits, moving all 4 extremities Psychiatric: Visual hallucinations  Laboratory Data: Results for orders placed or performed in visit on 04/05/20  Microscopic Examination   Urine  Result Value Ref Range   WBC, UA >30 (A) 0 - 5 /hpf   RBC 3-10 (A) 0 - 2 /hpf   Epithelial Cells (non renal) 0-10 0 - 10 /hpf   Casts Present (A) None seen /lpf   Cast Type Hyaline casts N/A   Bacteria, UA Many (A) None seen/Few  Urinalysis, Complete  Result Value Ref Range   Specific Gravity, UA 1.025 1.005 - 1.030   pH, UA 6.0 5.0 - 7.5   Color, UA Yellow Yellow   Appearance Ur Cloudy (A) Clear   Leukocytes,UA 1+ (A) Negative   Protein,UA 2+ (A) Negative/Trace   Glucose, UA Trace (A) Negative   Ketones, UA Trace (A) Negative   RBC, UA Negative Negative   Bilirubin, UA Negative Negative   Urobilinogen, Ur 1.0 0.2 - 1.0 mg/dL   Nitrite, UA Negative Negative   Microscopic Examination See below:   Bladder Scan (Post Void  Residual) in office  Result Value Ref Range   Scan Result 3    Assessment & Plan:   1. Benign prostatic hyperplasia with urinary obstruction S/p prostatic artery embolization.  PVR WNL today with persistent hypotension on dutasteride, doxazosin, and double dose of tamsulosin.  I counseled the patient's wife that I would like him to stop doxazosin and reduce his daily tamsulosin to 0.4 mg daily with plans for symptom recheck and PVR in 1 month.  She is in agreement with this plan.  We will plan to continue dutasteride to reduce the risk of prostatic bleeding. - Bladder Scan (Post Void Residual) in office - tamsulosin (FLOMAX) 0.4 MG CAPS capsule; Take 1 capsule (0.4 mg total) by mouth daily.  Dispense: 30 capsule; Refill: 5  2. Gross hematuria UA grossly infected today consistent with prior. Unclear if this represents true infection versus urinary colonization, however history is difficult to obtain due to patient's advanced dementia.  Will treat empirically with fosfomycin per recent culture results and send for culture for further evaluation.  - Urinalysis, Complete - CULTURE, URINE COMPREHENSIVE - fosfomycin (MONUROL) 3 g PACK; Take 3 g by mouth once for 1 dose.  Dispense: 3 g; Refill: 0   Return in about 4 weeks (around 05/03/2020) for Symptom recheck with PVR.  Debroah Loop, PA-C  [facility] [address] [place], [place] [address] [phone]

## 2020-04-06 LAB — MICROSCOPIC EXAMINATION: WBC, UA: >30 /hpf — AB (ref 0–5)

## 2020-04-06 LAB — URINALYSIS, COMPLETE
Bilirubin, UA: NEGATIVE
Nitrite, UA: NEGATIVE
RBC, UA: NEGATIVE
Specific Gravity, UA: 1.025 (ref 1.005–1.030)
Urobilinogen, Ur: 1 mg/dL (ref 0.2–1.0)
pH, UA: 6 (ref 5.0–7.5)

## 2020-04-11 LAB — CULTURE, URINE COMPREHENSIVE

## 2020-04-19 ENCOUNTER — Other Ambulatory Visit: Payer: Self-pay

## 2020-04-19 ENCOUNTER — Encounter: Payer: Self-pay | Admitting: Physician Assistant

## 2020-04-19 ENCOUNTER — Ambulatory Visit: Payer: PPO | Admitting: Physician Assistant

## 2020-04-19 VITALS — BP 163/107 | HR 75 | Ht 69.5 in | Wt 175.0 lb

## 2020-04-19 DIAGNOSIS — R410 Disorientation, unspecified: Secondary | ICD-10-CM | POA: Diagnosis not present

## 2020-04-19 DIAGNOSIS — N138 Other obstructive and reflux uropathy: Secondary | ICD-10-CM

## 2020-04-19 DIAGNOSIS — R300 Dysuria: Secondary | ICD-10-CM | POA: Diagnosis not present

## 2020-04-19 DIAGNOSIS — N401 Enlarged prostate with lower urinary tract symptoms: Secondary | ICD-10-CM

## 2020-04-19 DIAGNOSIS — R350 Frequency of micturition: Secondary | ICD-10-CM | POA: Diagnosis not present

## 2020-04-19 LAB — MICROSCOPIC EXAMINATION: Bacteria, UA: NONE SEEN

## 2020-04-19 LAB — URINALYSIS, COMPLETE
Bilirubin, UA: NEGATIVE
Glucose, UA: NEGATIVE
Ketones, UA: NEGATIVE
Nitrite, UA: NEGATIVE
RBC, UA: NEGATIVE
Specific Gravity, UA: 1.02 (ref 1.005–1.030)
Urobilinogen, Ur: 0.2 mg/dL (ref 0.2–1.0)
pH, UA: 7 (ref 5.0–7.5)

## 2020-04-19 LAB — BLADDER SCAN AMB NON-IMAGING: Scan Result: 53

## 2020-04-19 NOTE — Progress Notes (Signed)
04/19/2020 9:28 AM   Russell Terry [birth year] [id number]  CC: Chief Complaint  Patient presents with  . Follow-up    HPI: Russell Terry is a 79 y.o. male with PMH BPH with LUTS s/p prostatic artery embolization in 2021 on tamsulosin 0.4 mg daily and dutasteride 0.5 mg daily, elevated PSA with benign biopsy, recurrent nephrolithiasis, T1b RCC s/p right laparoscopic nephrectomy in 2015, Parkinson's disease, and Lewy body dementia who presents today for evaluation of possible UTI.    I saw him in clinic most recently on 04/05/2020 for routine follow-up of the above.  At that time, he reported gross hematuria and low back pain with grossly positive UA.  I treated him with empiric fosfomycin; urine culture ultimately grew tetracycline resistant E faecalis and multidrug-resistant Pseudomonas.  Additionally, I stopped doxazosin and reduced tamsulosin from 0.8 mg daily.  He is accompanied today by his wife, who contributes to HPI.  Today, she reports patient's mental status has taken a "nosedive" over the past couple of weeks. She is trying to rule out treatable causes of his worsened confusion. He is scheduled for follow-up with neurology tomorrow.  Patient denies dysuria, abdominal pain, and low back pain today.  In-office UA today positive for trace protein and 2+ leukocyte esterase; urine microscopy with 6-10 WBCs/HPF. PVR 18mL.  PMH: Past Medical History:  Diagnosis Date  . Atrial fibrillation ([place]) 12/11/2013  . BPH (benign prostatic hyperplasia)   . Bradycardia 12/11/2013  . Calculus of kidney 12/06/2013  . Cancer ([place])    rt kidney removed  . Chronic kidney disease    stones,rt kidney removed  . COPD (chronic obstructive pulmonary disease) ([place]) 12/11/2013  . DDD (degenerative disc disease), lumbar 12/13/2013  . Delirium   . Dementia ([place])   . Elevated prostate specific antigen (PSA) 03/03/2013  . Encephalopathy acute 07/22/2016  . Enlarged prostate with lower urinary tract  symptoms (LUTS) 03/03/2013  . Essential hypertension 07/22/2016  . Hyperlipidemia, unspecified 07/09/2017  . Hypertension   . Meningioma ([place]) 10/08/2016  . Parkinsonian features 10/08/2016  . Pleuritic chest pain 05/31/2014  . Renal cell carcinoma ([place]) 05/04/2016  . Renal cyst, acquired, left 04/22/2015    Surgical History: Past Surgical History:  Procedure Laterality Date  . BRAIN SURGERY  2017  . COLON SURGERY    . COLONOSCOPY WITH PROPOFOL N/A 12/31/2016   Procedure: COLONOSCOPY WITH PROPOFOL;  Surgeon: Lollie Sails, MD;  Location: [facility] ENDOSCOPY;  Service: Endoscopy;  Laterality: N/A;  . [place]    . IR EMBO TUMOR ORGAN ISCHEMIA INFARCT INC GUIDE ROADMAPPING  12/24/2019  . IR RADIOLOGIST EVAL & MGMT  11/23/2019  . IR RADIOLOGIST EVAL & MGMT  01/11/2020  . IR RADIOLOGIST EVAL & MGMT  04/04/2020  . IR US GUIDE VASC ACCESS RIGHT  12/24/2019  . LUMBAR LAMINECTOMY/DECOMPRESSION MICRODISCECTOMY N/A 10/18/2015   Procedure: LUMBAR LAMINECTOMY/DECOMPRESSION MICRODISCECTOMY 3 LEVELS;  Surgeon: Newman Pies, MD;  Location: [facility] NEURO ORS;  Service: Neurosurgery;  Laterality: N/A;  L23 L34 L45 laminectomy and foraminotomy  . NEPHRECTOMY     2012  . URETEROSCOPY WITH HOLMIUM LASER LITHOTRIPSY      Home Medications:  Allergies as of 04/19/2020      Reactions   Quinapril Cough   Ambien [zolpidem Tartrate] Other (See Comments)   Unspecified reaction   Fenofibrate Other (See Comments)   Unknown   Amoxicillin-pot Clavulanate Nausea And Vomiting   Morphine Nausea Only      Medication List  Accurate as of April 19, 2020  9:28 AM. If you have any questions, ask your nurse or doctor.        acetaminophen 325 MG tablet Commonly known as: TYLENOL Take 2 tablets (650 mg total) by mouth every 6 (six) hours as needed for mild pain (or Fever >/= 101).   albuterol 108 (90 Base) MCG/ACT inhaler Commonly known as: VENTOLIN HFA Inhale 2 puffs into the lungs every 6  (six) hours as needed for wheezing or shortness of breath.   aspirin EC 81 MG tablet Take 81 mg by mouth daily.   carbidopa-levodopa 50-200 MG tablet Commonly known as: SINEMET CR Take 1 tablet by mouth 2 (two) times daily.   carbidopa-levodopa 25-100 MG tablet Commonly known as: SINEMET IR Take 2 tablets by mouth 3 (three) times daily.   cetirizine 10 MG tablet Commonly known as: ZYRTEC Take 10 mg by mouth daily.   clonazePAM 0.5 MG tablet Commonly known as: KLONOPIN Take by mouth.   diltiazem 240 MG 24 hr capsule Commonly known as: CARDIZEM CD Take 240 mg by mouth daily.   donepezil 10 MG tablet Commonly known as: ARICEPT Take 10 mg by mouth every evening.   dutasteride 0.5 MG capsule Commonly known as: AVODART Take 0.5 mg by mouth daily.   fosfomycin 3 g Pack Commonly known as: MONUROL TAKE 1 SACHET BY MOUTH ONCE FOR 1 DOSE.   gabapentin 100 MG capsule Commonly known as: NEURONTIN Take 100 mg by mouth at bedtime.   melatonin 5 MG Tabs Take 5 mg by mouth at bedtime.   polyethylene glycol 17 g packet Commonly known as: MIRALAX / GLYCOLAX Take 17 g by mouth daily as needed for moderate constipation.   tamsulosin 0.4 MG Caps capsule Commonly known as: FLOMAX Take 1 capsule (0.4 mg total) by mouth daily.   traZODone 100 MG tablet Commonly known as: DESYREL Take 200 mg by mouth at bedtime.   venlafaxine XR 75 MG 24 hr capsule Commonly known as: EFFEXOR-XR Take 75 mg by mouth at bedtime.       Allergies:  Allergies  Allergen Reactions  . Quinapril Cough  . Ambien [Zolpidem Tartrate] Other (See Comments)    Unspecified reaction  . Fenofibrate Other (See Comments)    Unknown  . Amoxicillin-Pot Clavulanate Nausea And Vomiting  . Morphine Nausea Only    Family History: Family History  Problem Relation Age of Onset  . Bladder Cancer Neg Hx   . Kidney cancer Neg Hx   . Prolactinoma Neg Hx   . Prostate cancer Neg Hx     Social History:    reports that he has been smoking. He has a 50.00 pack-year smoking history. He has never used smokeless tobacco. He reports that he does not drink alcohol and does not use drugs.  Physical Exam: BP (!) 163/107   Pulse 75   Ht 5' 9.5" (1.765 m)   Wt 175 lb (79.4 kg)   BMI 25.47 kg/m   Constitutional:  Alert, no acute distress, nontoxic appearing HEENT: [place], AT Cardiovascular: No clubbing, cyanosis, or edema Respiratory: Normal respiratory effort, no increased work of breathing Skin: No rashes, bruises or suspicious lesions Neurologic: Shuffling gait, requires assist for transfers Psychiatric: Normal mood and affect  Laboratory Data: Results for orders placed or performed in visit on 04/19/20  Microscopic Examination   Urine  Result Value Ref Range   WBC, UA 6-10 (A) 0 - 5 /hpf   RBC 0-2 0 - 2 /hpf  Epithelial Cells (non renal) 0-10 0 - 10 /hpf   Casts Present (A) None seen /lpf   Cast Type Hyaline casts N/A   Bacteria, UA None seen None seen/Few  Urinalysis, Complete  Result Value Ref Range   Specific Gravity, UA 1.020 1.005 - 1.030   pH, UA 7.0 5.0 - 7.5   Color, UA Yellow Yellow   Appearance Ur Hazy (A) Clear   Leukocytes,UA 2+ (A) Negative   Protein,UA Trace (A) Negative/Trace   Glucose, UA Negative Negative   Ketones, UA Negative Negative   RBC, UA Negative Negative   Bilirubin, UA Negative Negative   Urobilinogen, Ur 0.2 0.2 - 1.0 mg/dL   Nitrite, UA Negative Negative   Microscopic Examination See below:   BLADDER SCAN AMB NON-IMAGING  Result Value Ref Range   Scan Result 53    Assessment & Plan:   1. Confusion UA with mild pyuria today, significantly improved over prior. Will send for culture for further evaluation, though my suspicion is low for persistent vs. reinfection today. Counseled wife that I would contact her with culture results, though I suspect his recent decline is not urologic in etiology. - Urinalysis, Complete - CULTURE, URINE  COMPREHENSIVE  2. Benign prostatic hyperplasia with urinary frequency PVR WNL two weeks following discontinuation of doxazosin and decrease of tamsulosin. OK to continue adjusted regimen. He is hypertensive in clinic today; counseled wife this may be due to recent med change. She plans to follow up with PCP. I am in agreement with this plan. - BLADDER SCAN AMB NON-IMAGING  Return if symptoms worsen or fail to improve.  Debroah Loop, PA-C  [facility] [address], [place], [place] [address] [phone]

## 2020-04-20 DIAGNOSIS — D329 Benign neoplasm of meninges, unspecified: Secondary | ICD-10-CM | POA: Diagnosis not present

## 2020-04-20 DIAGNOSIS — G4700 Insomnia, unspecified: Secondary | ICD-10-CM | POA: Diagnosis not present

## 2020-04-20 DIAGNOSIS — F0280 Dementia in other diseases classified elsewhere without behavioral disturbance: Secondary | ICD-10-CM | POA: Diagnosis not present

## 2020-04-20 DIAGNOSIS — I480 Paroxysmal atrial fibrillation: Secondary | ICD-10-CM | POA: Diagnosis not present

## 2020-04-20 DIAGNOSIS — G3183 Dementia with Lewy bodies: Secondary | ICD-10-CM | POA: Diagnosis not present

## 2020-04-21 LAB — CULTURE, URINE COMPREHENSIVE

## 2020-04-27 DIAGNOSIS — F0390 Unspecified dementia without behavioral disturbance: Secondary | ICD-10-CM | POA: Diagnosis not present

## 2020-04-28 ENCOUNTER — Telehealth: Payer: Self-pay | Admitting: Physician Assistant

## 2020-04-28 NOTE — Telephone Encounter (Signed)
I just had a lengthy conversation with Mrs. Dieudonne via telephone.  I explained that Mr. Hershman recent urine culture has grown ciprofloxacin and levofloxacin intermediate Pseudomonas aeruginosa.  I explained that the only medications that this bacteria is sensitive to our IV medications, so treating this urine culture would require IV antibiotics as a prolonged course of fluoroquinolones would not guarantee bacterial clearance and would increase his risk of adverse side effects.  On chart review, Mr. Plain has grown low colony counts of Pseudomonas aeruginosa on urine cultures since at least 2019.  Additionally, UA at the time of his most recent urine culture was benign.  I believe Mr. Vandeberg is colonized with Pseudomonas and his recent urine culture does not require treatment.  Mrs. Klatt is in agreement with this plan.  We discussed return precautions including gross hematuria, dysuria, lower abdominal pain, low back pain, fever, chills, nausea, and vomiting.  May consider ID consult in the future if we develop concern for drug-resistant recurrent UTIs.

## 2020-05-02 DIAGNOSIS — R590 Localized enlarged lymph nodes: Secondary | ICD-10-CM | POA: Diagnosis not present

## 2020-05-02 DIAGNOSIS — R4189 Other symptoms and signs involving cognitive functions and awareness: Secondary | ICD-10-CM | POA: Diagnosis not present

## 2020-05-05 ENCOUNTER — Ambulatory Visit: Payer: Self-pay | Admitting: Physician Assistant

## 2020-05-18 DIAGNOSIS — G3183 Dementia with Lewy bodies: Secondary | ICD-10-CM | POA: Diagnosis not present

## 2020-05-18 DIAGNOSIS — G4700 Insomnia, unspecified: Secondary | ICD-10-CM | POA: Diagnosis not present

## 2020-05-18 DIAGNOSIS — F0280 Dementia in other diseases classified elsewhere without behavioral disturbance: Secondary | ICD-10-CM | POA: Diagnosis not present

## 2020-05-18 DIAGNOSIS — D329 Benign neoplasm of meninges, unspecified: Secondary | ICD-10-CM | POA: Diagnosis not present

## 2020-05-19 ENCOUNTER — Other Ambulatory Visit: Payer: Self-pay

## 2020-05-19 ENCOUNTER — Encounter: Payer: Self-pay | Admitting: Emergency Medicine

## 2020-05-19 ENCOUNTER — Ambulatory Visit
Admission: EM | Admit: 2020-05-19 | Discharge: 2020-05-19 | Disposition: A | Discharge status: Home / Self Care | Payer: PPO | Attending: Emergency Medicine | Admitting: Emergency Medicine

## 2020-05-19 ENCOUNTER — Telehealth: Payer: Self-pay

## 2020-05-19 DIAGNOSIS — N390 Urinary tract infection, site not specified: Secondary | ICD-10-CM | POA: Insufficient documentation

## 2020-05-19 LAB — URINALYSIS, COMPLETE (UACMP) WITH MICROSCOPIC
Bilirubin Urine: NEGATIVE
Glucose, UA: NEGATIVE mg/dL
Hgb urine dipstick: NEGATIVE
Ketones, ur: 15 mg/dL — AB
Nitrite: NEGATIVE
Protein, ur: 30 mg/dL — AB
Specific Gravity, Urine: 1.025 (ref 1.005–1.030)
pH: 5.5 (ref 5.0–8.0)

## 2020-05-19 MED ORDER — DOXYCYCLINE HYCLATE 100 MG PO CAPS: 100.0000 mg | ORAL_CAPSULE | Freq: Two times a day (BID) | ORAL | 0 refills | Status: DC

## 2020-05-19 NOTE — ED Triage Notes (Signed)
Patient's wife states that her husband who has dementia has noticed that he seems a little more confused since yesterday.  Wife also states that he has c/o lower back pain for the past 3 days.  Wife states that his urine has been cloudy and darker in color.

## 2020-05-19 NOTE — Telephone Encounter (Signed)
Incoming call on triage line from patients wife with c/o lower back pain, dark/cloudly urine, and increased confusion in patient. She denies the patient having fever, chills, nausea or vomiting. Advised patient's wife that since its a Friday afternoon and only one provider in the office, she may need to take patient to Urgent Care or ED. Patients wife verbalized understanding.

## 2020-05-19 NOTE — ED Provider Notes (Signed)
[facility]    CSN: [id number] Arrival date & time: 05/19/20  1401      History   Chief Complaint Chief Complaint  Patient presents with   Back Pain   Altered Mental Status    HPI Russell Terry is a 79 y.o. male.   79 year old male here for evaluation of worsening confusion, low back pain x3 days, and cloudy urine.  Patient has a history of Lewy bodies dementia and Parkinson's.  Wife states that she noticed 3 days ago with the started to have some increased confusion.  She also noticed that his urine has been turning a darker yellow and this morning had some cloudiness to it.  Wife denies fever, nausea, vomiting, or changes in appetite.  Patient's wife reports that she does have a hard time getting him to drink but he will drink milk on occasion.  Patient last had a UTI on April 05, 2020.     Past Medical History:  Diagnosis Date   Atrial fibrillation ([place]) 12/11/2013   BPH (benign prostatic hyperplasia)    Bradycardia 12/11/2013   Calculus of kidney 12/06/2013   Cancer ([place])    rt kidney removed   Chronic kidney disease    stones,rt kidney removed   COPD (chronic obstructive pulmonary disease) ([place]) 12/11/2013   DDD (degenerative disc disease), lumbar 12/13/2013   Delirium    Dementia ([name])    Elevated prostate specific antigen (PSA) 03/03/2013   Encephalopathy acute 07/22/2016   Enlarged prostate with lower urinary tract symptoms (LUTS) 03/03/2013   Essential hypertension 07/22/2016   Hyperlipidemia, unspecified 07/09/2017   Hypertension    Meningioma ([place]) 10/08/2016   Parkinsonian features 10/08/2016   Pleuritic chest pain 05/31/2014   Renal cell carcinoma ([place]) 05/04/2016   Renal cyst, acquired, left 04/22/2015    Patient Active Problem List   Diagnosis Date Noted   Hematuria 04/21/2019   Urinary frequency 02/20/2019   Urinary urgency 02/20/2019   History of elevated PSA 02/20/2019   History of renal cell carcinoma  02/20/2019   UTI (urinary tract infection) 01/05/2019   Personal history of kidney cancer 01/08/2018   Hyperlipidemia, unspecified 07/09/2017   Anxiety, generalized 10/08/2016   Meningioma ([place]) 10/08/2016   Insomnia, persistent 10/08/2016   Parkinsonian features 10/08/2016   Agitation 07/22/2016   Encephalopathy acute 07/22/2016   Essential hypertension 07/22/2016   Pseudomonas urinary tract infection 07/22/2016   Alzheimer's dementia without behavioral disturbance ([employer]) 04/18/2016   Neck pain 11/15/2015   Lumbar stenosis with neurogenic claudication 10/18/2015   Renal cyst, acquired, left 04/22/2015   Pleuritic chest pain 05/31/2014   History of rectal bleeding 01/11/2014   History of kidney removal 12/22/2013   DDD (degenerative disc disease), lumbar 12/13/2013   Atrial fibrillation ([place]) 12/11/2013   Bradycardia 12/11/2013   COPD (chronic obstructive pulmonary disease) ([place]) 12/11/2013   Nephrolithiasis 12/06/2013   Other specified disorders of kidney and ureter 11/17/2013   Benign prostatic hyperplasia with lower urinary tract symptoms 03/03/2013   Elevated prostate specific antigen (PSA) 03/03/2013   Incomplete emptying of bladder 03/03/2013   Nocturia 03/03/2013    Past Surgical History:  Procedure Laterality Date   BRAIN SURGERY  2017   COLON SURGERY     COLONOSCOPY WITH PROPOFOL N/A 12/31/2016   Procedure: COLONOSCOPY WITH PROPOFOL;  Surgeon: Lollie Sails, MD;  Location: [facility] ENDOSCOPY;  Service: Endoscopy;  Laterality: N/A;   CYSTOSCOPY WITH FULGERATION     IR EMBO TUMOR ORGAN ISCHEMIA INFARCT INC GUIDE  ROADMAPPING  12/24/2019   IR RADIOLOGIST EVAL & MGMT  11/23/2019   IR RADIOLOGIST EVAL & MGMT  01/11/2020   IR RADIOLOGIST EVAL & MGMT  04/04/2020   IR US GUIDE VASC ACCESS RIGHT  12/24/2019   LUMBAR LAMINECTOMY/DECOMPRESSION MICRODISCECTOMY N/A 10/18/2015   Procedure: LUMBAR LAMINECTOMY/DECOMPRESSION MICRODISCECTOMY 3 LEVELS;   Surgeon: Newman Pies, MD;  Location: [place] [facility] NEURO ORS;  Service: Neurosurgery;  Laterality: N/A;  L23 L34 L45 laminectomy and foraminotomy   NEPHRECTOMY     2012   URETEROSCOPY WITH HOLMIUM LASER LITHOTRIPSY         Home Medications    Prior to Admission medications   Medication Sig Start Date End Date Taking? Authorizing Provider  albuterol (VENTOLIN HFA) 108 (90 Base) MCG/ACT inhaler Inhale 2 puffs into the lungs every 6 (six) hours as needed for wheezing or shortness of breath.  01/11/19  Yes [provider]  aspirin EC 81 MG tablet Take 81 mg by mouth daily.   Yes [provider]  carbidopa-levodopa (SINEMET CR) 50-200 MG tablet Take 1 tablet by mouth 2 (two) times daily.   Yes [provider]  diltiazem (CARDIZEM CD) 240 MG 24 hr capsule Take 240 mg by mouth daily.    Yes [provider]  donepezil (ARICEPT) 10 MG tablet Take 10 mg by mouth every evening.    Yes [provider]  dutasteride (AVODART) 0.5 MG capsule Take 0.5 mg by mouth daily.   Yes [provider]  Melatonin 5 MG TABS Take 15 mg by mouth at bedtime.    Yes [provider]  midodrine (PROAMATINE) 2.5 MG tablet Take by mouth. 05/18/20 05/18/21 Yes [provider]  tamsulosin (FLOMAX) 0.4 MG CAPS capsule Take 1 capsule (0.4 mg total) by mouth daily. 04/05/20  Yes Vaillancourt, Samantha, PA-C  traZODone (DESYREL) 100 MG tablet Take 100 mg by mouth at bedtime.  07/04/18  Yes [provider]  venlafaxine XR (EFFEXOR-XR) 75 MG 24 hr capsule Take 75 mg by mouth at bedtime.  04/14/18  Yes [provider]  acetaminophen (TYLENOL) 325 MG tablet Take 2 tablets (650 mg total) by mouth every 6 (six) hours as needed for mild pain (or Fever >/= 101). 04/25/19   Stark Jock Jude, MD  carbidopa-levodopa (SINEMET IR) 25-100 MG tablet Take 2 tablets by mouth 3 (three) times daily.  05/03/17   [provider]  cetirizine (ZYRTEC) 10 MG tablet Take 10  mg by mouth daily.    [provider]  doxycycline (VIBRAMYCIN) 100 MG capsule Take 1 capsule (100 mg total) by mouth 2 (two) times daily. 05/19/20   Margarette Canada, NP  fosfomycin (MONUROL) 3 g PACK TAKE 1 SACHET BY MOUTH ONCE FOR 1 DOSE. 04/05/20   [provider]  polyethylene glycol (MIRALAX / GLYCOLAX) 17 g packet Take 17 g by mouth daily as needed for moderate constipation.    [provider]  QUEtiapine (SEROQUEL) 25 MG tablet Take 25 mg by mouth every morning. 03/16/20   [provider]  clonazePAM (KLONOPIN) 0.5 MG tablet Take by mouth. 03/16/20 05/19/20  [provider]    Family History Family History  Problem Relation Age of Onset   Bladder Cancer Neg Hx    Kidney cancer Neg Hx    Prolactinoma Neg Hx    Prostate cancer Neg Hx     Social History Social History   Tobacco Use   Smoking status: Current Every Day Smoker    Packs/day: 1.00  Years: 50.00    Pack years: 50.00   Smokeless tobacco: Never Used  Vaping Use   Vaping Use: Never used  Substance Use Topics   Alcohol use: No   Drug use: No     Allergies   Quinapril, Ambien [zolpidem tartrate], Fenofibrate, Amoxicillin-pot clavulanate, and Morphine   Review of Systems Review of Systems  Constitutional: Negative for activity change, appetite change and fever.  HENT: Negative for rhinorrhea and sore throat.   Respiratory: Negative for shortness of breath.   Cardiovascular: Negative for chest pain.  Gastrointestinal: Negative for diarrhea, nausea and vomiting.  Genitourinary: Positive for frequency and urgency. Negative for hematuria.  Musculoskeletal: Positive for back pain.  Skin: Negative for rash.  Hematological: Negative.      Physical Exam Triage Vital Signs ED Triage Vitals  Enc Vitals Group     BP 05/19/20 1430 (!) 152/106     Pulse Rate 05/19/20 1430 64     Resp 05/19/20 1430 16     Temp 05/19/20 1430 98.1 F (36.7 C)     Temp Source 05/19/20  1430 Oral     SpO2 05/19/20 1430 97 %     Weight 05/19/20 1425 178 lb (80.7 kg)     Height 05/19/20 1425 5\' 7"  (1.702 m)     Head Circumference --      Peak Flow --      Pain Score --      Pain Loc --      Pain Edu? --      Excl. in [place]? --    No data found.  Updated Vital Signs BP (!) 152/106 (BP Location: Right Arm)    Pulse 64    Temp 98.1 F (36.7 C) (Oral)    Resp 16    Ht 5\' 7"  (1.702 m)    Wt 178 lb (80.7 kg)    SpO2 97%    BMI 27.88 kg/m   Visual Acuity Right Eye Distance:   Left Eye Distance:   Bilateral Distance:    Right Eye Near:   Left Eye Near:    Bilateral Near:     Physical Exam Vitals and nursing note reviewed.  Constitutional:      General: He is not in acute distress.    Appearance: Normal appearance. He is not toxic-appearing.  HENT:     Head: Normocephalic and atraumatic.  Eyes:     General: No scleral icterus.    Extraocular Movements: Extraocular movements intact.     Conjunctiva/sclera: Conjunctivae normal.     Pupils: Pupils are equal, round, and reactive to light.  Cardiovascular:     Rate and Rhythm: Normal rate and regular rhythm.     Pulses: Normal pulses.     Heart sounds: Normal heart sounds. No murmur heard.  No gallop.   Pulmonary:     Effort: Pulmonary effort is normal.     Breath sounds: Normal breath sounds. No wheezing, rhonchi or rales.  Abdominal:     Tenderness: There is no right CVA tenderness or left CVA tenderness.  Musculoskeletal:        General: No tenderness. Normal range of motion.  Skin:    General: Skin is warm and dry.     Capillary Refill: Capillary refill takes less than 2 seconds.     Findings: No erythema or rash.  Neurological:     Mental Status: He is alert. Mental status is at baseline.  Psychiatric:  Mood and Affect: Mood normal.        Behavior: Behavior normal.      UC Treatments / Results  Labs (all labs ordered are listed, but only abnormal results are displayed) Labs Reviewed    URINALYSIS, COMPLETE (UACMP) WITH MICROSCOPIC - Abnormal; Notable for the following components:      Result Value   APPearance CLOUDY (*)    Ketones, ur 15 (*)    Protein, ur 30 (*)    Leukocytes,Ua SMALL (*)    Bacteria, UA FEW (*)    All other components within normal limits  URINE CULTURE    EKG   Radiology No results found.  Procedures Procedures (including critical care time)  Medications Ordered in UC Medications - No data to display  Initial Impression / Assessment and Plan / UC Course  I have reviewed the triage vital signs and the nursing notes.  Pertinent labs & imaging results that were available during my care of the patient were reviewed by me and considered in my medical decision making (see chart for details).   Patient was brought in by his wife for evaluation of a possible UTI.  Patient been complaining of low back pain for the past 3 days and wife is noticed that he has had some increasing word finding difficulty and confusion.  He has history of Lewy bodies dementia and Parkinson's.  Patient's wife also noticed that his urine was a darker yellow and cloudy this morning.  Patient is not had a fever or GI complaints.  His appetite is good but she struggles to get him to drink water.  Patient was most recently treated for UTI on April 05, 2020 with fosfomycin 3 g as a single dose.  We will send UA for analysis.  UA shows 15 ketones, 30 protein, no nitrates, small leukocytes, 6-10 white blood cells, few bacteria, and mucus.  We will send UA for culture.  We will treat with doxycycline and have patient follow-up with his primary care.   Final Clinical Impressions(s) / UC Diagnoses   Final diagnoses:  Lower urinary tract infectious disease     Discharge Instructions     Take the doxycycline twice daily for 10 days.  Encourage patient to increase oral water intake to flush his urinary tract.  We will culture the urine and change the antibiotic if  necessary.  Follow-up with his primary care provider for new or worsening symptoms.    ED Prescriptions    Medication Sig Dispense Auth. Provider   doxycycline (VIBRAMYCIN) 100 MG capsule Take 1 capsule (100 mg total) by mouth 2 (two) times daily. 20 capsule Margarette Canada, NP     PDMP not reviewed this encounter.   Margarette Canada, NP 05/19/20 1534

## 2020-05-19 NOTE — Discharge Instructions (Addendum)
Take the doxycycline twice daily for 10 days.  Encourage patient to increase oral water intake to flush his urinary tract.  We will culture the urine and change the antibiotic if necessary.  Follow-up with his primary care provider for new or worsening symptoms.

## 2020-05-21 LAB — URINE CULTURE: Special Requests: NORMAL

## 2020-06-22 DIAGNOSIS — F0280 Dementia in other diseases classified elsewhere without behavioral disturbance: Secondary | ICD-10-CM | POA: Diagnosis not present

## 2020-06-22 DIAGNOSIS — Z23 Encounter for immunization: Secondary | ICD-10-CM | POA: Diagnosis not present

## 2020-06-22 DIAGNOSIS — F339 Major depressive disorder, recurrent, unspecified: Secondary | ICD-10-CM | POA: Diagnosis not present

## 2020-06-22 DIAGNOSIS — I10 Essential (primary) hypertension: Secondary | ICD-10-CM | POA: Diagnosis not present

## 2020-06-22 DIAGNOSIS — Z87440 Personal history of urinary (tract) infections: Secondary | ICD-10-CM | POA: Diagnosis not present

## 2020-06-22 DIAGNOSIS — R8290 Unspecified abnormal findings in urine: Secondary | ICD-10-CM | POA: Diagnosis not present

## 2020-06-22 DIAGNOSIS — Z0000 Encounter for general adult medical examination without abnormal findings: Secondary | ICD-10-CM | POA: Diagnosis not present

## 2020-06-22 DIAGNOSIS — G3183 Dementia with Lewy bodies: Secondary | ICD-10-CM | POA: Diagnosis not present

## 2020-06-22 DIAGNOSIS — G20 Parkinson's disease: Secondary | ICD-10-CM | POA: Diagnosis not present

## 2020-08-08 ENCOUNTER — Other Ambulatory Visit: Payer: Self-pay | Admitting: Physician Assistant

## 2020-08-08 ENCOUNTER — Encounter: Payer: Self-pay | Admitting: Physician Assistant

## 2020-08-08 ENCOUNTER — Other Ambulatory Visit: Payer: Self-pay

## 2020-08-08 ENCOUNTER — Ambulatory Visit: Payer: PPO | Admitting: Physician Assistant

## 2020-08-08 VITALS — BP 101/69 | HR 69 | Ht 69.0 in | Wt 180.0 lb

## 2020-08-08 DIAGNOSIS — N401 Enlarged prostate with lower urinary tract symptoms: Secondary | ICD-10-CM | POA: Diagnosis not present

## 2020-08-08 DIAGNOSIS — R319 Hematuria, unspecified: Secondary | ICD-10-CM | POA: Diagnosis not present

## 2020-08-08 DIAGNOSIS — N138 Other obstructive and reflux uropathy: Secondary | ICD-10-CM

## 2020-08-08 DIAGNOSIS — R310 Gross hematuria: Secondary | ICD-10-CM | POA: Diagnosis not present

## 2020-08-08 LAB — BLADDER SCAN AMB NON-IMAGING

## 2020-08-08 MED ORDER — FOSFOMYCIN TROMETHAMINE 3 G PO PACK: 3.0000 g | PACK | Freq: Once | ORAL | 0 refills | Status: DC

## 2020-08-08 NOTE — Progress Notes (Unsigned)
08/08/2020 10:09 AM   Russell Terry 1941/05/21 [id number]  CC: Chief Complaint  Patient presents with  . Hematuria   HPI: Russell Terry is a 80 y.o. male with PMH BPH with LUTS s/p prostatic artery embolization in 2021 on tamsulosin 0.4 mg daily and dutasteride 0.5 mg daily, elevated PSA with benign biopsy, recurrent nephrolithiasis, possible Pseudomonas urinary colonization, T1b RCC s/p right laparoscopic nephrectomy in 2015, Parkinson's disease, and Lewy body dementia who presents today for evaluation of possible UTI.  He is accompanied today by his wife, who contributes to HPI.  Today he reports a 2 to 3-week history of dysuria, frequency, urgency, and occasional drops of blood visualized in his depends.  Patient and wife have been unable to confirm that the blood is coming from his bladder, however wife reports she did visualize his groin and noted no skin tears or other wounds that could be the possible source.  She also notes he has had decreased appetite and has been sleeping more over this time.  They deny fever, chills, nausea, or vomiting.  Additionally, Mrs. Mells wonders today if we may consider stopping tamsulosin or dutasteride.  I recently stopped doxazosin and decreased tamsulosin from 0.8 mg daily after he underwent prostatic artery embolization with stable PVR afterward.  In-office UA today positive for trace lysed blood, trace protein, and 2+ leukocyte esterase; urine microscopy with >30 WBCs/HPF and 3-10 RBCs/HPF. PVR 66mL.  PMH: Past Medical History:  Diagnosis Date  . Atrial fibrillation ([place]) 12/11/2013  . BPH (benign prostatic hyperplasia)   . Bradycardia 12/11/2013  . Calculus of kidney 12/06/2013  . Cancer ([place])    rt kidney removed  . Chronic kidney disease    stones,rt kidney removed  . COPD (chronic obstructive pulmonary disease) ([place]) 12/11/2013  . DDD (degenerative disc disease), lumbar 12/13/2013  . Delirium   . Dementia ([place])   .  Elevated prostate specific antigen (PSA) 03/03/2013  . Encephalopathy acute 07/22/2016  . Enlarged prostate with lower urinary tract symptoms (LUTS) 03/03/2013  . Essential hypertension 07/22/2016  . Hyperlipidemia, unspecified 07/09/2017  . Hypertension   . Meningioma ([place]) 10/08/2016  . Parkinsonian features 10/08/2016  . Pleuritic chest pain 05/31/2014  . Renal cell carcinoma ([place]) 05/04/2016  . Renal cyst, acquired, left 04/22/2015    Surgical History: Past Surgical History:  Procedure Laterality Date  . BRAIN SURGERY  2017  . COLON SURGERY    . COLONOSCOPY WITH PROPOFOL N/A 12/31/2016   Procedure: COLONOSCOPY WITH PROPOFOL;  Surgeon: Lollie Sails, MD;  Location: [facility] ENDOSCOPY;  Service: Endoscopy;  Laterality: N/A;  . [place]    . IR EMBO TUMOR ORGAN ISCHEMIA INFARCT INC GUIDE ROADMAPPING  12/24/2019  . IR RADIOLOGIST EVAL & MGMT  11/23/2019  . IR RADIOLOGIST EVAL & MGMT  01/11/2020  . IR RADIOLOGIST EVAL & MGMT  04/04/2020  . IR US GUIDE VASC ACCESS RIGHT  12/24/2019  . LUMBAR LAMINECTOMY/DECOMPRESSION MICRODISCECTOMY N/A 10/18/2015   Procedure: LUMBAR LAMINECTOMY/DECOMPRESSION MICRODISCECTOMY 3 LEVELS;  Surgeon: Newman Pies, MD;  Location: [place] NEURO ORS;  Service: Neurosurgery;  Laterality: N/A;  L23 L34 L45 laminectomy and foraminotomy  . NEPHRECTOMY     2012  . URETEROSCOPY WITH HOLMIUM LASER LITHOTRIPSY      Home Medications:  Allergies as of 08/08/2020      Reactions   Quinapril Cough   Ambien [zolpidem Tartrate] Other (See Comments)   Unspecified reaction   Fenofibrate Other (See Comments)   Unknown  Amoxicillin-pot Clavulanate Nausea And Vomiting   Morphine Nausea Only      Medication List       Accurate as of August 08, 2020 10:09 AM. If you have any questions, ask your nurse or doctor.        STOP taking these medications   doxycycline 100 MG capsule Commonly known as: VIBRAMYCIN Stopped by: Debroah Loop, PA-C   fosfomycin  3 g Pack Commonly known as: MONUROL Stopped by: Debroah Loop, PA-C     TAKE these medications   acetaminophen 325 MG tablet Commonly known as: TYLENOL Take 2 tablets (650 mg total) by mouth every 6 (six) hours as needed for mild pain (or Fever >/= 101).   albuterol 108 (90 Base) MCG/ACT inhaler Commonly known as: VENTOLIN HFA Inhale 2 puffs into the lungs every 6 (six) hours as needed for wheezing or shortness of breath.   aspirin EC 81 MG tablet Take 81 mg by mouth daily.   carbidopa-levodopa 50-200 MG tablet Commonly known as: SINEMET CR Take 1 tablet by mouth 2 (two) times daily.   carbidopa-levodopa 25-100 MG tablet Commonly known as: SINEMET IR Take 2 tablets by mouth 3 (three) times daily.   cetirizine 10 MG tablet Commonly known as: ZYRTEC Take 10 mg by mouth daily.   diltiazem 240 MG 24 hr capsule Commonly known as: CARDIZEM CD Take 240 mg by mouth daily.   donepezil 10 MG tablet Commonly known as: ARICEPT Take 10 mg by mouth every evening.   dutasteride 0.5 MG capsule Commonly known as: AVODART Take 0.5 mg by mouth daily.   melatonin 5 MG Tabs Take 15 mg by mouth at bedtime.   midodrine 2.5 MG tablet Commonly known as: PROAMATINE Take by mouth.   polyethylene glycol 17 g packet Commonly known as: MIRALAX / GLYCOLAX Take 17 g by mouth daily as needed for moderate constipation.   QUEtiapine 25 MG tablet Commonly known as: SEROQUEL Take 25 mg by mouth every morning.   tamsulosin 0.4 MG Caps capsule Commonly known as: FLOMAX Take 1 capsule (0.4 mg total) by mouth daily.   traZODone 100 MG tablet Commonly known as: DESYREL Take 100 mg by mouth at bedtime.   venlafaxine XR 75 MG 24 hr capsule Commonly known as: EFFEXOR-XR Take 75 mg by mouth at bedtime.       Allergies:  Allergies  Allergen Reactions  . Quinapril Cough  . Ambien [Zolpidem Tartrate] Other (See Comments)    Unspecified reaction  . Fenofibrate Other (See Comments)     Unknown  . Amoxicillin-Pot Clavulanate Nausea And Vomiting  . Morphine Nausea Only    Family History: Family History  Problem Relation Age of Onset  . Bladder Cancer Neg Hx   . Kidney cancer Neg Hx   . Prolactinoma Neg Hx   . Prostate cancer Neg Hx     Social History:   reports that he has been smoking. He has a 50.00 pack-year smoking history. He has never used smokeless tobacco. He reports that he does not drink alcohol and does not use drugs.  Physical Exam: BP 101/69   Pulse 69   Ht 5\' 9"  (1.753 m)   Wt 180 lb (81.6 kg)   BMI 26.58 kg/m   Constitutional:  Alert, no acute distress, nontoxic appearing HEENT: [place], AT Cardiovascular: No clubbing, cyanosis, or edema Respiratory: Normal respiratory effort, no increased work of breathing Skin: No rashes, bruises or suspicious lesions Neurologic: Slumped in wheelchair Psychiatric: Normal mood and affect  Laboratory Data: Results for orders placed or performed in visit on 08/08/20  Microscopic Examination   Urine  Result Value Ref Range   WBC, UA >30 (A) 0 - 5 /hpf   RBC 3-10 (A) 0 - 2 /hpf   Epithelial Cells (non renal) 0-10 0 - 10 /hpf   Casts Present (A) None seen /lpf   Cast Type Hyaline casts N/A   Bacteria, UA Few None seen/Few  Urinalysis, Complete  Result Value Ref Range   Specific Gravity, UA 1.020 1.005 - 1.030   pH, UA 7.0 5.0 - 7.5   Color, UA Yellow Yellow   Appearance Ur Cloudy (A) Clear   Leukocytes,UA 2+ (A) Negative   Protein,UA Trace (A) Negative/Trace   Glucose, UA Negative Negative   Ketones, UA Negative Negative   RBC, UA Trace (A) Negative   Bilirubin, UA Negative Negative   Urobilinogen, Ur 0.2 0.2 - 1.0 mg/dL   Nitrite, UA Negative Negative   Microscopic Examination See below:   BLADDER SCAN AMB NON-IMAGING  Result Value Ref Range   Scan Result 61mL    Assessment & Plan:   1. Gross hematuria 2 to 3-week history of dysuria, frequency, urgency, decreased appetite, and fatigue with  drops of blood noted in his depends.  UA today notable for pyuria and microscopic hematuria.  He has a history of possible Pseudomonas colonization, however he does appear to be clinically infected today.  I have prescribed a dose of empiric fosfomycin and will send for culture for further evaluation.  Patient and wife are in agreement with this plan. - Urinalysis, Complete - BLADDER SCAN AMB NON-IMAGING - CULTURE, URINE COMPREHENSIVE   2. Benign prostatic hyperplasia with urinary obstruction Okay to discontinue Flomax at this time due to concerns for increased fall risk in this patient.  We will plan for repeat bladder scan off Flomax in about 3 weeks.  If stable, okay to stay off Flomax.  I do recommend continuing doxazosin to decrease his risk of prostatic bleeding.  Return in about 3 weeks (around 08/29/2020) for Bladder scan off Flomax.  Debroah Loop, PA-C  [facility] [address], [place], [place] [address] [phone]

## 2020-08-09 LAB — MICROSCOPIC EXAMINATION: WBC, UA: >30 /hpf — AB (ref 0–5)

## 2020-08-09 LAB — URINALYSIS, COMPLETE
Bilirubin, UA: NEGATIVE
Glucose, UA: NEGATIVE
Ketones, UA: NEGATIVE
Nitrite, UA: NEGATIVE
Specific Gravity, UA: 1.02 (ref 1.005–1.030)
Urobilinogen, Ur: 0.2 mg/dL (ref 0.2–1.0)
pH, UA: 7 (ref 5.0–7.5)

## 2020-08-12 LAB — CULTURE, URINE COMPREHENSIVE

## 2020-08-14 ENCOUNTER — Telehealth: Payer: Self-pay

## 2020-08-14 NOTE — Telephone Encounter (Signed)
-----   Message from Debroah Loop, [place] sent at 08/14/2020 12:47 PM EST ----- Can you please contact them to see if he's doing any better? Ucx is negative, but if his symptoms and the drops of blood have resolved, it's possible this is a false negative. ----- Message ----- From: Interface, [facility] Lab Results In Sent: 08/09/2020   5:37 AM EST To: Debroah Loop, PA-C

## 2020-08-14 NOTE — Telephone Encounter (Signed)
LMOM for patient to call back. See note.

## 2020-08-22 DIAGNOSIS — F0280 Dementia in other diseases classified elsewhere without behavioral disturbance: Secondary | ICD-10-CM | POA: Diagnosis not present

## 2020-08-22 DIAGNOSIS — G3183 Dementia with Lewy bodies: Secondary | ICD-10-CM | POA: Diagnosis not present

## 2020-08-29 ENCOUNTER — Ambulatory Visit (INDEPENDENT_AMBULATORY_CARE_PROVIDER_SITE_OTHER): Payer: PPO | Admitting: Physician Assistant

## 2020-08-29 ENCOUNTER — Encounter: Payer: Self-pay | Admitting: Physician Assistant

## 2020-08-29 ENCOUNTER — Other Ambulatory Visit: Payer: Self-pay

## 2020-08-29 VITALS — BP 121/83 | HR 72 | Ht 70.0 in | Wt 180.0 lb

## 2020-08-29 DIAGNOSIS — N138 Other obstructive and reflux uropathy: Secondary | ICD-10-CM

## 2020-08-29 DIAGNOSIS — R310 Gross hematuria: Secondary | ICD-10-CM

## 2020-08-29 DIAGNOSIS — N401 Enlarged prostate with lower urinary tract symptoms: Secondary | ICD-10-CM

## 2020-08-29 LAB — BLADDER SCAN AMB NON-IMAGING

## 2020-08-29 NOTE — Patient Instructions (Signed)
Continue dutasteride. Stay off Flomax. I'll call you with your renal ultrasound results when I get them.

## 2020-08-29 NOTE — Progress Notes (Signed)
08/29/2020 10:37 AM   Russell Terry [birth year] [id number]  CC: Chief Complaint  Patient presents with  . Follow-up   HPI: [place] Terry is a 80 y.o. male with PMH BPH with LUTS s/p prostatic artery embolization in 2021 previously on tamsulosin 0.4 mg daily and dutasteride 0.5 mg daily, elevated PSA with benign biopsy, recurrent nephrolithiasis, possible Pseudomonas urinary colonization, T1b RCC s/p right laparoscopic nephrectomy in 2015, Parkinson's disease, and Lewy body dementia who presents today for symptom recheck and PVR after having discontinued Flomax.  He is accompanied today by his wife, who contributes to HPI.  Today he reports no acute concerns.  His previously reported spots of blood in his underwear have resolved since his last clinic visit.  Russell Terry reports that he occasionally complains of back versus flank pain. Unfortunately due to his declining cognitive status 2/2 dementia, Russell Terry expresses concern about his reliability in reporting discomfort. PVR 14 mL.  PMH: Past Medical History:  Diagnosis Date  . Atrial fibrillation ([place]) 12/11/2013  . BPH (benign prostatic hyperplasia)   . Bradycardia 12/11/2013  . Calculus of kidney 12/06/2013  . Cancer ([place])    rt kidney removed  . Chronic kidney disease    stones,rt kidney removed  . COPD (chronic obstructive pulmonary disease) ([place]) 12/11/2013  . DDD (degenerative disc disease), lumbar 12/13/2013  . Delirium   . Dementia ([place])   . Elevated prostate specific antigen (PSA) 03/03/2013  . Encephalopathy acute 07/22/2016  . Enlarged prostate with lower urinary tract symptoms (LUTS) 03/03/2013  . Essential hypertension 07/22/2016  . Hyperlipidemia, unspecified 07/09/2017  . Hypertension   . Meningioma ([place]) 10/08/2016  . Parkinsonian features 10/08/2016  . Pleuritic chest pain 05/31/2014  . Renal cell carcinoma ([place]) 05/04/2016  . Renal cyst, acquired, left 04/22/2015    Surgical History: Past  Surgical History:  Procedure Laterality Date  . BRAIN SURGERY  2017  . COLON SURGERY    . COLONOSCOPY WITH PROPOFOL N/A 12/31/2016   Procedure: COLONOSCOPY WITH PROPOFOL;  Surgeon: Lollie Sails, MD;  Location[facility] ENDOSCOPY;  Service: Endoscopy;  Laterality: N/A;  . [place]    . IR EMBO TUMOR ORGAN ISCHEMIA INFARCT INC GUIDE ROADMAPPING  12/24/2019  . IR RADIOLOGIST EVAL & MGMT  11/23/2019  . IR RADIOLOGIST EVAL & MGMT  01/11/2020  . IR RADIOLOGIST EVAL & MGMT  04/04/2020  . IR US GUIDE VASC ACCESS RIGHT  12/24/2019  . LUMBAR LAMINECTOMY/DECOMPRESSION MICRODISCECTOMY N/A 10/18/2015   Procedure: LUMBAR LAMINECTOMY/DECOMPRESSION MICRODISCECTOMY 3 LEVELS;  Surgeon: Newman Pies, MD;  Location: [facility] NEURO ORS;  Service: Neurosurgery;  Laterality: N/A;  L23 L34 L45 laminectomy and foraminotomy  . NEPHRECTOMY     2012  . URETEROSCOPY WITH HOLMIUM LASER LITHOTRIPSY      Home Medications:  Allergies as of 08/29/2020      Reactions   Quinapril Cough   Ambien [zolpidem Tartrate] Other (See Comments)   Unspecified reaction   Fenofibrate Other (See Comments)   Unknown   Amoxicillin-pot Clavulanate Nausea And Vomiting   Morphine Nausea Only      Medication List       Accurate as of August 29, 2020 10:37 AM. If you have any questions, ask your nurse or doctor.        acetaminophen 325 MG tablet Commonly known as: TYLENOL Take 2 tablets (650 mg total) by mouth every 6 (six) hours as needed for mild pain (or Fever >/= 101).   albuterol 108 (90  Base) MCG/ACT inhaler Commonly known as: VENTOLIN HFA Inhale 2 puffs into the lungs every 6 (six) hours as needed for wheezing or shortness of breath.   aspirin EC 81 MG tablet Take 81 mg by mouth daily.   carbidopa-levodopa 50-200 MG tablet Commonly known as: SINEMET CR Take 1 tablet by mouth 2 (two) times daily.   carbidopa-levodopa 25-100 MG tablet Commonly known as: SINEMET IR Take 2 tablets by mouth 3 (three)  times daily.   cetirizine 10 MG tablet Commonly known as: ZYRTEC Take 10 mg by mouth daily.   diltiazem 240 MG 24 hr capsule Commonly known as: CARDIZEM CD Take 240 mg by mouth daily.   donepezil 10 MG tablet Commonly known as: ARICEPT Take 10 mg by mouth every evening.   dutasteride 0.5 MG capsule Commonly known as: AVODART Take 0.5 mg by mouth daily.   melatonin 5 MG Tabs Take 15 mg by mouth at bedtime.   midodrine 2.5 MG tablet Commonly known as: PROAMATINE Take by mouth.   polyethylene glycol 17 g packet Commonly known as: MIRALAX / GLYCOLAX Take 17 g by mouth daily as needed for moderate constipation.   QUEtiapine 25 MG tablet Commonly known as: SEROQUEL Take 25 mg by mouth every morning.   rivastigmine 4.6 mg/24hr Commonly known as: EXELON 4.6 mg daily.   traZODone 100 MG tablet Commonly known as: DESYREL Take 100 mg by mouth at bedtime.   venlafaxine XR 75 MG 24 hr capsule Commonly known as: EFFEXOR-XR Take 75 mg by mouth at bedtime.       Allergies:  Allergies  Allergen Reactions  . Quinapril Cough  . Ambien [Zolpidem Tartrate] Other (See Comments)    Unspecified reaction  . Fenofibrate Other (See Comments)    Unknown  . Amoxicillin-Pot Clavulanate Nausea And Vomiting  . Morphine Nausea Only    Family History: Family History  Problem Relation Age of Onset  . Bladder Cancer Neg Hx   . Kidney cancer Neg Hx   . Prolactinoma Neg Hx   . Prostate cancer Neg Hx     Social History:   reports that he has been smoking. He has a 50.00 pack-year smoking history. He has never used smokeless tobacco. He reports that he does not drink alcohol and does not use drugs.  Physical Exam: BP 121/83   Pulse 72   Ht 5\' 10"  (1.778 m)   Wt 180 lb (81.6 kg)   BMI 25.83 kg/m   Constitutional:  Awake, no acute distress, nontoxic appearing HEENT: [place], AT Cardiovascular: No clubbing, cyanosis, or edema Respiratory: Normal respiratory effort, no increased work  of breathing Skin: No rashes, bruises or suspicious lesions Psychiatric: Dementia  Laboratory Data: Results for orders placed or performed in visit on 08/29/20  Bladder Scan (Post Void Residual) in office  Result Value Ref Range   Scan Result 75mL    Assessment & Plan:   1. Benign prostatic hyperplasia with urinary obstruction PVR remains WNL off Flomax.  Counseled patient to continue dutasteride.  No indication to restart Flomax at this time. - Bladder Scan (Post Void Residual) in office  2. Hematuria, gross Hematuria likely prostatic in origin, however given his solitary kidney and history of nephrolithiasis, I recommended a renal ultrasound to rule out hydronephrosis, which may be an indication of obstructive nephrolithiasis.  Patient and wife are in agreement with this plan - US RENAL; Future   Return in about 6 weeks (around 10/10/2020) for Routine f/u with Dr. Bernardo Heater.  Aldona Bar  PA-C  [facility] [address], [address] [place], [place] [address] [phone] 

## 2020-09-11 ENCOUNTER — Ambulatory Visit: Payer: PPO

## 2020-09-15 DIAGNOSIS — J449 Chronic obstructive pulmonary disease, unspecified: Secondary | ICD-10-CM | POA: Diagnosis not present

## 2020-09-15 DIAGNOSIS — I739 Peripheral vascular disease, unspecified: Secondary | ICD-10-CM | POA: Diagnosis not present

## 2020-09-15 DIAGNOSIS — M4800 Spinal stenosis, site unspecified: Secondary | ICD-10-CM | POA: Diagnosis not present

## 2020-09-15 DIAGNOSIS — I4891 Unspecified atrial fibrillation: Secondary | ICD-10-CM | POA: Diagnosis not present

## 2020-09-15 DIAGNOSIS — G3183 Dementia with Lewy bodies: Secondary | ICD-10-CM | POA: Diagnosis not present

## 2020-09-15 DIAGNOSIS — D692 Other nonthrombocytopenic purpura: Secondary | ICD-10-CM | POA: Diagnosis not present

## 2020-09-15 DIAGNOSIS — F0280 Dementia in other diseases classified elsewhere without behavioral disturbance: Secondary | ICD-10-CM | POA: Diagnosis not present

## 2020-09-15 DIAGNOSIS — F17200 Nicotine dependence, unspecified, uncomplicated: Secondary | ICD-10-CM | POA: Diagnosis not present

## 2020-09-15 DIAGNOSIS — N2889 Other specified disorders of kidney and ureter: Secondary | ICD-10-CM | POA: Diagnosis not present

## 2020-09-15 DIAGNOSIS — Z85528 Personal history of other malignant neoplasm of kidney: Secondary | ICD-10-CM | POA: Diagnosis not present

## 2020-09-15 DIAGNOSIS — D6869 Other thrombophilia: Secondary | ICD-10-CM | POA: Diagnosis not present

## 2020-09-15 DIAGNOSIS — N183 Chronic kidney disease, stage 3 unspecified: Secondary | ICD-10-CM | POA: Diagnosis not present

## 2020-10-27 ENCOUNTER — Telehealth: Payer: Self-pay | Admitting: Primary Care

## 2020-10-27 NOTE — Telephone Encounter (Signed)
Spoke with wife, [name], regarding the Palliative referral/services and she was in agreement with scheduling visit, all questions were answered.  I have scheduled an In-home Consult for 10/31/20 @ 12:30 PM

## 2020-10-31 ENCOUNTER — Other Ambulatory Visit: Payer: Self-pay

## 2020-10-31 ENCOUNTER — Encounter: Payer: Self-pay | Admitting: Primary Care

## 2020-10-31 ENCOUNTER — Other Ambulatory Visit: Payer: PPO | Admitting: Primary Care

## 2020-10-31 DIAGNOSIS — G309 Alzheimer's disease, unspecified: Secondary | ICD-10-CM | POA: Diagnosis not present

## 2020-10-31 DIAGNOSIS — R259 Unspecified abnormal involuntary movements: Secondary | ICD-10-CM | POA: Diagnosis not present

## 2020-10-31 DIAGNOSIS — R29818 Other symptoms and signs involving the nervous system: Secondary | ICD-10-CM

## 2020-10-31 DIAGNOSIS — Z515 Encounter for palliative care: Secondary | ICD-10-CM

## 2020-10-31 DIAGNOSIS — B372 Candidiasis of skin and nail: Secondary | ICD-10-CM | POA: Diagnosis not present

## 2020-10-31 DIAGNOSIS — F0280 Dementia in other diseases classified elsewhere without behavioral disturbance: Secondary | ICD-10-CM | POA: Diagnosis not present

## 2020-10-31 NOTE — Progress Notes (Signed)
[facility] Pall[facility] Consult Note Telephone: [phone]  Fax: [phone]    Date of encounter: 10/31/20 PATIENT NAME: [place] Terry [address] [place] [address]   [phone] (home)  DOB: 12-28-1940 MRN: [id number] PRIMARY CARE PROVIDER:    [place] Pink, MD,  [place] [address] [phone]  REFERRING PROVIDER:   Mary[place] Pink, MD [address] [place],  [place] [address] [phone]  RESPONSIBLE PARTY: Contact Information    Name Relation Home Work Mobile   Russell Terry Spouse [phone]  [phone]   Russell Terry Sister   [phone]   Russell Terry Daughter   [phone]       I met face to face with patient and family in  home. Palliative Care was asked to follow this patient by consultation request of  Russell Pink, MD to address advance care planning and complex medical decision making. This is the initial visit.                                     ASSESSMENT AND PLAN / RECOMMENDATIONS:   Advance Care Planning/Goals of Care: Goals include to maximize quality of life and symptom management. Our advance care planning conversation included a discussion about:     The value and importance of advance care planning   Experiences with loved ones who have been seriously ill or have died   Exploration of personal, cultural or spiritual beliefs that might influence medical decisions   Exploration of goals of care in the event of a sudden injury or illness   Identification  of a healthcare agent = wife  Creation of an  advance directive document .=MOST and DNR  Decision not to resuscitate and  to de-escalate disease focused treatments due to poor prognosis.  CODE STATUS: DNR, MOST completed and uploaded to [facility], DNR, limited scope, use of abx, limited IV, no feeding tube.  Symptom Management/Plan:  Nutrition: He is gaining as he  forgets he has eaten and wants to eat more. We discussed looking for reduced calorie options and giving him liquids low in sugar. This over- eating is driven by his dementia.He also does not consume enough fluids. We discussed ways of getting fluids into him such as  shaved ice but with sugar restriction for better nutrition.   Yeast infection of the L groin: I was able to examine area, wife endorses some resolution with nystatin but it returns frequently. I directed her to buy interdry, and have written and sent Rx for terbinafine cream 1% to affected area, bid x 2-4 weeks, refills 2, # 30 gm. I will assess resolution on next visit and instructed wife to reach out if it worsens.   Caregiver strain: Wife is Special educational needs teacher. She has a sister in the area but no help from other family regularly. Pt has had dementia for some years, and she must now take him with her on errands.Wife outlined his history with Lewy body hallucinations. This continued into Parkinson's symptoms of rigidity, bradykinesia. There are caregiver issues in that she is so caregiver and his requirements are increasing. However we discussed the importance of her care self-care and hiring help to be able to go out and get a break.   Immobility : Bradykinesia patient has difficulty changing positions in his recliner and must be frequently repositioned. I recommend  review of Sinemet if it hasn't recently been updated. He has a lift chair which will help his wife with transferring to a wheelchair.    Follow up Palliative Care Visit: Palliative care will continue to follow for complex medical decision making, advance care planning, and clarification of goals. Return 4-6 weeks or prn.  I spent 75 minutes providing this consultation. More than 50% of the time in this consultation was spent in counseling and care coordination.  PPS: 30%  HOSPICE ELIGIBILITY/DIAGNOSIS: TBD  Chief Complaint: Frailty, debility  HISTORY OF PRESENT  ILLNESS:  Russell Terry is a 80 y.o. year old male  with h/o Lewy body dementia, PD, other dementia, current tobacco abuse per vaping. Pt has had increase in debility and increase caregiving needs. Wife is chief caregiver and needs assistance since pt is unable to walk or transfer (I).  Wife endorses rigidity in context of PD, not affected much by medications. This is constant and poses a fall risk, and difficulty to transfer/change positions.   History obtained from review of EMR, discussion with primary team, and interview with family, facility staff/caregiver and/or [place] Terry.  I reviewed available labs, medications, imaging, studies and related documents from the EMR.  Records reviewed and summarized above.   ROS   General: NAD ENMT: denies dysphagia Cardiovascular: denies chest pain, denies DOE Pulmonary: denies cough, denies increased SOB Abdomen: endorses good appetite, denies constipation, endorses incontinence of bowel GU: denies dysuria, endorses incontinence of urine MSK:  endorsesweakness,  no falls reported Skin: Groin candidiasis rash Neurological: denies pain, denies insomnia Psych: Endorses positive mood at times, other times resistant to care esp toileting Heme/lymph/immuno: denies bruises, abnormal bleeding  Physical Exam: Current and past weights:stable, estimated 190-195 lbs Constitutional: NAD General: frail appearing,WNWD  EYES: anicteric sclera, lids intact, no discharge  ENMT: intact hearing, oral mucous membranes moist, dentition intact CV: S1S2, RRR, no LE edema Pulmonary: LCTA, no increased work of breathing, no cough, room air Abdomen: intake 100%, normo-active BS + 4 quadrants, soft and non tender, no ascites MSK: mod sarcopenia, moves all extremities with rigidity, non ambulatory, can pivot transfer Skin: warm and dry, no rashes or wounds on visible skin Neuro:  + generalized weakness,  Severe cognitive impairment Psych: non-anxious affect, A  and O x 1 Hem/lymph/immuno: no widespread bruising   CURRENT PROBLEM LIST:  Patient Active Problem List   Diagnosis Date Noted  . Hematuria 04/21/2019  . Urinary frequency 02/20/2019  . Urinary urgency 02/20/2019  . History of elevated PSA 02/20/2019  . History of renal cell carcinoma 02/20/2019  . UTI (urinary tract infection) 01/05/2019  . Personal history of kidney cancer 01/08/2018  . Hyperlipidemia, unspecified 07/09/2017  . Anxiety, generalized 10/08/2016  . Meningioma ([place]) 10/08/2016  . Insomnia, persistent 10/08/2016  . Parkinsonian features 10/08/2016  . Agitation 07/22/2016  . Encephalopathy acute 07/22/2016  . Essential hypertension 07/22/2016  . Pseudomonas urinary tract infection 07/22/2016  . Alzheimer's dementia without behavioral disturbance ([place]) 04/18/2016  . Neck pain 11/15/2015  . Lumbar stenosis with neurogenic claudication 10/18/2015  . Renal cyst, acquired, left 04/22/2015  . Pleuritic chest pain 05/31/2014  . History of rectal bleeding 01/11/2014  . History of kidney removal 12/22/2013  . DDD (degenerative disc disease), lumbar 12/13/2013  . Atrial fibrillation ([place]) 12/11/2013  . Bradycardia 12/11/2013  . COPD (chronic obstructive pulmonary disease) ([place]) 12/11/2013  . Nephrolithiasis 12/06/2013  . Other specified disorders of kidney and ureter 11/17/2013  . Benign prostatic hyperplasia with lower  urinary tract symptoms 03/03/2013  . Elevated prostate specific antigen (PSA) 03/03/2013  . Incomplete emptying of bladder 03/03/2013  . Nocturia 03/03/2013   PAST MEDICAL HISTORY:  Active Ambulatory Problems    Diagnosis Date Noted  . Lumbar stenosis with neurogenic claudication 10/18/2015  . Agitation 07/22/2016  . Alzheimer's dementia without behavioral disturbance ([place]) 04/18/2016  . Anxiety, generalized 10/08/2016  . Atrial fibrillation ([place]) 12/11/2013  . Meningioma ([place]) 10/08/2016  . Benign prostatic hyperplasia with lower urinary tract  symptoms 03/03/2013  . Bradycardia 12/11/2013  . COPD (chronic obstructive pulmonary disease) ([place]) 12/11/2013  . DDD (degenerative disc disease), lumbar 12/13/2013  . Elevated prostate specific antigen (PSA) 03/03/2013  . Encephalopathy acute 07/22/2016  . Essential hypertension 07/22/2016  . History of kidney removal 12/22/2013  . History of rectal bleeding 01/11/2014  . Hyperlipidemia, unspecified 07/09/2017  . Incomplete emptying of bladder 03/03/2013  . Insomnia, persistent 10/08/2016  . Neck pain 11/15/2015  . Nocturia 03/03/2013  . Other specified disorders of kidney and ureter 11/17/2013  . Parkinsonian features 10/08/2016  . Pleuritic chest pain 05/31/2014  . Pseudomonas urinary tract infection 07/22/2016  . Renal cyst, acquired, left 04/22/2015  . Nephrolithiasis 12/06/2013  . Personal history of kidney cancer 01/08/2018  . UTI (urinary tract infection) 01/05/2019  . Urinary frequency 02/20/2019  . Urinary urgency 02/20/2019  . History of elevated PSA 02/20/2019  . History of renal cell carcinoma 02/20/2019  . Hematuria 04/21/2019   Resolved Ambulatory Problems    Diagnosis Date Noted  . Renal cell carcinoma ([place]) 05/04/2016   Past Medical History:  Diagnosis Date  . BPH (benign prostatic hyperplasia)   . Calculus of kidney 12/06/2013  . Cancer ([place])   . Chronic kidney disease   . Delirium   . Dementia ([place])   . Enlarged prostate with lower urinary tract symptoms (LUTS) 03/03/2013  . Hypertension    SOCIAL HX:  Social History   Socioeconomic History  . Marital status: Married    Spouse name: Not on file  . Number of children: Not on file  . Years of education: Not on file  . Highest education level: Not on file  Occupational History  . Not on file  Tobacco Use  . Smoking status: Current Every Day Smoker    Packs/day: 1.50    Years: 65.00    Pack years: 97.50    Types: E-cigarettes, Cigarettes  . Smokeless tobacco: Never Used  . Tobacco comment:  currently vapes  Vaping Use  . Vaping Use: Never used  Substance and Sexual Activity  . Alcohol use: No  . Drug use: No  . Sexual activity: Yes    Birth control/protection: None  Other Topics Concern  . Not on file  Social History Narrative  . Not on file   Social Determinants of Health   Financial Resource Strain: Not on file  Food Insecurity: Not on file  Transportation Needs: Not on file  Physical Activity: Not on file  Stress: Not on file  Social Connections: Not on file    FAMILY HX:  Family History  Problem Relation Age of Onset  . Alzheimer's disease Mother   . Alzheimer's disease Sister   . Bladder Cancer Neg Hx   . Prolactinoma Neg Hx   . Prostate cancer Neg Hx     ALLERGIES:  Allergies  Allergen Reactions  . Quinapril Cough  . Ambien [Zolpidem Tartrate] Other (See Comments)    Unspecified reaction  . Fenofibrate Other (See Comments)  Unknown  . Amoxicillin-Pot Clavulanate Nausea And Vomiting  . Morphine Nausea Only     PERTINENT MEDICATIONS:  Outpatient Encounter Medications as of 10/31/2020  Medication Sig  . terbinafine (LAMISIL) 1 % cream Apply 1 application topically 2 (two) times daily. Use bid to affected area, for 2-4 weeks.  Marland Kitchen acetaminophen (TYLENOL) 325 MG tablet Take 2 tablets (650 mg total) by mouth every 6 (six) hours as needed for mild pain (or Fever >/= 101).  Marland Kitchen albuterol (VENTOLIN HFA) 108 (90 Base) MCG/ACT inhaler Inhale 2 puffs into the lungs every 6 (six) hours as needed for wheezing or shortness of breath.   Marland Kitchen aspirin EC 81 MG tablet Take 81 mg by mouth daily.  . carbidopa-levodopa (SINEMET CR) 50-200 MG tablet Take 1 tablet by mouth 2 (two) times daily.  . carbidopa-levodopa (SINEMET IR) 25-100 MG tablet Take 2 tablets by mouth 3 (three) times daily.   . cetirizine (ZYRTEC) 10 MG tablet Take 10 mg by mouth daily.  Marland Kitchen diltiazem (CARDIZEM CD) 240 MG 24 hr capsule Take 240 mg by mouth daily.   Marland Kitchen donepezil (ARICEPT) 10 MG tablet Take 10  mg by mouth every evening.   . dutasteride (AVODART) 0.5 MG capsule Take 0.5 mg by mouth daily.  . Melatonin 5 MG TABS Take 15 mg by mouth at bedtime.   . midodrine (PROAMATINE) 2.5 MG tablet Take by mouth.  . polyethylene glycol (MIRALAX / GLYCOLAX) 17 g packet Take 17 g by mouth daily as needed for moderate constipation.  . QUEtiapine (SEROQUEL) 25 MG tablet Take 25 mg by mouth every morning.  . rivastigmine (EXELON) 4.6 mg/24hr 4.6 mg daily.  . traZODone (DESYREL) 100 MG tablet Take 100 mg by mouth at bedtime.   Marland Kitchen venlafaxine XR (EFFEXOR-XR) 75 MG 24 hr capsule Take 75 mg by mouth at bedtime.   . [DISCONTINUED] clonazePAM (KLONOPIN) 0.5 MG tablet Take by mouth.   No facility-administered encounter medications on file as of 10/31/2020.    Thank you for the opportunity to participate in the care of Mr. Zeman.  The palliative care team will continue to follow. Please call our office at [phone] if we can be of additional assistance.   Jason Coop, NP , DNP, MPH, AGPCNP-BC, ACHPN  COVID-19 PATIENT SCREENING TOOL Asked and negative response unless otherwise noted:   Have you had symptoms of covid, tested positive or been in contact with someone with symptoms/positive test in the past 5-10 days?

## 2020-11-08 IMAGING — XA IR EMBO ARTERIAL NOT [PERSON_NAME] INC GUIDE ROADMAPPING
12 of 22 series · 12 of 24 positions shown · IV contrast (IODINE)
Comparison: none

INDICATION: 78-year-old male with benign prostatic hyperplasia complicated by
recurrent hematuria and lower urinary tract symptoms. He presents
for elective prostate artery embolization.

[Series 2: body 4 care · 1 of 11 slices shown (1 of 12)]
[im 1/11]
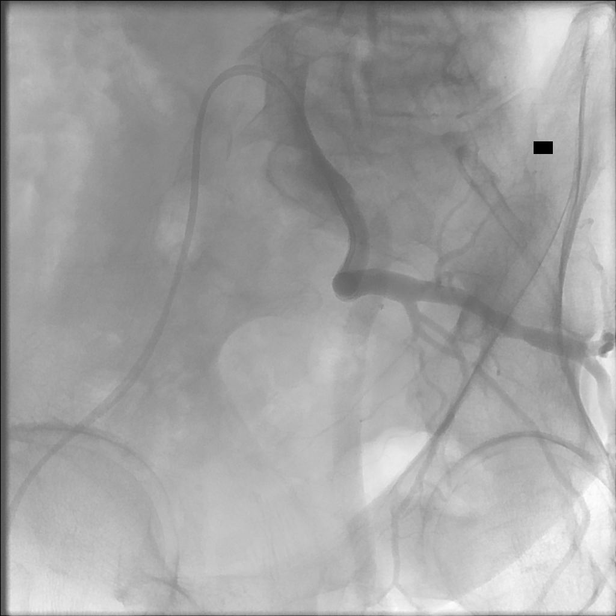

[Series 4: body 4 care · 1 of 9 slices shown (2 of 12)]
[im 1/9]
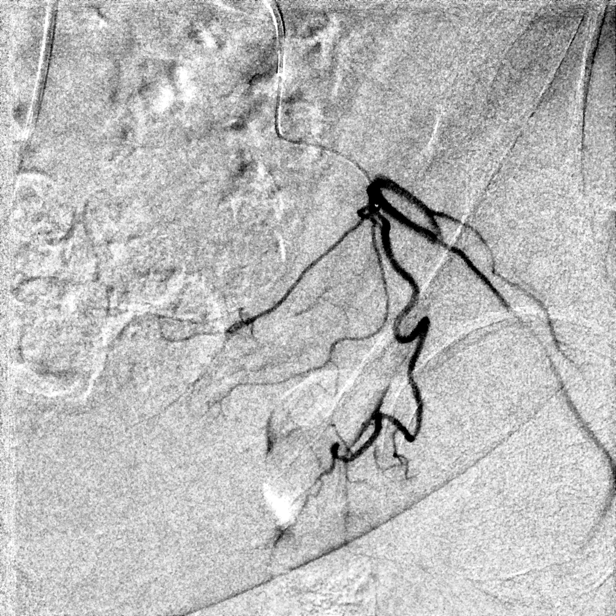

[Series 5: body 4 care · 1 of 18 slices shown (3 of 12)]
[im 18/18]
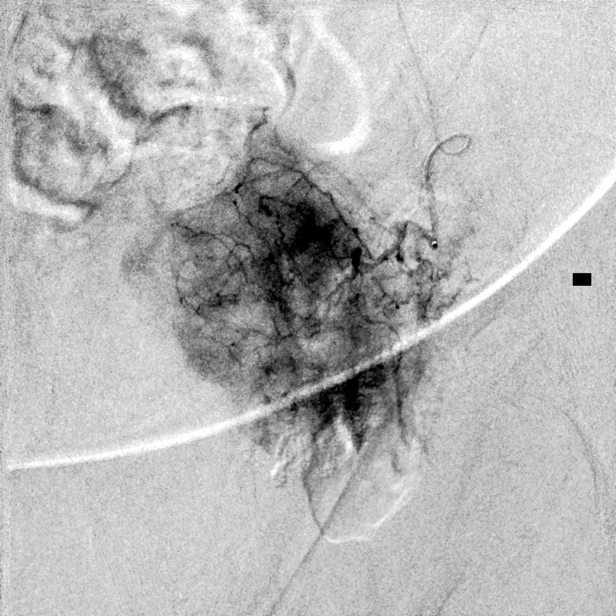

[Series 6: body 4 care · 1 of 16 slices shown (4 of 12)]
[im 16/16]
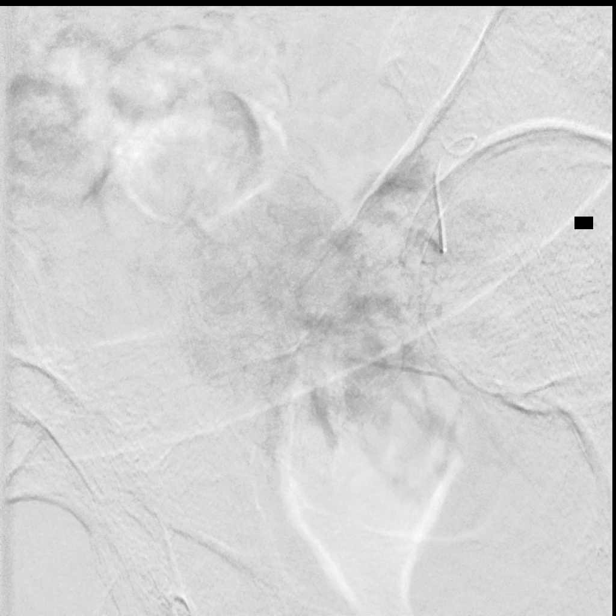

[Series 9: body 4 care · 1 of 12 slices shown (5 of 12)]
[im 1/12]
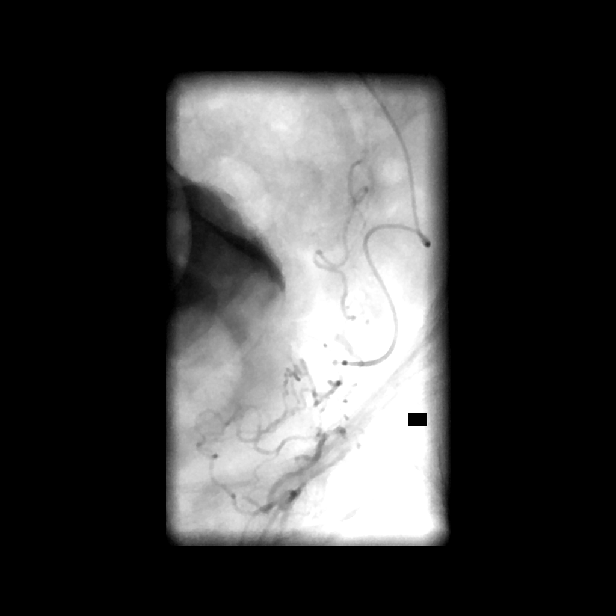

[Series 11: body 4 care · 1 of 9 slices shown (6 of 12)]
[im 1/9]
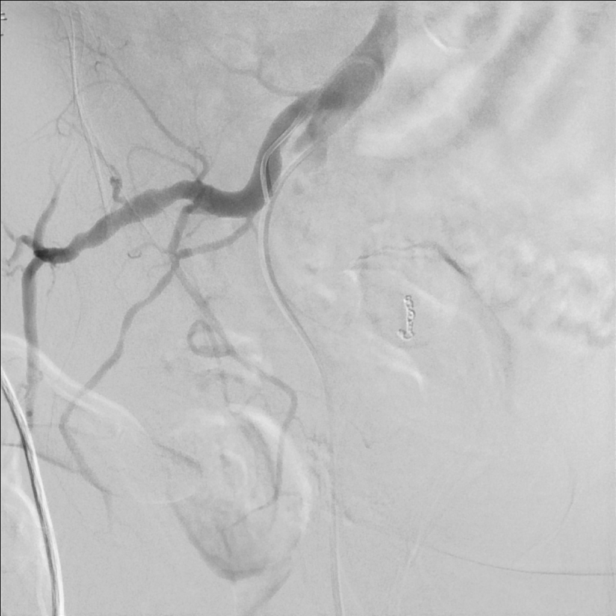

[Series 13: body 4 care · 1 of 9 slices shown (7 of 12)]
[im 1/9]
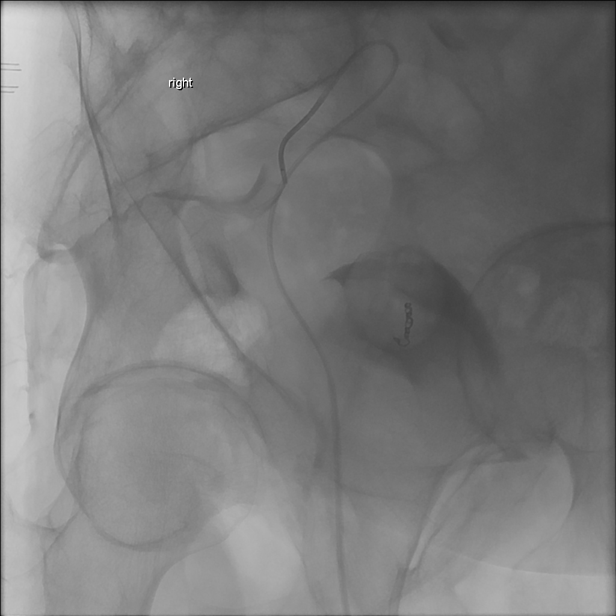

[Series 15: body 4 care · 1 of 8 slices shown (8 of 12)]
[im 1/8]
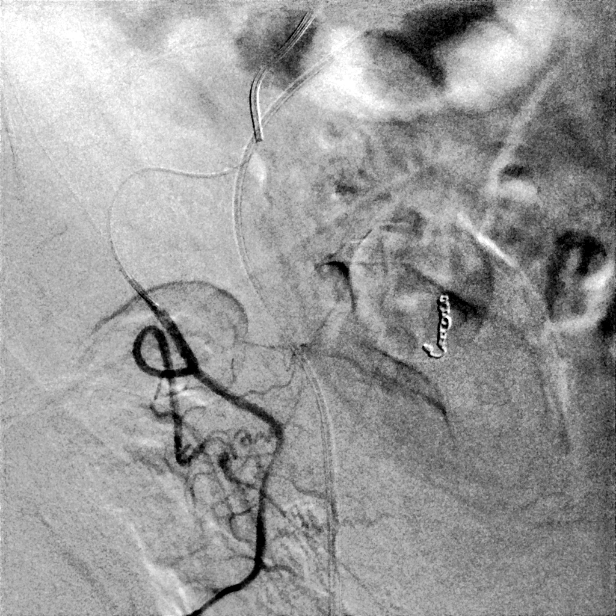

[Series 17: body 4 care · 1 of 12 slices shown (9 of 12)]
[im 1/12]
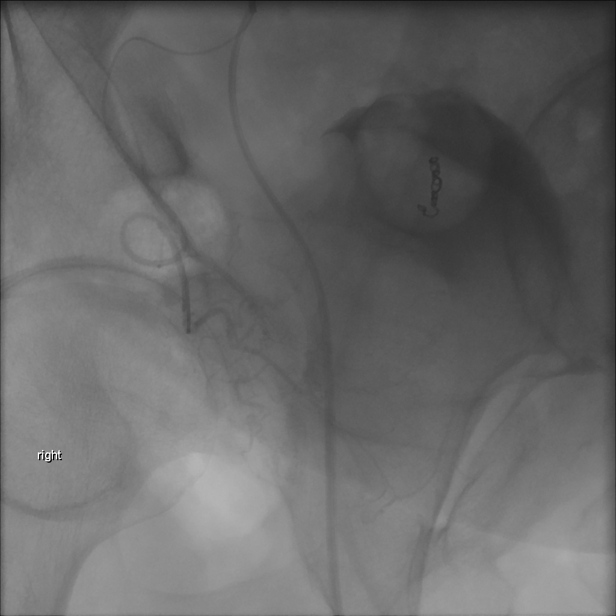

[Series 20: body 4 care · 1 of 11 slices shown (10 of 12)]
[im 1/11]
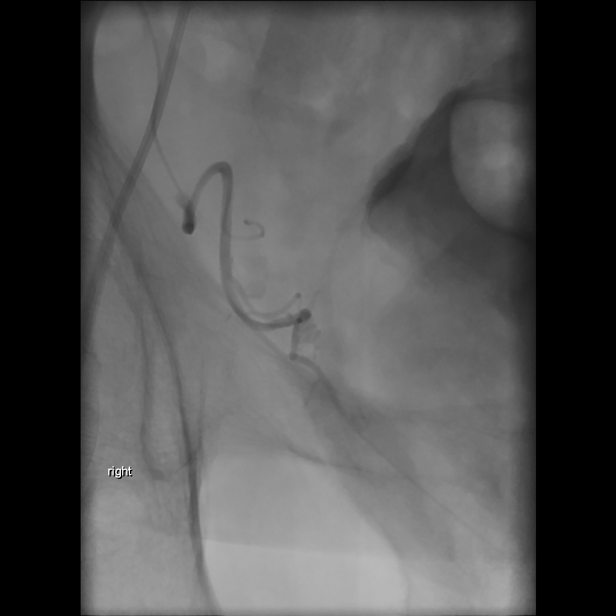

[Series 22: body 4 care · 1 of 11 slices shown (11 of 12)]
[im 1/11]
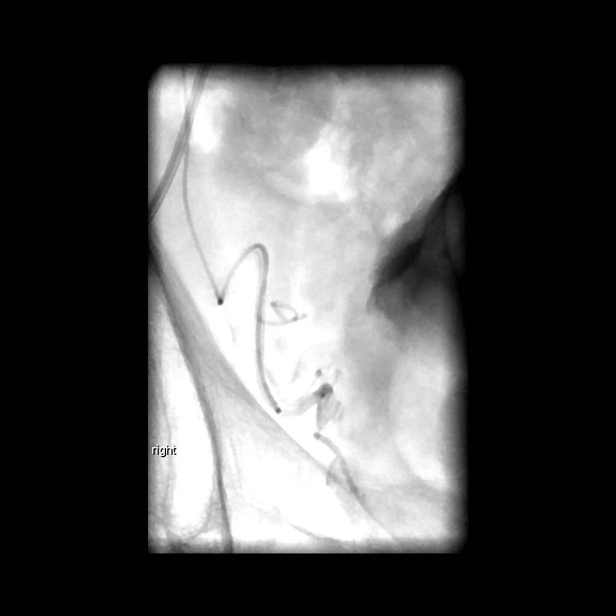

[Series 24: body 4 care · 1 of 5 slices shown (12 of 12)]
[im 1/5]
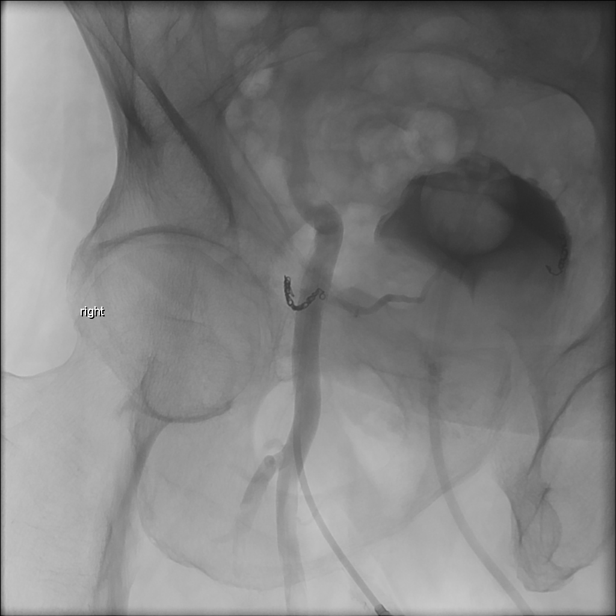

[12 of 24 positions shown; findings below may reference images not displayed]

EXAM:
IR EMBO ARTERIAL NOT HEMORR APDO INC GUIDE ROADMAPPING; ADDITIONAL
ARTERIOGRAPHY; PELVIC SELECTIVE ARTERIOGRAPHY; ARTERIOGRAPHY

1. Ultrasound-guided access right common femoral artery
2. Catheterization of the left internal iliac artery with
arteriogram
3. Catheterization the anterior division of the left internal iliac
artery with arteriogram
4. Catheterization of the left prostatic artery with arteriogram
5. Particle embolization of the left prostatic artery
6. Catheterization the right internal iliac artery with arteriogram
7. Catheterization of the anterior division of the right internal
iliac artery with arteriogram
8. Catheterization of the right obturator artery with arteriogram
9. Catheterization of the right prostatic artery with arteriogram
10. Particle embolization of the right prostatic artery

MEDICATIONS:
400 mg Cipro, 500 mcg nitroglycerin. The antibiotic was administered
within 1 hour of the procedure

ANESTHESIA/SEDATION:
Moderate (conscious) sedation was employed during this procedure. A
total of Versed 3 mg and Fentanyl 125 mcg was administered
intravenously.

Moderate Sedation Time: 107 minutes. The patient's level of
consciousness and vital signs were monitored continuously by
radiology nursing throughout the procedure under my direct
supervision.

CONTRAST:  75 mL Jsovue-ALL

FLUOROSCOPY TIME:  Fluoroscopy Time: 25 minutes 18 seconds (2289
mGy).

COMPLICATIONS:
None immediate.



The right common femoral artery was interrogated with ultrasound and
found to be widely patent. An image was obtained and stored for the
medical record. Local anesthesia was attained by infiltration with
1% lidocaine. A small dermatotomy was made. Under real-time
sonographic guidance, the vessel was punctured with a 21 gauge
micropuncture needle. Using standard technique, the initial micro
needle was exchanged over a 0.018 micro wire for a transitional 4
French micro sheath. The micro sheath was then exchanged over a
0.035 wire for a 5 French vascular sheath.

A C2 cobra catheter was advanced up in over the aortic bifurcation
and used to select the left internal iliac artery. Arteriography was
then performed in multiple obliquities. The anterior division of the
left internal iliac artery is relatively short and provides a common
origin for the obturator, internal pudendal, umbilical, superior
vesicular and prosthetic arteries.

A Progreat outflow microcatheter was successfully navigated into the
proximal trunk of the anterior division. Additional arteriography
was performed under greater magnification and in different
obliquities in order to isolate the origin of the left prosthetic
artery.

The microcatheter was then successfully navigated into the
prosthetic artery utilizing a Fathom 16 wire. Arteriography was
performed demonstrating an excellent parenchymal blush within the
prostate gland. Cone beam CT angiography was then performed
confirming excellent perfusion of the left hemi prostate gland.
There was no evidence of non target perfusion.

Therefore, particle embolization was then performed to near stasis
utilizing 200 micron Terumo Hydrapearls. Just prior to particle
injection, 200 mcg of nitroglycerin was administered. Once near
stasis was achieved, the distal prostatic artery was coil embolized
using 0.018 Terumo Rio microcoils. A 2 x 2, and 2 x 4 detachable
micro coil were placed.

The microcatheter was then removed. The C2 cobra catheter was then
formed into Elina [place] loop and used to select the ipsilateral right
internal iliac artery. Arteriography was performed. Anatomy is
different on the right-side versus the left. The anterior division
consists of a common origin of the umbilical artery and internal
pudendal artery. The obturator artery arises proximally from the
posterior division and appears to give rise to the prosthetic
artery.

The Progreat outflow microcatheter was successfully advanced into
the obturator artery. Arteriography was performed confirming that
the prosthetic artery and superior vesicular artery arise from the
mid aspect of the obturator artery. The microcatheter was
successfully advanced into the right prostatic artery. Arteriography
was performed confirming excellent tumor blush. Cone beam CT
angiography was then performed further confirming perfusion of the
right hemi prostate gland without evidence of non target perfusion.
Nitroglycerin was administered into the distal prosthetic artery.
Particle embolization was then performed to near stasis again using
200 micron spheres. The distal prosthetic artery was then coil
embolized using a combination of 2 and 3 mm detachable microcoils.

The catheters were removed. A limited right common femoral
arteriogram confirms common femoral arterial access. Hemostasis was
attained with the assistance of an Angio-Seal extra arterial
vascular plug.
IMPRESSION: 1. Successful bilateral prostate artery embolization.

## 2020-11-16 DIAGNOSIS — M26602 Left temporomandibular joint disorder, unspecified: Secondary | ICD-10-CM | POA: Diagnosis not present

## 2020-12-12 ENCOUNTER — Other Ambulatory Visit: Payer: PPO | Admitting: Primary Care

## 2020-12-12 ENCOUNTER — Other Ambulatory Visit: Payer: Self-pay

## 2020-12-12 DIAGNOSIS — M5136 Other intervertebral disc degeneration, lumbar region: Secondary | ICD-10-CM

## 2020-12-12 DIAGNOSIS — M51369 Other intervertebral disc degeneration, lumbar region without mention of lumbar back pain or lower extremity pain: Secondary | ICD-10-CM

## 2020-12-12 DIAGNOSIS — R259 Unspecified abnormal involuntary movements: Secondary | ICD-10-CM

## 2020-12-12 DIAGNOSIS — Z515 Encounter for palliative care: Secondary | ICD-10-CM | POA: Diagnosis not present

## 2020-12-12 DIAGNOSIS — R29818 Other symptoms and signs involving the nervous system: Secondary | ICD-10-CM

## 2020-12-12 NOTE — Progress Notes (Signed)
[facility] Consult Note Telephone: [phone]  Fax: [phone]    Date of encounter: 12/12/20 PATIENT NAME: Russell Terry [address] [place] [address]   [phone] (home)  DOB: [birth year] MRN: [id number] PRIMARY CARE PROVIDER:    Maryland Pink, MD,  [facility] [place] [address] [phone]  REFERRING PROVIDER:   Maryland Pink, MD [address] [place],  [place] [address] [phone]  RESPONSIBLE PARTY:    Contact Information    Name Relation Home Work Mobile   [place] Spouse [phone]  [phone]   Russell Terry Sister   [phone]   Russell Terry Daughter   [phone]       I met face to face with patient and family in  home. Palliative Care was asked to follow this patient by consultation request of  Russell Pink, MD to address advance care planning and complex medical decision making. This is a follow up visit.                                   ASSESSMENT AND PLAN / RECOMMENDATIONS:   Advance Care Planning/Goals of Care: Goals include to maximize quality of life and symptom management. Our advance care planning conversation included a discussion about:     The value and importance of advance care planning   Experiences with loved ones who have been seriously ill or have died   Exploration of personal, cultural or spiritual beliefs that might influence medical decisions   Exploration of goals of care in the event of a sudden injury or illness   Review  of an  advance directive document. No changes.  Wife outlines extreme caregiver strain. Discussed sources of some helping services. She will call lawyer to discuss facility funding, and also see if [facility] may be appropriate.   Symptom Management/Plan:  Med management. Not taking medications consistently. Patient often gets agitated. Reviewed and will d/c some he's not taking,  combining some in higher dose formats. I recommend d/c of aricept. Wife Will discuss with neurology.  Caregiver strain:  Patient having more care needs.  Wife has left with some caregivers but they are not able to care for him. He has gone out of the door and fell on the back steps, sustaining large skin tear. Discussed calling elder laywer for financing work, [facility] and fee for service help.   Pain: Appears to have some knee pain. Sometimes can get up (I) and bends over and clutches knees on ambulation. Recommend OTC analgesia PO and topical.  Parkinson's: Wife endorses dx of lewy bodies. Reports bradykinesia, not well managed on current sinemet protocol. Has had some hallucinations, has seen snakes in yard. Has had elopement. Has gotten alarms on the door. Discussed disease course and other supportive services.   Follow up Palliative Care Visit: Palliative care will continue to follow for complex medical decision making, advance care planning, and clarification of goals. Return 4 weeks or prn.  I spent 60 minutes providing this consultation. More than 50% of the time in this consultation was spent in counseling and care coordination.  PPS: 40%  HOSPICE ELIGIBILITY/DIAGNOSIS: TBD  Chief Complaint: parkinsons  HISTORY OF PRESENT ILLNESS:  Russell Terry is a 80 y.o. year old male  with PD with Lewy Bodies, immobility, falls .   History obtained from review of EMR, discussion with  primary team, and interview with family, facility staff/caregiver and/or Russell Terry.  I reviewed available labs, medications, imaging, studies and related documents from the EMR.  Records reviewed and summarized above.   ROS/wife  General: NAD ENMT: endorses dysphagia Pulmonary: denies cough, denies increased SOB Abdomen: endorses good  Altho declining some appetite, denies constipation, endorses incontinence of bowel GU: denies dysuria, endorses incontinence of urine MSK:  Endorses  weakness,  Several   falls reported Skin: denies rashes or wounds Neurological: endorses pain, denies insomnia Psych: Endorses flat mood Heme/lymph/immuno: denies bruises, abnormal bleeding  Physical Exam:  deferred  Thank you for the opportunity to participate in the care of Russell Terry.  The palliative care team will continue to follow. Please call our office at [phone] if we can be of additional assistance.   Jason Coop, NP , DNP, MPH, AGPCNP-BC, ACHPN  COVID-19 PATIENT SCREENING TOOL Asked and negative response unless otherwise noted:   Have you had symptoms of covid, tested positive or been in contact with someone with symptoms/positive test in the past 5-10 days?

## 2020-12-19 DIAGNOSIS — F0280 Dementia in other diseases classified elsewhere without behavioral disturbance: Secondary | ICD-10-CM | POA: Diagnosis not present

## 2020-12-19 DIAGNOSIS — G3183 Dementia with Lewy bodies: Secondary | ICD-10-CM | POA: Diagnosis not present

## 2020-12-19 DIAGNOSIS — G4700 Insomnia, unspecified: Secondary | ICD-10-CM | POA: Diagnosis not present

## 2020-12-26 DIAGNOSIS — Z125 Encounter for screening for malignant neoplasm of prostate: Secondary | ICD-10-CM | POA: Diagnosis not present

## 2020-12-26 DIAGNOSIS — G3183 Dementia with Lewy bodies: Secondary | ICD-10-CM | POA: Diagnosis not present

## 2020-12-26 DIAGNOSIS — G20 Parkinson's disease: Secondary | ICD-10-CM | POA: Diagnosis not present

## 2020-12-26 DIAGNOSIS — N390 Urinary tract infection, site not specified: Secondary | ICD-10-CM | POA: Diagnosis not present

## 2020-12-26 DIAGNOSIS — F339 Major depressive disorder, recurrent, unspecified: Secondary | ICD-10-CM | POA: Diagnosis not present

## 2020-12-26 DIAGNOSIS — E538 Deficiency of other specified B group vitamins: Secondary | ICD-10-CM | POA: Diagnosis not present

## 2020-12-26 DIAGNOSIS — I10 Essential (primary) hypertension: Secondary | ICD-10-CM | POA: Diagnosis not present

## 2020-12-26 DIAGNOSIS — E785 Hyperlipidemia, unspecified: Secondary | ICD-10-CM | POA: Diagnosis not present

## 2020-12-26 DIAGNOSIS — F0280 Dementia in other diseases classified elsewhere without behavioral disturbance: Secondary | ICD-10-CM | POA: Diagnosis not present

## 2020-12-26 DIAGNOSIS — I480 Paroxysmal atrial fibrillation: Secondary | ICD-10-CM | POA: Diagnosis not present

## 2020-12-26 DIAGNOSIS — N1831 Chronic kidney disease, stage 3a: Secondary | ICD-10-CM | POA: Diagnosis not present

## 2021-01-05 DIAGNOSIS — N390 Urinary tract infection, site not specified: Secondary | ICD-10-CM | POA: Diagnosis not present

## 2021-01-09 ENCOUNTER — Other Ambulatory Visit: Payer: Self-pay

## 2021-01-09 ENCOUNTER — Other Ambulatory Visit: Payer: PPO | Admitting: Primary Care

## 2021-01-09 DIAGNOSIS — R259 Unspecified abnormal involuntary movements: Secondary | ICD-10-CM | POA: Diagnosis not present

## 2021-01-09 DIAGNOSIS — G309 Alzheimer's disease, unspecified: Secondary | ICD-10-CM

## 2021-01-09 DIAGNOSIS — M5136 Other intervertebral disc degeneration, lumbar region: Secondary | ICD-10-CM | POA: Diagnosis not present

## 2021-01-09 DIAGNOSIS — Z515 Encounter for palliative care: Secondary | ICD-10-CM | POA: Diagnosis not present

## 2021-01-09 DIAGNOSIS — F0280 Dementia in other diseases classified elsewhere without behavioral disturbance: Secondary | ICD-10-CM

## 2021-01-09 DIAGNOSIS — R29818 Other symptoms and signs involving the nervous system: Secondary | ICD-10-CM

## 2021-01-09 DIAGNOSIS — M51369 Other intervertebral disc degeneration, lumbar region without mention of lumbar back pain or lower extremity pain: Secondary | ICD-10-CM

## 2021-01-09 NOTE — Progress Notes (Signed)
[facility] Consult Note Telephone: [phone]  Fax: [phone]    Date of encounter: 01/09/21 PATIENT NAME: Russell Terry [address] [place] [address]   [phone] (home)  DOB: [birth year] MRN: [id number] PRIMARY CARE PROVIDER:    Maryland Pink, MD,  [place] [address] [phone]  REFERRING PROVIDER:   Maryland Pink, MD [address] [place],  [place] [address] [phone]  RESPONSIBLE PARTY:    Contact Information     Name Relation Home Work Mobile   [place] Spouse [phone]  [phone]   [place] Sister   [phone]   [place] Daughter   [phone]        I met face to face with patient and family in  home. Palliative Care was asked to follow this patient by consultation request of  Maryland Pink, MD to address advance care planning and complex medical decision making. This is a follow up visit.                                   ASSESSMENT AND PLAN / RECOMMENDATIONS:   Advance Care Planning/Goals of Care: Goals include to maximize quality of life and symptom management. Our advance care planning conversation included a discussion about:    The value and importance of advance care planning  Experiences with loved ones who have been seriously ill or have died  Exploration of personal, cultural or spiritual beliefs that might influence medical decisions  Exploration of goals of care in the event of a sudden injury or illness  Caregiver strain and planning for ongoing care giving.  CODE STATUS: DNR, limited scope  Symptom Management/Plan:  Continued to discuss caregiver strain and needs. Wife has called [facility] but he was over the income limit. She is not able to pay for in home care for a substantial amount of hours due to cost. She has dental issues and feels she needs support to get care. We discussed some  options for her to provide herself self-care.  We discussed her needing to still go to have finances reviewed for paying for facility care should pt need this. We also discussed hospice support, although he appears to have a > 6 month prognosis. He has had a decrease in function which if it continues, could indicate < 6 months. His intake is still good. We discussed these type benchmarks for establishing  in home hospice eligibility.  We looked at pt care area, and I suggested she try to give him a bath in bed in the AM, while his is waiting for his first dose of sinemet to help his mobility. He no longer wants to get into a shower, and she is pan bathing him. I showed her some techniques for bathing a bed bound patient which may make care giving easier for her. She may also want to consider a fully electric bed ( ie with copay) to have the option of easier adjustment of bed height. She voiced wanting to see how the next several months go before making any decisions about hospice or facility.  Follow up Palliative Care Visit: Palliative care will continue to follow for complex medical decision making, advance care planning, and clarification of goals. Return 8 weeks or prn.  I spent 60 minutes providing this consultation. More than 50% of the time in this consultation  was spent in counseling and care coordination.  PPS: 40%  HOSPICE ELIGIBILITY/DIAGNOSIS: TBD  Chief Complaint: immobility d/t PD  HISTORY OF PRESENT ILLNESS:  Russell Terry is a 80 y.o. year old male  with advancing PD symptoms, dementia, immobility. .   History obtained from review of EMR, discussion with primary team, and interview with family, facility staff/caregiver and/or Russell Terry.  I reviewed available labs, medications, imaging, studies and related documents from the EMR.  Records reviewed and summarized above.   ROS/family    General: NAD ENMT: denies dysphagia Cardiovascular: denies chest pain, denies  DOE Pulmonary: denies cough, denies increased SOB Abdomen: endorses good appetite, denies constipation, endorses occ incontinence of bowel GU: denies dysuria, endorses incontinence of urine MSK:   endorses  weakness,  no  recent falls reported Skin: denies rashes or wounds Neurological: denies pain, endorses increasing insomnia Psych: Endorses positive mood Heme/lymph/immuno: denies bruises, abnormal bleeding  Physical Exam: Current and past weights: 175 lbs, 10 lb loss over 6-8 mos (5%) Constitutional: NAD General: frail appearing, WNWD EYES: anicteric sclera, lids intact, no discharge  ENMT: intact hearing, oral mucous membranes moist, dentition intact Pulmonary:no increased work of breathing, no cough, room air, uses vape device Abdomen: intake 100%,  no ascites GU: deferred MSK: mod sarcopenia, moves all extremities, ambulatory with walker and cuing Skin: warm and dry, no rashes or wounds on visible skin Neuro:  ++ generalized weakness,  ++ cognitive impairment Psych: non-anxious affect, A and O x 1 Hem/lymph/immuno: no widespread bruising   Thank you for the opportunity to participate in the care of Russell Terry.  The palliative care team will continue to follow. Please call our office at [phone] if we can be of additional assistance.   Jason Coop, NP , DNP, MPH, AGPCNP-BC, ACHPN  COVID-19 PATIENT SCREENING TOOL Asked and negative response unless otherwise noted:   Have you had symptoms of covid, tested positive or been in contact with someone with symptoms/positive test in the past 5-10 days?

## 2021-01-25 DIAGNOSIS — R410 Disorientation, unspecified: Secondary | ICD-10-CM | POA: Diagnosis not present

## 2021-01-26 DIAGNOSIS — R300 Dysuria: Secondary | ICD-10-CM | POA: Diagnosis not present

## 2021-01-29 DIAGNOSIS — G4700 Insomnia, unspecified: Secondary | ICD-10-CM | POA: Diagnosis not present

## 2021-01-29 DIAGNOSIS — G3183 Dementia with Lewy bodies: Secondary | ICD-10-CM | POA: Diagnosis not present

## 2021-01-29 DIAGNOSIS — F0280 Dementia in other diseases classified elsewhere without behavioral disturbance: Secondary | ICD-10-CM | POA: Diagnosis not present

## 2021-02-02 ENCOUNTER — Telehealth: Payer: Self-pay

## 2021-02-02 NOTE — Telephone Encounter (Signed)
Received message to call patient's wife, [name]. Returned call to patient's wife who shared that patient is having difficulty swallowing meds. Patient will hold pills in his mouth or chew up medications. [name] shared that she spoke with Gayland Curry PA today. Patient has 2 more doses of antibiotics for UTI. Would like to speak with Palliative NP but not urgent in nature.  Seroquel was increased today by Gayland Curry PA.  Katie NP updated.

## 2021-02-05 DIAGNOSIS — F0391 Unspecified dementia with behavioral disturbance: Secondary | ICD-10-CM | POA: Diagnosis not present

## 2021-02-06 ENCOUNTER — Telehealth: Payer: Self-pay | Admitting: Primary Care

## 2021-02-06 ENCOUNTER — Telehealth: Payer: Self-pay | Admitting: Family Medicine

## 2021-02-06 DIAGNOSIS — N390 Urinary tract infection, site not specified: Secondary | ICD-10-CM | POA: Diagnosis not present

## 2021-02-06 NOTE — Telephone Encounter (Signed)
Discussion with wife RE extreme care giver strain and burden. She outlines financial limitations and having gone to attorney to assess for Medicaid assessment. They feel he can get a bed in a SNF. He has an FL2 and she has approached [facility]. We discussed a suggestion for home health for aides. He has stopped taking meds some days. She is speaking with neurology and me RE decision making.  I have referred to our SW to follow up.

## 2021-02-06 NOTE — Telephone Encounter (Signed)
Russell Terry from [facility] called and wanted a return call. I tried to call her 2 times and no voice mail is set up so unable to leave a message.

## 2021-02-07 NOTE — Telephone Encounter (Signed)
Patient's wife called the office requesting our help.  She states that [facility] has been coming out to the house to treat patient's UTI.  They have had him on multiple antibiotics and does not seem to be clearing up.   Olivia Mackie, [place] Nurse ([facility]) [phone]  Stanton Kidney (alternate contact at [facility]) [phone]  Dr. Kary Kos, PCP, has also treated him for UTI back in June.    Patient is still having symptoms - confusion, lower back pain and foul smelling urine.  Patient's wife can be reached at [phone].

## 2021-02-07 NOTE — Telephone Encounter (Signed)
Spoke to [place] with [facility].  She is going to fax over visit notes and lab results for our review.  They are also requesting our advice on how to proceed with this patient.

## 2021-02-08 ENCOUNTER — Telehealth: Payer: Self-pay

## 2021-02-08 NOTE — Telephone Encounter (Signed)
02/08/21 @ 1130 AM: Palliative care SW outreached patients spouse, Hilda Blades, per [facility] NP - K. Smith request, to assess and address concerns around placement and applying for Medicaid.  Spouse shares that she has a more clear guide/scope what she is doing now. She admits she has been a bit overwhelmed with info.  She is going to continue working with lawyer to assist her with the Medicaid Vs completing on her own and submitting to DSS, mainly due to financial caveats. I told her I think that is best. I provided her with [employer] contact info so she can also receive another opinion or input (for free, as their initial consultations are free of charge). We also discussed other SNF's [place] that she can consider for placement. She was appreciative of the call and info. Didn't have any other concerns or pressing needs of me, but knows she can outreach me if needed.  She's [employer] and provided them a copy  of the FL2 as of yesterday they are reviewing it. She's going to outreach [employer] and [facility] and continue working with a Chief Executive Officer on the [employer].

## 2021-02-14 ENCOUNTER — Other Ambulatory Visit: Payer: Self-pay

## 2021-02-14 ENCOUNTER — Emergency Department: Payer: PPO

## 2021-02-14 ENCOUNTER — Inpatient Hospital Stay
Admission: EM | Admit: 2021-02-14 | Discharge: 2021-02-20 | DRG: 640 | Disposition: A | Discharge status: Hospice | Payer: PPO | Attending: Hospitalist | Admitting: Hospitalist

## 2021-02-14 DIAGNOSIS — G20 Parkinson's disease: Secondary | ICD-10-CM | POA: Diagnosis not present

## 2021-02-14 DIAGNOSIS — Z888 Allergy status to other drugs, medicaments and biological substances status: Secondary | ICD-10-CM | POA: Diagnosis not present

## 2021-02-14 DIAGNOSIS — J449 Chronic obstructive pulmonary disease, unspecified: Secondary | ICD-10-CM | POA: Diagnosis not present

## 2021-02-14 DIAGNOSIS — E785 Hyperlipidemia, unspecified: Secondary | ICD-10-CM | POA: Diagnosis present

## 2021-02-14 DIAGNOSIS — Z881 Allergy status to other antibiotic agents status: Secondary | ICD-10-CM | POA: Diagnosis not present

## 2021-02-14 DIAGNOSIS — Z7401 Bed confinement status: Secondary | ICD-10-CM | POA: Diagnosis not present

## 2021-02-14 DIAGNOSIS — R1310 Dysphagia, unspecified: Secondary | ICD-10-CM | POA: Diagnosis present

## 2021-02-14 DIAGNOSIS — Z7982 Long term (current) use of aspirin: Secondary | ICD-10-CM

## 2021-02-14 DIAGNOSIS — N401 Enlarged prostate with lower urinary tract symptoms: Secondary | ICD-10-CM | POA: Diagnosis present

## 2021-02-14 DIAGNOSIS — Z20822 Contact with and (suspected) exposure to covid-19: Secondary | ICD-10-CM | POA: Diagnosis present

## 2021-02-14 DIAGNOSIS — R4182 Altered mental status, unspecified: Secondary | ICD-10-CM

## 2021-02-14 DIAGNOSIS — G9341 Metabolic encephalopathy: Secondary | ICD-10-CM | POA: Diagnosis not present

## 2021-02-14 DIAGNOSIS — N200 Calculus of kidney: Secondary | ICD-10-CM | POA: Diagnosis present

## 2021-02-14 DIAGNOSIS — Z86011 Personal history of benign neoplasm of the brain: Secondary | ICD-10-CM

## 2021-02-14 DIAGNOSIS — N390 Urinary tract infection, site not specified: Secondary | ICD-10-CM

## 2021-02-14 DIAGNOSIS — Z515 Encounter for palliative care: Secondary | ICD-10-CM | POA: Diagnosis not present

## 2021-02-14 DIAGNOSIS — I129 Hypertensive chronic kidney disease with stage 1 through stage 4 chronic kidney disease, or unspecified chronic kidney disease: Secondary | ICD-10-CM | POA: Diagnosis not present

## 2021-02-14 DIAGNOSIS — T4275XA Adverse effect of unspecified antiepileptic and sedative-hypnotic drugs, initial encounter: Secondary | ICD-10-CM | POA: Diagnosis not present

## 2021-02-14 DIAGNOSIS — I4891 Unspecified atrial fibrillation: Secondary | ICD-10-CM | POA: Diagnosis not present

## 2021-02-14 DIAGNOSIS — F0280 Dementia in other diseases classified elsewhere without behavioral disturbance: Secondary | ICD-10-CM | POA: Diagnosis present

## 2021-02-14 DIAGNOSIS — Z7189 Other specified counseling: Secondary | ICD-10-CM | POA: Diagnosis not present

## 2021-02-14 DIAGNOSIS — E860 Dehydration: Principal | ICD-10-CM | POA: Diagnosis present

## 2021-02-14 DIAGNOSIS — R0689 Other abnormalities of breathing: Secondary | ICD-10-CM | POA: Diagnosis not present

## 2021-02-14 DIAGNOSIS — Z905 Acquired absence of kidney: Secondary | ICD-10-CM

## 2021-02-14 DIAGNOSIS — R0902 Hypoxemia: Secondary | ICD-10-CM | POA: Diagnosis not present

## 2021-02-14 DIAGNOSIS — Z66 Do not resuscitate: Secondary | ICD-10-CM | POA: Diagnosis not present

## 2021-02-14 DIAGNOSIS — N1831 Chronic kidney disease, stage 3a: Secondary | ICD-10-CM | POA: Diagnosis present

## 2021-02-14 DIAGNOSIS — Z820 Family history of epilepsy and other diseases of the nervous system: Secondary | ICD-10-CM | POA: Diagnosis not present

## 2021-02-14 DIAGNOSIS — R42 Dizziness and giddiness: Secondary | ICD-10-CM | POA: Diagnosis not present

## 2021-02-14 DIAGNOSIS — R52 Pain, unspecified: Secondary | ICD-10-CM | POA: Diagnosis not present

## 2021-02-14 DIAGNOSIS — R404 Transient alteration of awareness: Secondary | ICD-10-CM | POA: Diagnosis not present

## 2021-02-14 DIAGNOSIS — R55 Syncope and collapse: Secondary | ICD-10-CM | POA: Diagnosis not present

## 2021-02-14 DIAGNOSIS — Z85528 Personal history of other malignant neoplasm of kidney: Secondary | ICD-10-CM | POA: Diagnosis not present

## 2021-02-14 DIAGNOSIS — G309 Alzheimer's disease, unspecified: Secondary | ICD-10-CM | POA: Diagnosis present

## 2021-02-14 DIAGNOSIS — K5900 Constipation, unspecified: Secondary | ICD-10-CM | POA: Diagnosis not present

## 2021-02-14 DIAGNOSIS — Z885 Allergy status to narcotic agent status: Secondary | ICD-10-CM | POA: Diagnosis not present

## 2021-02-14 DIAGNOSIS — F0391 Unspecified dementia with behavioral disturbance: Secondary | ICD-10-CM | POA: Diagnosis not present

## 2021-02-14 DIAGNOSIS — Z79899 Other long term (current) drug therapy: Secondary | ICD-10-CM | POA: Diagnosis not present

## 2021-02-14 DIAGNOSIS — I10 Essential (primary) hypertension: Secondary | ICD-10-CM | POA: Diagnosis present

## 2021-02-14 DIAGNOSIS — R410 Disorientation, unspecified: Secondary | ICD-10-CM | POA: Diagnosis not present

## 2021-02-14 DIAGNOSIS — Z87440 Personal history of urinary (tract) infections: Secondary | ICD-10-CM

## 2021-02-14 LAB — CBC WITH DIFFERENTIAL/PLATELET
Abs Immature Granulocytes: 0.02 10*3/uL (ref 0.00–0.07)
Basophils Absolute: 0.1 10*3/uL (ref 0.0–0.1)
Basophils Relative: 1 %
Eosinophils Absolute: 0.4 10*3/uL (ref 0.0–0.5)
Eosinophils Relative: 6 %
HCT: 44.5 % (ref 39.0–52.0)
Hemoglobin: 15.4 g/dL (ref 13.0–17.0)
Immature Granulocytes: 0 %
Lymphocytes Relative: 26 %
Lymphs Abs: 1.6 10*3/uL (ref 0.7–4.0)
MCH: 32.6 pg (ref 26.0–34.0)
MCHC: 34.6 g/dL (ref 30.0–36.0)
MCV: 94.1 fL (ref 80.0–100.0)
Monocytes Absolute: 0.6 10*3/uL (ref 0.1–1.0)
Monocytes Relative: 9 %
Neutro Abs: 3.5 10*3/uL (ref 1.7–7.7)
Neutrophils Relative %: 58 %
Platelets: 191 10*3/uL (ref 150–400)
RBC: 4.73 MIL/uL (ref 4.22–5.81)
RDW: 13.8 % (ref 11.5–15.5)
WBC: 6.1 10*3/uL (ref 4.0–10.5)
nRBC: 0 % (ref 0.0–0.2)

## 2021-02-14 LAB — URINALYSIS, COMPLETE (UACMP) WITH MICROSCOPIC
Bilirubin Urine: NEGATIVE
Glucose, UA: NEGATIVE mg/dL
Hgb urine dipstick: NEGATIVE
Ketones, ur: 5 mg/dL — AB
Nitrite: NEGATIVE
Protein, ur: 30 mg/dL — AB
Specific Gravity, Urine: 1.023 (ref 1.005–1.030)
pH: 5 (ref 5.0–8.0)

## 2021-02-14 LAB — COMPREHENSIVE METABOLIC PANEL
ALT: 11 U/L (ref 0–44)
AST: 20 U/L (ref 15–41)
Albumin: 4.3 g/dL (ref 3.5–5.0)
Alkaline Phosphatase: 59 U/L (ref 38–126)
Anion gap: 7 (ref 5–15)
BUN: 22 mg/dL (ref 8–23)
CO2: 26 mmol/L (ref 22–32)
Calcium: 9 mg/dL (ref 8.9–10.3)
Chloride: 102 mmol/L (ref 98–111)
Creatinine, Ser: 1.62 mg/dL — ABNORMAL HIGH (ref 0.61–1.24)
GFR, Estimated: 43 mL/min — ABNORMAL LOW (ref >60)
Glucose, Bld: 104 mg/dL — ABNORMAL HIGH (ref 70–99)
Potassium: 4.4 mmol/L (ref 3.5–5.1)
Sodium: 135 mmol/L (ref 135–145)
Total Bilirubin: 1.3 mg/dL — ABNORMAL HIGH (ref 0.3–1.2)
Total Protein: 7.1 g/dL (ref 6.5–8.1)

## 2021-02-14 LAB — BRAIN NATRIURETIC PEPTIDE: B Natriuretic Peptide: 145.3 pg/mL — ABNORMAL HIGH (ref 0.0–100.0)

## 2021-02-14 LAB — TROPONIN I (HIGH SENSITIVITY)
Troponin I (High Sensitivity): 17 ng/L (ref <18)
Troponin I (High Sensitivity): 16 ng/L (ref <18)

## 2021-02-14 MED ORDER — DILTIAZEM HCL ER COATED BEADS 240 MG PO CP24
240.0000 mg | ORAL_CAPSULE | Freq: Every day | ORAL | Status: DC
  Administered 2021-02-15: 240 mg via ORAL
  Filled 2021-02-14: qty 1

## 2021-02-14 MED ORDER — TRAZODONE HCL 50 MG PO TABS: 200.0000 mg | ORAL_TABLET | Freq: Every day | ORAL | Status: DC

## 2021-02-14 MED ORDER — DUTASTERIDE 0.5 MG PO CAPS
0.5000 mg | ORAL_CAPSULE | Freq: Every day | ORAL | Status: DC
  Administered 2021-02-15 – 2021-02-16 (×2): 0.5 mg via ORAL
  Filled 2021-02-14 (×2): qty 1

## 2021-02-14 MED ORDER — ALBUTEROL SULFATE (2.5 MG/3ML) 0.083% IN NEBU: 2.5000 mg | INHALATION_SOLUTION | Freq: Four times a day (QID) | RESPIRATORY_TRACT | Status: DC | PRN

## 2021-02-14 MED ORDER — DONEPEZIL HCL 5 MG PO TABS
10.0000 mg | ORAL_TABLET | Freq: Every day | ORAL | Status: DC
  Administered 2021-02-15 – 2021-02-19 (×4): 10 mg via ORAL
  Filled 2021-02-14 (×6): qty 2

## 2021-02-14 MED ORDER — ENOXAPARIN SODIUM 40 MG/0.4ML IJ SOSY
40.0000 mg | PREFILLED_SYRINGE | INTRAMUSCULAR | Status: DC
  Administered 2021-02-15 – 2021-02-20 (×6): 40 mg via SUBCUTANEOUS
  Filled 2021-02-14 (×6): qty 0.4

## 2021-02-14 MED ORDER — ACETAMINOPHEN 325 MG PO TABS: 650.0000 mg | ORAL_TABLET | Freq: Four times a day (QID) | ORAL | Status: DC | PRN

## 2021-02-14 MED ORDER — VENLAFAXINE HCL ER 75 MG PO CP24: 75.0000 mg | ORAL_CAPSULE | Freq: Every day | ORAL | Status: DC

## 2021-02-14 MED ORDER — CARBIDOPA-LEVODOPA ER 50-200 MG PO TBCR
1.0000 | EXTENDED_RELEASE_TABLET | Freq: Four times a day (QID) | ORAL | Status: DC
  Administered 2021-02-15 (×2): 1 via ORAL
  Filled 2021-02-14 (×4): qty 1

## 2021-02-14 MED ORDER — SENNOSIDES-DOCUSATE SODIUM 8.6-50 MG PO TABS: 1.0000 | ORAL_TABLET | Freq: Every evening | ORAL | Status: DC | PRN

## 2021-02-14 MED ORDER — QUETIAPINE FUMARATE 25 MG PO TABS
50.0000 mg | ORAL_TABLET | Freq: Every day | ORAL | Status: DC
  Administered 2021-02-15 – 2021-02-17 (×3): 50 mg via ORAL
  Filled 2021-02-14 (×3): qty 2

## 2021-02-14 MED ORDER — ONDANSETRON HCL 4 MG PO TABS: 4.0000 mg | ORAL_TABLET | Freq: Four times a day (QID) | ORAL | Status: DC | PRN

## 2021-02-14 MED ORDER — ONDANSETRON HCL 4 MG/2ML IJ SOLN: 4.0000 mg | Freq: Four times a day (QID) | INTRAMUSCULAR | Status: DC | PRN

## 2021-02-14 MED ORDER — CEFTRIAXONE SODIUM 1 G IJ SOLR
1.0000 g | INTRAMUSCULAR | Status: DC
  Administered 2021-02-15 – 2021-02-16 (×2): 1 g via INTRAVENOUS
  Filled 2021-02-14 (×2): qty 10
  Filled 2021-02-14: qty 1

## 2021-02-14 MED ORDER — ACETAMINOPHEN 650 MG RE SUPP: 650.0000 mg | Freq: Four times a day (QID) | RECTAL | Status: DC | PRN

## 2021-02-14 NOTE — H&P (Signed)
History and Physical    Russell Terry [id number] DOB: 1940-07-26 DOA: 02/14/2021  PCP: Maryland Pink, MD   Patient coming from: home  I have personally briefly reviewed patient's old medical records in [place]  Chief Complaint: unresponsiveness  HPI: Russell Terry is a 80 y.o. male with medical history significant for Lewy Body dementia, parkinsonian tremor on Sinemet, A. fib, COPD and HTN, BPH, renal cell CA and nephrolithiasis, with history of frequent UTIs  who has been on three different antibiotics over the past month who was brought in for evaluation of unresponsiveness.  Most of the history given by the wife at the bedside who states that patient was in his usual state of health earlier in the morning and interactive however she saw him sitting in the recliner where he was for several hours.  When she checked on him he was difficult to arouse though he appeared to be breathing and in no distress.  History limited due to patient's dementia  ED course: On arrival afebrile, BP 151/115, pulse 80 with O2 sat 98% on room air. Blood work for the most part unremarkable.  Creatinine 1.62, slightly above baseline of 1.4.  Urinalysis with large leukocyte esterase and rare bacteria.  Troponin 16, BNP 145  EKG, personally viewed and interpreted: Sinus at 74 with poor R wave progression  Imaging: CT head nonacute, chest x-ray nonacute  Patient started on Rocephin.  Hospitalist consulted for admission.  Review of Systems: Unable to obtain due to lethargy   Past Medical History:  Diagnosis Date   Atrial fibrillation ([place]) 12/11/2013   BPH (benign prostatic hyperplasia)    Bradycardia 12/11/2013   Calculus of kidney 12/06/2013   Cancer ([place])    rt kidney removed   Chronic kidney disease    stones,rt kidney removed   COPD (chronic obstructive pulmonary disease) ([place]) 12/11/2013   DDD (degenerative disc disease), lumbar 12/13/2013   Delirium    Dementia ([name])    Elevated  prostate specific antigen (PSA) 03/03/2013   Encephalopathy acute 07/22/2016   Enlarged prostate with lower urinary tract symptoms (LUTS) 03/03/2013   Essential hypertension 07/22/2016   Hyperlipidemia, unspecified 07/09/2017   Hypertension    Meningioma ([place]) 10/08/2016   Parkinsonian features 10/08/2016   Pleuritic chest pain 05/31/2014   Renal cell carcinoma ([place]) 05/04/2016   Renal cyst, acquired, left 04/22/2015    Past Surgical History:  Procedure Laterality Date   BRAIN SURGERY  2017   COLON SURGERY     COLONOSCOPY WITH PROPOFOL N/A 12/31/2016   Procedure: COLONOSCOPY WITH PROPOFOL;  Surgeon: Lollie Sails, MD;  Location[facility]COPY;  Service: Endoscopy;  Laterality: N/A;   CYSTOSCOPY WITH FULGERATION     IR EMBO TUMOR ORGAN ISCHEMIA INFARCT INC GUIDE ROADMAPPING  12/24/2019   IR RADIOLOGIST EVAL & MGMT  11/23/2019   IR RADIOLOGIST EVAL & MGMT  01/11/2020   IR RADIOLOGIST EVAL & MGMT  04/04/2020   IR US GUIDE VASC ACCESS RIGHT  12/24/2019   LUMBAR LAMINECTOMY/DECOMPRESSION MICRODISCECTOMY N/A 10/18/2015   Procedure: LUMBAR LAMINECTOMY/DECOMPRESSION MICRODISCECTOMY 3 LEVELS;  Surgeon: Newman Pies, MD;  Location: [facility]URO ORS;  Service: Neurosurgery;  Laterality: N/A;  L23 L34 L45 laminectomy and foraminotomy   NEPHRECTOMY     2012   URETEROSCOPY WITH HOLMIUM LASER LITHOTRIPSY       reports that he has been smoking e-cigarettes and cigarettes. He has a 97.50 pack-year smoking history. He has never used smokeless tobacco. He reports that he  does not drink alcohol and does not use drugs.  Allergies  Allergen Reactions   Quinapril Cough   Ambien [Zolpidem Tartrate] Other (See Comments)    Unspecified reaction   Fenofibrate Other (See Comments)    Unknown   Amoxicillin-Pot Clavulanate Nausea And Vomiting   Morphine Nausea Only    Family History  Problem Relation Age of Onset   Alzheimer's disease Mother    Alzheimer's disease Sister    Bladder Cancer Neg Hx     Prolactinoma Neg Hx    Prostate cancer Neg Hx       Prior to Admission medications   Medication Sig Start Date End Date Taking? Authorizing Provider  aspirin EC 81 MG tablet Take 81 mg by mouth daily.   Yes [provider]  bisacodyl 5 MG EC tablet Take 5 mg by mouth daily as needed for moderate constipation.   Yes [provider]  carbidopa-levodopa (SINEMET CR) 50-200 MG tablet Take 1 tablet by mouth 4 (four) times daily.   Yes [provider]  diltiazem (CARDIZEM CD) 240 MG 24 hr capsule Take 240 mg by mouth daily.    Yes [provider]  donepezil (ARICEPT) 10 MG tablet Take 10 mg by mouth at bedtime.   Yes [provider]  dutasteride (AVODART) 0.5 MG capsule Take 0.5 mg by mouth daily.   Yes [provider]  levofloxacin (LEVAQUIN) 250 MG tablet Take 250 mg by mouth in the morning and at bedtime.   Yes [provider]  QUEtiapine (SEROQUEL) 25 MG tablet Take 50 mg by mouth daily. 03/16/20  Yes [provider]  rOPINIRole (REQUIP) 0.5 MG tablet Take 0.5 mg by mouth in the morning, at noon, in the evening, and at bedtime.   Yes [provider]  traZODone (DESYREL) 100 MG tablet Take 200 mg by mouth at bedtime. 07/04/18  Yes [provider]  venlafaxine XR (EFFEXOR-XR) 75 MG 24 hr capsule Take 75 mg by mouth at bedtime.  04/14/18  Yes [provider]  acetaminophen (TYLENOL) 325 MG tablet Take 2 tablets (650 mg total) by mouth every 6 (six) hours as needed for mild pain (or Fever >/= 101). Patient not taking: Reported on 02/14/2021 04/25/19   Otila Back, MD  albuterol (VENTOLIN HFA) 108 (90 Base) MCG/ACT inhaler Inhale 2 puffs into the lungs every 6 (six) hours as needed for wheezing or shortness of breath.  Patient not taking: No sig reported 01/11/19   [provider]  cetirizine (ZYRTEC) 10 MG tablet Take 10 mg by mouth daily. Patient not taking: Reported on 02/14/2021    [provider]  Melatonin 5 MG TABS Take 15 mg by mouth at bedtime.  Patient not taking: Reported on 02/14/2021    [provider]  polyethylene glycol (MIRALAX / GLYCOLAX) 17 g packet Take 17 g by mouth daily as needed for moderate constipation. Patient not taking: Reported on 02/14/2021    [provider]  rivastigmine (EXELON) 1.5 MG capsule Take 1 capsule by mouth in the morning and at bedtime. Patient not taking: Reported on 02/14/2021 12/19/20 12/19/21  [provider]  clonazePAM (KLONOPIN) 0.5 MG tablet Take by mouth. 03/16/20 05/19/20  [provider]    Physical Exam: Vitals:   02/14/21 1851 02/14/21 1930  BP: (!) 151/115 (!) 152/117  Pulse:  71  Resp:  18  Temp: 98.1 F (36.7 C)   TempSrc: Oral   SpO2: 98% 96%  Weight: 82 kg  Height: '5\' 10"'$  (1.778 m)      Vitals:   02/14/21 1851 02/14/21 1930  BP: (!) 151/115 (!) 152/117  Pulse:  71  Resp:  18  Temp: 98.1 F (36.7 C)   TempSrc: Oral   SpO2: 98% 96%  Weight: 82 kg   Height: '5\' 10"'$  (1.778 m)       Constitutional: Very lethargic and difficult to arouse but will open eyes to tapping on shoulder. Not in any apparent distress HEENT:      Head: Normocephalic and atraumatic.         Eyes: PERLA, EOMI, Conjunctivae are normal. Sclera is non-icteric.       Mouth/Throat: Mucous membranes are moist.       Neck: Supple with no signs of meningismus. Cardiovascular: Regular rate and rhythm. No murmurs, gallops, or rubs. 2+ symmetrical distal pulses are present . No JVD. No LE edema Respiratory: Respiratory effort normal .Lungs sounds clear bilaterally. No wheezes, crackles, or rhonchi.  Gastrointestinal: Soft, non tender, and non distended with positive bowel sounds.  Genitourinary: No CVA tenderness. Musculoskeletal: Nontender with normal range of motion in all extremities. No cyanosis, or erythema of extremities. Neurologic:  Face is symmetric. Moving all extremities. No gross focal neurologic  deficits . Skin: Skin is warm, dry.  No rash or ulcers Psychiatric: Unable to assess due to lethargy   Labs on Admission: I have personally reviewed following labs and imaging studies  CBC: Recent Labs  Lab 02/14/21 1913  WBC 6.1  NEUTROABS 3.5  HGB 15.4  HCT 44.5  MCV 94.1  PLT 99991111   Basic Metabolic Panel: Recent Labs  Lab 02/14/21 1913  NA 135  K 4.4  CL 102  CO2 26  GLUCOSE 104*  BUN 22  CREATININE 1.62*  CALCIUM 9.0   GFR: Estimated Creatinine Clearance: 37.6 mL/min (A) (by C-G formula based on SCr of 1.62 mg/dL (H)). Liver Function Tests: Recent Labs  Lab 02/14/21 1913  AST 20  ALT 11  ALKPHOS 59  BILITOT 1.3*  PROT 7.1  ALBUMIN 4.3   No results for input(s): LIPASE, AMYLASE in the last 168 hours. No results for input(s): AMMONIA in the last 168 hours. Coagulation Profile: No results for input(s): INR, PROTIME in the last 168 hours. Cardiac Enzymes: No results for input(s): CKTOTAL, CKMB, CKMBINDEX, TROPONINI in the last 168 hours. BNP (last 3 results) No results for input(s): PROBNP in the last 8760 hours. HbA1C: No results for input(s): HGBA1C in the last 72 hours. CBG: No results for input(s): GLUCAP in the last 168 hours. Lipid Profile: No results for input(s): CHOL, HDL, LDLCALC, TRIG, CHOLHDL, LDLDIRECT in the last 72 hours. Thyroid Function Tests: No results for input(s): TSH, T4TOTAL, FREET4, T3FREE, THYROIDAB in the last 72 hours. Anemia Panel: No results for input(s): VITAMINB12, FOLATE, FERRITIN, TIBC, IRON, RETICCTPCT in the last 72 hours. Urine analysis:    Component Value Date/Time   COLORURINE YELLOW (A) 02/14/2021 2202   APPEARANCEUR HAZY (A) 02/14/2021 2202   APPEARANCEUR Cloudy (A) 08/08/2020 0935   LABSPEC 1.023 02/14/2021 2202   LABSPEC 1.016 01/01/2013 1221   PHURINE 5.0 02/14/2021 2202   GLUCOSEU NEGATIVE 02/14/2021 2202   GLUCOSEU Negative 01/01/2013 1221   HGBUR NEGATIVE 02/14/2021 2202   BILIRUBINUR NEGATIVE  02/14/2021 2202   BILIRUBINUR Negative 08/08/2020 0935   BILIRUBINUR Negative 01/01/2013 1221   KETONESUR 5 (A) 02/14/2021 2202   PROTEINUR 30 (A) 02/14/2021 2202   NITRITE NEGATIVE 02/14/2021 2202   LEUKOCYTESUR  LARGE (A) 02/14/2021 2202   LEUKOCYTESUR Negative 01/01/2013 1221    Radiological Exams on Admission: CT HEAD WO CONTRAST (5MM)  Result Date: 02/14/2021 CLINICAL DATA:  Dizziness EXAM: CT HEAD WITHOUT CONTRAST TECHNIQUE: Contiguous axial images were obtained from the base of the skull through the vertex without intravenous contrast. COMPARISON:  12/02/2008 FINDINGS: Brain: There is no mass, hemorrhage or extra-axial collection. There is generalized atrophy without lobar predilection. Hypodensity of the white matter is most commonly associated with chronic microvascular disease. Vascular: No abnormal hyperdensity of the major intracranial arteries or dural venous sinuses. No intracranial atherosclerosis. Skull: Old right frontal craniotomy. Sinuses/Orbits: No fluid levels or advanced mucosal thickening of the visualized paranasal sinuses. No mastoid or middle ear effusion. The orbits are normal. IMPRESSION: Chronic microvascular ischemia and generalized atrophy without acute intracranial abnormality. Electronically Signed   By: Ulyses Jarred M.D.   On: 02/14/2021 21:52   DG Chest Portable 1 View  Result Date: 02/14/2021 CLINICAL DATA:  Syncopal episode EXAM: PORTABLE CHEST 1 VIEW COMPARISON:  01/05/2019 FINDINGS: The heart size and mediastinal contours are within normal limits. Aortic atherosclerosis. Both lungs are clear. The visualized skeletal structures are unremarkable. IMPRESSION: No active disease. Electronically Signed   By: Donavan Foil M.D.   On: 02/14/2021 22:02     Assessment/Plan 80 year old male with history of Lewy body dementia, parkinsonian tremor on Sinemet, A. fib, COPD and HTN, BPH, renal cell CA and nephrolithiasis, with history of frequent UTIs currently on Levaquin  who was brought in for evaluation of unresponsiveness.    Acute metabolic encephalopathy secondary to UTI - Patient with several hours of decreased responsiveness during no acute distress - Head CT with no acute findings - Fall aspiration precautions - We will keep n.p.o. - Treat UTI  Complicated UTI with history of Recurrent UTI Nephrolithiasis/BPH/history of renal cell CA s/p nephrectomy -Patient with symptoms in spite of courses of four different antibiotics over the course of one months - IV Rocephin - IV hydration - Follow cultures - Continue Avodart for BPH  Lewy body dementia without behavioral disturbance ([name]) - Continue home Exelon, Seroquel - Continue trazodone and Effexor  Parkinson's disease - Continue Sinemet    Atrial fibrillation ([name]) - Continue diltiazem.  Not currently on anticoagulation    COPD (chronic obstructive pulmonary disease) ([place]) - Not acutely exacerbated.  Albuterol as needed    Essential hypertension - Continue diltiazem    DVT prophylaxis: Lovenox  Code Status: DNR Family Communication: Wife.  Discussed plan of care.  She verbalized understanding and is in agreement.  Discussed CODE STATUS and patient is DNR Disposition Plan: Back to previous home environment Consults called: none  Status:observation    Athena Masse MD [facility]     02/14/2021, 11:03 PM

## 2021-02-14 NOTE — ED Provider Notes (Addendum)
[facility] Emergency Department Provider Note  ____________________________________________   Event Date/Time   First MD Initiated Contact with Patient 02/14/21 1856     (approximate)  I have reviewed the triage vital signs and the nursing notes.   HISTORY  Chief Complaint Loss of Consciousness    HPI Russell Terry is a 80 y.o. male with history of 1 kidney, UTIs, Lewy body dementia who comes in with episode of unresponsiveness.  Patient's wife is at bedside given I I am able to get full HPI from the patient due to baseline dementia.  She states that she found him unresponsive for 6 hours.  He was still breathing but he was not able to be woken up.  She states that this is never happened before, constant.  Nothing made it better or worse.  She called EMS and they brought him here.  Denies any falls.  He was in his recliner.  She reports progressively difficulties taking care of him at home.          Past Medical History:  Diagnosis Date   Atrial fibrillation ([place]) 12/11/2013   BPH (benign prostatic hyperplasia)    Bradycardia 12/11/2013   Calculus of kidney 12/06/2013   Cancer ([place])    rt kidney removed   Chronic kidney disease    stones,rt kidney removed   COPD (chronic obstructive pulmonary disease) ([place]) 12/11/2013   DDD (degenerative disc disease), lumbar 12/13/2013   Delirium    Dementia ([name])    Elevated prostate specific antigen (PSA) 03/03/2013   Encephalopathy acute 07/22/2016   Enlarged prostate with lower urinary tract symptoms (LUTS) 03/03/2013   Essential hypertension 07/22/2016   Hyperlipidemia, unspecified 07/09/2017   Hypertension    Meningioma ([place]) 10/08/2016   Parkinsonian features 10/08/2016   Pleuritic chest pain 05/31/2014   Renal cell carcinoma ([place]) 05/04/2016   Renal cyst, acquired, left 04/22/2015    Patient Active Problem List   Diagnosis Date Noted   Candidiasis of skin 10/31/2020   Hematuria 04/21/2019   Urinary  frequency 02/20/2019   Urinary urgency 02/20/2019   History of elevated PSA 02/20/2019   History of renal cell carcinoma 02/20/2019   UTI (urinary tract infection) 01/05/2019   Personal history of kidney cancer 01/08/2018   Hyperlipidemia, unspecified 07/09/2017   Anxiety, generalized 10/08/2016   Meningioma ([place]) 10/08/2016   Insomnia, persistent 10/08/2016   Parkinsonian features 10/08/2016   Agitation 07/22/2016   Encephalopathy acute 07/22/2016   Essential hypertension 07/22/2016   Pseudomonas urinary tract infection 07/22/2016   Alzheimer's dementia without behavioral disturbance ([place]) 04/18/2016   Neck pain 11/15/2015   Lumbar stenosis with neurogenic claudication 10/18/2015   Renal cyst, acquired, left 04/22/2015   Pleuritic chest pain 05/31/2014   History of rectal bleeding 01/11/2014   History of kidney removal 12/22/2013   DDD (degenerative disc disease), lumbar 12/13/2013   Atrial fibrillation ([place]) 12/11/2013   Bradycardia 12/11/2013   COPD (chronic obstructive pulmonary disease) ([place]) 12/11/2013   Nephrolithiasis 12/06/2013   Other specified disorders of kidney and ureter 11/17/2013   Benign prostatic hyperplasia with lower urinary tract symptoms 03/03/2013   Elevated prostate specific antigen (PSA) 03/03/2013   Incomplete emptying of bladder 03/03/2013   Nocturia 03/03/2013    Past Surgical History:  Procedure Laterality Date   BRAIN SURGERY  2017   COLON SURGERY     COLONOSCOPY WITH PROPOFOL N/A 12/31/2016   Procedure: COLONOSCOPY WITH PROPOFOL;  Surgeon: Lollie Sails, MD;  Location: [facility] ENDOSCOPY;  Service: Endoscopy;  Laterality: N/A;   CYSTOSCOPY WITH FULGERATION     IR EMBO TUMOR ORGAN ISCHEMIA INFARCT INC GUIDE ROADMAPPING  12/24/2019   IR RADIOLOGIST EVAL & MGMT  11/23/2019   IR RADIOLOGIST EVAL & MGMT  01/11/2020   IR RADIOLOGIST EVAL & MGMT  04/04/2020   IR US GUIDE VASC ACCESS RIGHT  12/24/2019   LUMBAR LAMINECTOMY/DECOMPRESSION MICRODISCECTOMY  N/A 10/18/2015   Procedure: LUMBAR LAMINECTOMY/DECOMPRESSION MICRODISCECTOMY 3 LEVELS;  Surgeon: Newman Pies, MD;  Location: [place] [facility]UR[facility]S;  Service: Neurosurgery;  Laterality: N/A;  L23 L34 L45 laminectomy and foraminotomy   NEPHRECTOMY     2012   URETEROSCOPY WITH HOLMIUM LASER LITHOTRIPSY      Prior to Admission medications   Medication Sig Start Date End Date Taking? Authorizing Provider  acetaminophen (TYLENOL) 325 MG tablet Take 2 tablets (650 mg total) by mouth every 6 (six) hours as needed for mild pain (or Fever >/= 101). 04/25/19   Stark Jock Jude, MD  albuterol (VENTOLIN HFA) 108 (90 Base) MCG/ACT inhaler Inhale 2 puffs into the lungs every 6 (six) hours as needed for wheezing or shortness of breath.  Patient not taking: Reported on 01/09/2021 01/11/19   [provider]  aspirin EC 81 MG tablet Take 81 mg by mouth daily.    [provider]  carbidopa-levodopa (SINEMET CR) 50-200 MG tablet Take 1 tablet by mouth 2 (two) times daily.    [provider]  cetirizine (ZYRTEC) 10 MG tablet Take 10 mg by mouth daily.    [provider]  diltiazem (CARDIZEM CD) 240 MG 24 hr capsule Take 240 mg by mouth daily.     [provider]  dutasteride (AVODART) 0.5 MG capsule Take 0.5 mg by mouth daily.    [provider]  Melatonin 5 MG TABS Take 15 mg by mouth at bedtime.     [provider]  polyethylene glycol (MIRALAX / GLYCOLAX) 17 g packet Take 17 g by mouth daily as needed for moderate constipation.    [provider]  QUEtiapine (SEROQUEL) 25 MG tablet Take 25 mg by mouth 2 (two) times daily. 03/16/20   [provider]  rivastigmine (EXELON) 1.5 MG capsule Take 1 capsule by mouth in the morning and at bedtime. 12/19/20 12/19/21  [provider]  traZODone (DESYREL) 100 MG tablet Take 100 mg by mouth at bedtime.  07/04/18   [provider]  venlafaxine XR (EFFEXOR-XR) 75 MG 24 hr capsule Take 75 mg by mouth  at bedtime.  04/14/18   [provider]  clonazePAM (KLONOPIN) 0.5 MG tablet Take by mouth. 03/16/20 05/19/20  [provider]    Allergies Quinapril, Ambien [zolpidem tartrate], Fenofibrate, Amoxicillin-pot clavulanate, and Morphine  Family History  Problem Relation Age of Onset   Alzheimer's disease Mother    Alzheimer's disease Sister    Bladder Cancer Neg Hx    Prolactinoma Neg Hx    Prostate cancer Neg Hx     Social History Social History   Tobacco Use   Smoking status: Every Day    Packs/day: 1.50    Years: 65.00    Pack years: 97.50    Types: E-cigarettes, Cigarettes   Smokeless tobacco: Never   Tobacco comments:    currently vapes  Vaping Use   Vaping Use: Never used  Substance Use Topics   Alcohol use: No   Drug use: No      Review of Systems Unable to get full review of systems  due to patient's baseline dementia. ____________________________________________   PHYSICAL EXAM:  VITAL SIGNS: ED Triage Vitals [02/14/21 1851]  Enc Vitals Group     BP (!) 151/115     Pulse      Resp      Temp 98.1 F (36.7 C)     Temp Source Oral     SpO2 98 %     Weight 180 lb 12.4 oz (82 kg)     Height '5\' 10"'$  (1.778 m)     Head Circumference      Peak Flow      Pain Score 0     Pain Loc      Pain Edu?      Excl. in [place]?     Constitutional: Patient is awake looking around in bed. Eyes: Conjunctivae are normal. EOMI. Head: Atraumatic. Nose: No congestion/rhinnorhea. Mouth/Throat: Mucous membranes are moist.   Neck: No stridor. Trachea Midline. FROM Cardiovascular: Normal rate, regular rhythm. Grossly normal heart sounds.  Good peripheral circulation. Respiratory: Normal respiratory effort.  No retractions. Lungs CTAB. Gastrointestinal: Soft and nontender. No distention. No abdominal bruits.  Musculoskeletal: No lower extremity tenderness nor edema.  No joint effusions. Neurologic: Patient is unable to follow commands.  Patient is looking around  moving all extremities however. Skin:  Skin is warm, dry and intact. No rash noted. Psychiatric: Mood and affect are normal. Speech and behavior are normal. GU: Deferred   ____________________________________________   LABS (all labs ordered are listed, but only abnormal results are displayed)  Labs Reviewed  COMPREHENSIVE METABOLIC PANEL - Abnormal; Notable for the following components:      Result Value   Glucose, Bld 104 (*)    Creatinine, Ser 1.62 (*)    Total Bilirubin 1.3 (*)    GFR, Estimated 43 (*)    All other components within normal limits  BRAIN NATRIURETIC PEPTIDE - Abnormal; Notable for the following components:   B Natriuretic Peptide 145.3 (*)    All other components within normal limits  CBC WITH DIFFERENTIAL/PLATELET  URINALYSIS, COMPLETE (UACMP) WITH MICROSCOPIC  TROPONIN I (HIGH SENSITIVITY)  TROPONIN I (HIGH SENSITIVITY)   ____________________________________________   ED ECG REPORT I, Vanessa Gibson, the attending physician, personally viewed and interpreted this ECG.  Normal sinus rate 74, no ST elevation, T wave versions in 2 3 aVF V3 through V6, normal intervals ____________________________________________  RADIOLOGY Robert Bellow, personally viewed and evaluated these images (plain radiographs) as part of my medical decision making, as well as reviewing the written report by the radiologist.  ED MD interpretation:  no pna   Official radiology report(s): CT HEAD WO CONTRAST (5MM)  Result Date: 02/14/2021 CLINICAL DATA:  Dizziness EXAM: CT HEAD WITHOUT CONTRAST TECHNIQUE: Contiguous axial images were obtained from the base of the skull through the vertex without intravenous contrast. COMPARISON:  12/02/2008 FINDINGS: Brain: There is no mass, hemorrhage or extra-axial collection. There is generalized atrophy without lobar predilection. Hypodensity of the white matter is most commonly associated with chronic microvascular disease. Vascular: No abnormal  hyperdensity of the major intracranial arteries or dural venous sinuses. No intracranial atherosclerosis. Skull: Old right frontal craniotomy. Sinuses/Orbits: No fluid levels or advanced mucosal thickening of the visualized paranasal sinuses. No mastoid or middle ear effusion. The orbits are normal. IMPRESSION: Chronic microvascular ischemia and generalized atrophy without acute intracranial abnormality. Electronically Signed   By: Ulyses Jarred M.D.   On: 02/14/2021 21:52   DG Chest Portable 1 View  Result Date:  02/14/2021 CLINICAL DATA:  Syncopal episode EXAM: PORTABLE CHEST 1 VIEW COMPARISON:  01/05/2019 FINDINGS: The heart size and mediastinal contours are within normal limits. Aortic atherosclerosis. Both lungs are clear. The visualized skeletal structures are unremarkable. IMPRESSION: No active disease. Electronically Signed   By: Donavan Foil M.D.   On: 02/14/2021 22:02    ____________________________________________   PROCEDURES  Procedure(s) performed (including Critical Care):  .1-3 Lead EKG Interpretation  Date/Time: 02/14/2021 9:23 PM Performed by: Vanessa Keo, MD Authorized by: Vanessa Weston, MD     Interpretation: normal     ECG rate:  70s   ECG rate assessment: normal     Rhythm: sinus rhythm     Ectopy: none     Conduction: normal     ____________________________________________   INITIAL IMPRESSION / ASSESSMENT AND PLAN / ED COURSE  AITHEN BEIN was evaluated in Emergency Department on 02/14/2021 for the symptoms described in the history of present illness. He was evaluated in the context of the global COVID-19 pandemic, which necessitated consideration that the patient might be at risk for infection with the SARS-CoV-2 virus that causes COVID-19. Institutional protocols and algorithms that pertain to the evaluation of patients at risk for COVID-19 are in a state of rapid change based on information released by regulatory bodies including the CDC and federal and  state organizations. These policies and algorithms were followed during the patient's care in the ED.    Patient comes in with episode of unresponsiveness with family.  This lasted over 6 hours.  Labs ordered to evaluate for [place] abnormalities, AKI, CT scan ordered of his head to evaluate for intercranial hemorrhage, hydrocephalus, edema or any other cause of altered mental status.  Unable to get a great neuro exam due to patient's dementia.  Discussed with family and they feel that he is getting much worse and that they cannot take care of him at home.  Will discuss possible team for admission for work-up for worsening altered mental status and unresponsiveness.  Patient does have EKG changes but his initial troponin was negative.  Patient be kept on the cardiac monitor to evaluate for arrhythmia.  After further discussion with wife she does mention prior mengioma resection requiring radiation. We discussed MRI to further evaluate but given age/comorbidity she declines due to the fact he would not want further rx for it.   Labs re-assuring ct head negative. Will d/w hospital ams workup        ____________________________________________   FINAL CLINICAL IMPRESSION(S) / ED DIAGNOSES   Final diagnoses:  Altered mental status, unspecified altered mental status type      MEDICATIONS GIVEN DURING THIS VISIT:  Medications - No data to display   ED Discharge Orders     None        Note:  This document was prepared using Dragon voice recognition software and may include unintentional dictation errors.    Vanessa [place], MD 02/14/21 2256    Vanessa Tatum, MD 02/15/21 [phone]

## 2021-02-14 NOTE — ED Triage Notes (Signed)
Pt presents to the ED via EMS from home with c/o syncopal episode that was witnessed by wife who notified EMS. Pt has hx of dementia and does not remember the event. Pt alert only to self with stable vital signs. EMS states that pt was recently dx with UTI and currently taking Levaquin

## 2021-02-15 DIAGNOSIS — Z885 Allergy status to narcotic agent status: Secondary | ICD-10-CM | POA: Diagnosis not present

## 2021-02-15 DIAGNOSIS — Z85528 Personal history of other malignant neoplasm of kidney: Secondary | ICD-10-CM | POA: Diagnosis not present

## 2021-02-15 DIAGNOSIS — T4275XA Adverse effect of unspecified antiepileptic and sedative-hypnotic drugs, initial encounter: Secondary | ICD-10-CM | POA: Diagnosis present

## 2021-02-15 DIAGNOSIS — Z20822 Contact with and (suspected) exposure to covid-19: Secondary | ICD-10-CM | POA: Diagnosis present

## 2021-02-15 DIAGNOSIS — Z905 Acquired absence of kidney: Secondary | ICD-10-CM | POA: Diagnosis not present

## 2021-02-15 DIAGNOSIS — J449 Chronic obstructive pulmonary disease, unspecified: Secondary | ICD-10-CM | POA: Diagnosis present

## 2021-02-15 DIAGNOSIS — N200 Calculus of kidney: Secondary | ICD-10-CM | POA: Diagnosis present

## 2021-02-15 DIAGNOSIS — Z7189 Other specified counseling: Secondary | ICD-10-CM | POA: Diagnosis not present

## 2021-02-15 DIAGNOSIS — I129 Hypertensive chronic kidney disease with stage 1 through stage 4 chronic kidney disease, or unspecified chronic kidney disease: Secondary | ICD-10-CM | POA: Diagnosis present

## 2021-02-15 DIAGNOSIS — F0280 Dementia in other diseases classified elsewhere without behavioral disturbance: Secondary | ICD-10-CM | POA: Diagnosis present

## 2021-02-15 DIAGNOSIS — Z881 Allergy status to other antibiotic agents status: Secondary | ICD-10-CM | POA: Diagnosis not present

## 2021-02-15 DIAGNOSIS — Z888 Allergy status to other drugs, medicaments and biological substances status: Secondary | ICD-10-CM | POA: Diagnosis not present

## 2021-02-15 DIAGNOSIS — Z515 Encounter for palliative care: Secondary | ICD-10-CM | POA: Diagnosis not present

## 2021-02-15 DIAGNOSIS — Z820 Family history of epilepsy and other diseases of the nervous system: Secondary | ICD-10-CM | POA: Diagnosis not present

## 2021-02-15 DIAGNOSIS — N1831 Chronic kidney disease, stage 3a: Secondary | ICD-10-CM | POA: Diagnosis present

## 2021-02-15 DIAGNOSIS — E860 Dehydration: Secondary | ICD-10-CM | POA: Diagnosis present

## 2021-02-15 DIAGNOSIS — E785 Hyperlipidemia, unspecified: Secondary | ICD-10-CM | POA: Diagnosis present

## 2021-02-15 DIAGNOSIS — K5900 Constipation, unspecified: Secondary | ICD-10-CM | POA: Diagnosis present

## 2021-02-15 DIAGNOSIS — N401 Enlarged prostate with lower urinary tract symptoms: Secondary | ICD-10-CM | POA: Diagnosis present

## 2021-02-15 DIAGNOSIS — I4891 Unspecified atrial fibrillation: Secondary | ICD-10-CM | POA: Diagnosis present

## 2021-02-15 DIAGNOSIS — G9341 Metabolic encephalopathy: Secondary | ICD-10-CM | POA: Diagnosis present

## 2021-02-15 DIAGNOSIS — Z66 Do not resuscitate: Secondary | ICD-10-CM | POA: Diagnosis present

## 2021-02-15 DIAGNOSIS — G20 Parkinson's disease: Secondary | ICD-10-CM | POA: Diagnosis present

## 2021-02-15 DIAGNOSIS — Z79899 Other long term (current) drug therapy: Secondary | ICD-10-CM | POA: Diagnosis not present

## 2021-02-15 DIAGNOSIS — G309 Alzheimer's disease, unspecified: Secondary | ICD-10-CM | POA: Diagnosis present

## 2021-02-15 DIAGNOSIS — R1310 Dysphagia, unspecified: Secondary | ICD-10-CM | POA: Diagnosis present

## 2021-02-15 LAB — CBC
HCT: 39.7 % (ref 39.0–52.0)
Hemoglobin: 13.8 g/dL (ref 13.0–17.0)
MCH: 32.2 pg (ref 26.0–34.0)
MCHC: 34.8 g/dL (ref 30.0–36.0)
MCV: 92.5 fL (ref 80.0–100.0)
Platelets: 189 10*3/uL (ref 150–400)
RBC: 4.29 MIL/uL (ref 4.22–5.81)
RDW: 13.6 % (ref 11.5–15.5)
WBC: 5.5 10*3/uL (ref 4.0–10.5)
nRBC: 0 % (ref 0.0–0.2)

## 2021-02-15 LAB — CREATININE, SERUM
Creatinine, Ser: 1.57 mg/dL — ABNORMAL HIGH (ref 0.61–1.24)
GFR, Estimated: 44 mL/min — ABNORMAL LOW (ref >60)

## 2021-02-15 LAB — RESP PANEL BY RT-PCR (FLU A&B, COVID) ARPGX2
Influenza A by PCR: NEGATIVE
Influenza B by PCR: NEGATIVE
SARS Coronavirus 2 by RT PCR: NEGATIVE

## 2021-02-15 LAB — PHOSPHORUS: Phosphorus: 2.5 mg/dL (ref 2.5–4.6)

## 2021-02-15 LAB — MAGNESIUM: Magnesium: 1.9 mg/dL (ref 1.7–2.4)

## 2021-02-15 MED ORDER — SODIUM CHLORIDE 0.9 % IV SOLN: INTRAVENOUS | Status: AC

## 2021-02-15 MED ORDER — VENLAFAXINE HCL 37.5 MG PO TABS
37.5000 mg | ORAL_TABLET | Freq: Two times a day (BID) | ORAL | Status: DC
  Administered 2021-02-15 – 2021-02-20 (×11): 37.5 mg via ORAL
  Filled 2021-02-15 (×12): qty 1

## 2021-02-15 MED ORDER — CARBIDOPA-LEVODOPA 25-100 MG PO TABS
2.0000 | ORAL_TABLET | Freq: Four times a day (QID) | ORAL | Status: DC
  Administered 2021-02-15 – 2021-02-20 (×19): 2 via ORAL
  Filled 2021-02-15 (×20): qty 2

## 2021-02-15 MED ORDER — HYDRALAZINE HCL 20 MG/ML IJ SOLN
10.0000 mg | Freq: Four times a day (QID) | INTRAMUSCULAR | Status: DC | PRN
  Administered 2021-02-15 – 2021-02-18 (×4): 10 mg via INTRAVENOUS
  Filled 2021-02-15 (×4): qty 1

## 2021-02-15 MED ORDER — DILTIAZEM HCL 30 MG PO TABS
60.0000 mg | ORAL_TABLET | Freq: Four times a day (QID) | ORAL | Status: DC
  Administered 2021-02-16 – 2021-02-20 (×17): 60 mg via ORAL
  Filled 2021-02-15 (×19): qty 2

## 2021-02-15 NOTE — Evaluation (Addendum)
Clinical/Bedside Swallow Evaluation Patient Details  Name: Russell Terry MRN: [id number] Date of Birth: [birth year]  Today's Date: 02/15/2021 Time: SLP Start Time (ACUTE ONLY): 1500 SLP Stop Time (ACUTE ONLY): 1600 SLP Time Calculation (min) (ACUTE ONLY): 60 min  Past Medical History:  Past Medical History:  Diagnosis Date   Atrial fibrillation ([place]) 12/11/2013   BPH (benign prostatic hyperplasia)    Bradycardia 12/11/2013   Calculus of kidney 12/06/2013   Cancer ([place])    rt kidney removed   Chronic kidney disease    stones,rt kidney removed   COPD (chronic obstructive pulmonary disease) ([place]) 12/11/2013   DDD (degenerative disc disease), lumbar 12/13/2013   Delirium    Dementia ([name])    Elevated prostate specific antigen (PSA) 03/03/2013   Encephalopathy acute 07/22/2016   Enlarged prostate with lower urinary tract symptoms (LUTS) 03/03/2013   Essential hypertension 07/22/2016   Hyperlipidemia, unspecified 07/09/2017   Hypertension    Meningioma ([place]) 10/08/2016   Parkinsonian features 10/08/2016   Pleuritic chest pain 05/31/2014   Renal cell carcinoma ([place]) 05/04/2016   Renal cyst, acquired, left 04/22/2015   Past Surgical History:  Past Surgical History:  Procedure Laterality Date   BRAIN SURGERY  2017   COLON SURGERY     COLONOSCOPY WITH PROPOFOL N/A 12/31/2016   Procedure: COLONOSCOPY WITH PROPOFOL;  Surgeon: Lollie Sails, MD;  Location: [facility] ENDOSCOPY;  Service: Endoscopy;  Laterality: N/A;   CYSTOSCOPY WITH FULGERATION     IR EMBO TUMOR ORGAN ISCHEMIA INFARCT INC GUIDE ROADMAPPING  12/24/2019   IR RADIOLOGIST EVAL & MGMT  11/23/2019   IR RADIOLOGIST EVAL & MGMT  01/11/2020   IR RADIOLOGIST EVAL & MGMT  04/04/2020   IR US GUIDE VASC ACCESS RIGHT  12/24/2019   LUMBAR LAMINECTOMY/DECOMPRESSION MICRODISCECTOMY N/A 10/18/2015   Procedure: LUMBAR LAMINECTOMY/DECOMPRESSION MICRODISCECTOMY 3 LEVELS;  Surgeon: Newman Pies, MD;  Location: [place];  Service: Neurosurgery;   Laterality: N/A;  L23 L34 L45 laminectomy and foraminotomy   NEPHRECTOMY     2012   URETEROSCOPY WITH HOLMIUM LASER LITHOTRIPSY     HPI:  Patient is a 80 y.o. male with medical history significant for Lewy Body Dementia, Parkinsonian tremor on Sinemet, A. fib, COPD and HTN, BPH, renal cell CA and nephrolithiasis, with history of frequent UTIs  who has been on three different antibiotics over the past month who was brought in for evaluation of unresponsiveness.  Patient admitted to the hospital with altered mental status.  Patient has a history of Lewy Body Dementia.  RNCM met with patient and his wife at the beside.  Wife reports that patient has been declining rapidly over the past few weeks.  Patient is able to walk with a walker but he cannot get to bathroom by himself or bath himself.  Wife gets patient up every morning and gets him dressed, she says she can usually get him to the restroom maybe twice daily he is incontinent and wears depends.  Wife is having an increasing hard time caring for him at home by herself.  She is interested in short term rehab with transition to LTC.  Head CT: Chronic microvascular ischemia and generalized atrophy without acute  intracranial abnormality.  CXR: No active disease.  Pt admitted: Acute metabolic encephalopathy secondary to UTI, recurrent UTI.   Assessment / Plan / Recommendation Clinical Impression  Pt appears to present w/ grossly adequate oropharyngeal phase swallow function w/ No gross oropharyngeal phase deficits noted, No neuromuscular weakness noted. However, pt  has Baseline Cognitive decline w/ Parkinsonism, Lewy Body Dementia per chart notes. He presents w/ decreased awareness and attention to tasks during eating/drinking. He is able to follow through w/ basic po tasks and self-feeding given Mod cues and monitoring though he did not initiate self-feeding often. These factors can increase risk for choking, aspiration. Pt consumed po trials w/ No overt,  clinical s/s of aspiration during po trials. Pt appears at reduced risk for aspiration following general aspiration precautions, support w/ setup and feeding guidance at meals, and monitoring during oral intake for any implsive feeding behaviors.         During po trials, pt consumed all consistencies w/ no overt coughing, decline in vocal quality, or change in respiratory presentation during/post trials. Oral phase appeared grossly [facility] w/ timely bolus management and control of bolus propulsion for A-P transfer for swallowing. Min+ increased Time needed for mastication of the increased textured trials -- already chopped Small as is pt's Baseline at home per Wife. Foods were also moistened well for cohesion. Oral clearing achieved w/ all trial consistencies w/ min Time. Pt was cued during holding cup/self-drinking and to use SMALLER sips to lessen risk for choking. OM Exam was cursory d/t Cognitive decline and poor follow through but appeared [facility] w/ no unilateral weakness noted during bolus management. Speech Clear; tangential. Pt did not immediately feed self w/ setup support -- hand over hand necessary.   Recommend a more MINCED foods consistency diet(as given at Home for his consistency) w/ moistened foods; Thin liquids. Recommend general aspiration precautions including feeding support/monitoring during meals AND to check for oral clearing b/t bites, Pills Crushed in Puree for safer, easier swallowing d/t Baseline Cognitive decline. Reduce distractions at meals. Education given on Pills in Puree; food consistencies and easy to eat options; general aspiration precautions to NSG/pt/posted in chart/room. Also GERD precautions baseline. Recommend Pharmacy consult for crushability of meds d/t pt "chewing all Pills" Baseline. NSG to reconsult if any new needs arise. NSG agreed. MD updated. SLP Visit Diagnosis: Dysphagia, unspecified (R13.10) (more impact of the oral phase)    Aspiration Risk  Mild aspiration  risk;Risk for inadequate nutrition/hydration (reduced following precautions)    Diet Recommendation  MINCED foods consistency diet(as given at Home for his consistency) w/ moistened foods; Thin liquids. Recommend general aspiration precautions including feeding support/monitoring during meals AND to check for oral clearing b/t bites. Allow pt to Hold his Cup when drinking for neuro input to reduce risk for aspiration.   Medication Administration: Crushed with puree (or other appropriate forms)    Other  Recommendations Recommended Consults:  (Dietician f/u; Pharmacy consult re: meds, crushing) Oral Care Recommendations: Oral care BID;Oral care before and after PO;Staff/trained caregiver to provide oral care Other Recommendations:  (n/a)   Follow up Recommendations None      Frequency and Duration  (n/a)   (n/a)       Prognosis Prognosis for Safe Diet Advancement: Fair (-good) Barriers to Reach Goals: Cognitive deficits;Language deficits;Time post onset;Severity of deficits;Behavior Barriers/Prognosis Comment: chronic Cognitive decline      Swallow Study   General Date of Onset: 02/14/21 HPI: Patient is a 80 y.o. male with medical history significant for Lewy Body Dementia, Parkinsonian tremor on Sinemet, A. fib, COPD and HTN, BPH, renal cell CA and nephrolithiasis, with history of frequent UTIs  who has been on three different antibiotics over the past month who was brought in for evaluation of unresponsiveness.  Patient admitted to the hospital with altered  mental status.  Patient has a history of Lewy Body Dementia.  RNCM met with patient and his wife at the beside.  Wife reports that patient has been declining rapidly over the past few weeks.  Patient is able to walk with a walker but he cannot get to bathroom by himself or bath himself.  Wife gets patient up every morning and gets him dressed, she says she can usually get him to the restroom maybe twice daily he is incontinent and wears  depends.  Wife is having an increasing hard time caring for him at home by herself.  She is interested in short term rehab with transition to LTC.  Head CT: Chronic microvascular ischemia and generalized atrophy without acute  intracranial abnormality.  CXR: No active disease.  Pt admitted: Acute metabolic encephalopathy secondary to UTI, recurrent UTI. Type of Study: Bedside Swallow Evaluation Previous Swallow Assessment: 2020 Diet Prior to this Study: Dysphagia 2 (chopped);Thin liquids (per Wife's description) Temperature Spikes Noted: No (wbc 5.5) Respiratory Status: Room air History of Recent Intubation: No Behavior/Cognition: Alert;Cooperative;Pleasant mood;Confused;Distractible;Requires cueing;Doesn't follow directions Oral Cavity Assessment:  (unable to assess d/t pt's poor participation d/t Cognitive decline) Oral Care Completed by SLP: No Oral Cavity - Dentition: Adequate natural dentition (appeared when accepting boluses) Vision: Functional for self-feeding Self-Feeding Abilities: Able to feed self;Needs assist;Needs set up;Total assist (Cognitive decline impacted) Patient Positioning: Upright in bed (needed support positioning) Baseline Vocal Quality: Normal;Low vocal intensity (mumbled at times) Volitional Cough: Cognitively unable to elicit Volitional Swallow: Unable to elicit    Oral/Motor/Sensory Function Overall Oral Motor/Sensory Function: Within functional limits (during bolus management and clearing)   Ice Chips Ice chips: Within functional limits Presentation: Spoon (fed; 2 trials)   Thin Liquid Thin Liquid: Within functional limits Presentation: Self Fed;Straw (supported, assisted - ~7-8 ozs total) Other Comments: water, juice, ensure    Nectar Thick Nectar Thick Liquid: Not tested   Honey Thick Honey Thick Liquid: Not tested   Puree Puree: Within functional limits Presentation: Spoon (fed; 8 trials)   Solid     Solid: Impaired Presentation: Spoon (fed; 8-9  trials) Oral Phase Impairments: Poor awareness of bolus (moreos during oral prep) Oral Phase Functional Implications: Prolonged oral transit (min) Pharyngeal Phase Impairments:  (none)        Orinda Kenner, MS, [employer] [facility] [phone] , 02/15/2021,4:33 PM

## 2021-02-15 NOTE — TOC Initial Note (Signed)
Transition of Care [facility]) - Initial/Assessment Note    Patient Details  Name: Russell Terry MRN: [id number] Date of Birth: 07-14-1941  Transition of Care [facility]) CM/SW Contact:    Shelbie Hutching, RN Phone Number: 02/15/2021, 4:04 PM  Clinical Narrative:                 Patient admitted to the hospital with altered mental status.  Patient has a history of Lewi Body Dementia.  RNCM met with patient and his wife at the beside.  Wife reports that patient has been declining rapidly over the past few weeks.  Patient is able to walk with a walker but he cannot get to bathroom by himself or bath himself.  Wife gets patient up every morning and gets him dressed, she says she can usually get him to the restroom maybe twice daily he is incontinent and wears depends.  Wife is having an increasing hard time caring for him at home by herself.  She is interested in short term rehab with transition to LTC.  He does not have Medicaid.  RNCM messaged Financial counselor about helping to apply for Medicaid.    Therapy evaluations are pending.  RNCM will begin bed search.    Expected Discharge Plan: Skilled Nursing Facility Barriers to Discharge: Continued Medical Work up   Patient Goals and CMS Choice Patient states their goals for this hospitalization and ongoing recovery are:: Wife is unable to care for patient at home all by herself, she is hoping for short term rehab with transition to LTC CMS Medicare.gov Compare Post Acute Care list provided to:: Patient Represenative (must comment) Choice offered to / list presented to : Spouse  Expected Discharge Plan and Services Expected Discharge Plan: [place]   Discharge Planning Services: CM Consult Post Acute Care Choice: [place] Living arrangements for the past 2 months: Single Family Home                 DME Arranged: N/A DME Agency: NA       HH Arranged: NA          Prior Living Arrangements/Services Living  arrangements for the past 2 months: Single Family Home Lives with:: Spouse Patient language and need for interpreter reviewed:: Yes Do you feel safe going back to the place where you live?: Yes      Need for Family Participation in Patient Care: Yes (Comment) (dementia) Care giver support system in place?: Yes (comment) (wife) Current home services: DME, Home RN (walker, wheelchair, 3 in 1) Criminal Activity/Legal Involvement Pertinent to Current Situation/Hospitalization: No - Comment as needed  Activities of Daily Living Home Assistive Devices/Equipment: None ADL Screening (condition at time of admission) Patient's cognitive ability adequate to safely complete daily activities?: No Is the patient deaf or have difficulty hearing?: No Does the patient have difficulty seeing, even when wearing glasses/contacts?: No Does the patient have difficulty concentrating, remembering, or making decisions?: Yes Patient able to express need for assistance with ADLs?: No Does the patient have difficulty dressing or bathing?: Yes Independently performs ADLs?: No Does the patient have difficulty walking or climbing stairs?: Yes Weakness of Legs: Both Weakness of Arms/Hands: Both  Permission Sought/Granted Permission sought to share information with : Case Manager, Family Supports, Chartered certified accountant granted to share information with : Yes, Verbal Permission Granted  Share Information with NAME: [name]  Permission granted to share info w AGENCY: SNF's  Permission granted to share info w Relationship:  wife     Emotional Assessment Appearance:: Appears stated age Attitude/Demeanor/Rapport: Unable to Assess   Orientation: : Oriented to Self Alcohol / Substance Use: Not Applicable Psych Involvement: No (comment)  Admission diagnosis:  Altered mental status, unspecified altered mental status type [J62.83] Acute metabolic encephalopathy [T51.76] Patient Active Problem List    Diagnosis Date Noted   Acute metabolic encephalopathy 16/01/3709   Candidiasis of skin 10/31/2020   Hematuria 04/21/2019   Urinary frequency 02/20/2019   Urinary urgency 02/20/2019   History of elevated PSA 02/20/2019   History of renal cell carcinoma 02/20/2019   Recurrent UTI 01/05/2019   Personal history of kidney cancer 01/08/2018   Hyperlipidemia, unspecified 07/09/2017   Anxiety, generalized 10/08/2016   Meningioma ([place]) 10/08/2016   Insomnia, persistent 10/08/2016   Parkinsonian features 10/08/2016   Agitation 07/22/2016   Encephalopathy acute 07/22/2016   Essential hypertension 07/22/2016   Pseudomonas urinary tract infection 07/22/2016   Alzheimer's dementia without behavioral disturbance ([place]) 04/18/2016   Neck pain 11/15/2015   Lumbar stenosis with neurogenic claudication 10/18/2015   Renal cyst, acquired, left 04/22/2015   Pleuritic chest pain 05/31/2014   History of rectal bleeding 01/11/2014   History of kidney removal 12/22/2013   DDD (degenerative disc disease), lumbar 12/13/2013   Atrial fibrillation ([place]) 12/11/2013   Bradycardia 12/11/2013   COPD (chronic obstructive pulmonary disease) ([place]) 12/11/2013   Nephrolithiasis 12/06/2013   Other specified disorders of kidney and ureter 11/17/2013   Benign prostatic hyperplasia with lower urinary tract symptoms 03/03/2013   Elevated prostate specific antigen (PSA) 03/03/2013   Incomplete emptying of bladder 03/03/2013   Nocturia 03/03/2013   PCP:  [place] Pink, MD Pharmacy:   [id number] [place] [id number]: [phone]: [phone]    Social Determinants of Health (SDOH) Interventions    Readmission Risk Interventions Readmission Risk Prevention Plan 01/08/2019  Post Dischage Appt Complete  Medication Screening Complete  Transportation Screening Complete  Some recent data might be hidden

## 2021-02-15 NOTE — Care Management Obs Status (Signed)
[place] NOTIFICATION   Patient Details  Name: ZAE STONES MRN: [id number] Date of Birth: Jan 21, 1941   Medicare Observation Status Notification Given:  Yes    Shelbie Hutching, RN 02/15/2021, 1:17 PM

## 2021-02-15 NOTE — Progress Notes (Signed)
PROGRESS NOTE    TORENCE CRUZE  [id number] DOB: 06/11/1941 DOA: 02/14/2021 PCP: [place] Pink, MD  111A/[id number]-AA   Assessment & Plan:   Principal Problem:   Acute metabolic encephalopathy Active Problems:   Alzheimer's dementia without behavioral disturbance ([name])   Atrial fibrillation ([place])   COPD (chronic obstructive pulmonary disease) ([place])   Essential hypertension   Nephrolithiasis   Recurrent UTI   History of renal cell carcinoma   OLUFEMI AMADEO is a 80 y.o. male with medical history significant for Lewy Body dementia, parkinsonian tremor on Sinemet, A. fib, COPD and HTN, BPH, renal cell CA and nephrolithiasis, with history of frequent UTIs  who has been on three different antibiotics over the past month who was brought in for evaluation of unresponsiveness.   Acute metabolic encephalopathy possible 2/2 dehydration and/or home sedating meds - Patient with several hours of decreased responsiveness though no acute distress - Head CT with no acute findings --noted to have reduced oral intake.  Pt more alert the next day after IVF.  AMS less likely due to UTI since pt was just recently properly treated with abx. --also takes many sedatives at home Plan: --cont gentle hydration with NS'@75'$  for 12 hours --hold home trazodone   Questionable UTI vs chronic colonization --per wife, pt had been treated for UTI 3-4 times recently based on urine cx results.   --started on ceftriaxone --urine cx not obtained on admission. Plan: --urine cx added on to urine collected on presentation --cont ceftriaxone for now pending cx results  Nephrolithiasis history of renal cell CA s/p nephrectomy   BPH --cont Avodart  Lewy body dementia without behavioral disturbance ([name]) - on home Exelon, Seroquel, trazodone and Effexor and Aricept  --had rapid decline recently Plan: --cont home regimen except for trazodone to reduce amount of sedatives  Parkinson's disease - Continue  Sinemet  Difficulty swallowing --noted by wife --SLP today     Atrial fibrillation ([place]) - Not currently on anticoagulation --cont home cardizem     COPD (chronic obstructive pulmonary disease) ([place]) - Not acutely exacerbated.  Albuterol as needed     Essential hypertension - BP to 190's this morning, pt had missed several days of BP meds due to refusal to swallow pills --Continue diltiazem --IV hydra PRN   DVT prophylaxis: Lovenox SQ Code Status: DNR  Family Communication: wife updated at bedside today Level of care: Med-Surg Dispo:   The patient is from: home Anticipated d/c is to: SNF Anticipated d/c date is: 1-2 days Patient currently is not medically ready to d/c due to: pending SLP and urine cx   Subjective and Interval History:  Wife at bedside provided all hx.  Wife said pt has been having trouble swallowing, therefore having reduced oral intake.  Pt was awake and alert today.  Couldn't tell if pt was having dysuria or abdominal pain.   Objective: Vitals:   02/15/21 0831 02/15/21 1137 02/15/21 1537 02/15/21 1944  BP: (!) 190/110 109/77 138/87 (!) 172/143  Pulse: (!) 54 72 69 73  Resp: '18 18 20 18  '$ Temp: 97.6 F (36.4 C) 97.9 F (36.6 C) 97.6 F (36.4 C) 98 F (36.7 C)  TempSrc: Oral Oral Oral   SpO2: 100% 97% 100% 99%  Weight:      Height:        Intake/Output Summary (Last 24 hours) at 02/15/2021 2012 Last data filed at 02/15/2021 1808 Gross per 24 hour  Intake 225 ml  Output 300 ml  Net -  75 ml   Filed Weights   02/14/21 1851 02/15/21 0307  Weight: 82 kg 78.6 kg    Examination:   Constitutional: NAD, alert, said words, but overall not coherent.   HEENT: conjunctivae and lids normal, EOMI CV: No cyanosis.   RESP: normal respiratory effort, on RA Extremities: No effusions, edema in BLE SKIN: warm, dry   Data Reviewed: I have personally reviewed following labs and imaging studies  CBC: Recent Labs  Lab 02/14/21 1913 02/15/21 0029  WBC  6.1 5.5  NEUTROABS 3.5  --   HGB 15.4 13.8  HCT 44.5 39.7  MCV 94.1 92.5  PLT 191 [id number]   Basic Metabolic Panel: Recent Labs  Lab 02/14/21 1913 02/15/21 0029  NA 135  --   K 4.4  --   CL 102  --   CO2 26  --   GLUCOSE 104*  --   BUN 22  --   CREATININE 1.62* 1.57*  CALCIUM 9.0  --   MG  --  1.9  PHOS  --  2.5   GFR: Estimated Creatinine Clearance: 38.7 mL/min (A) (by C-G formula based on SCr of 1.57 mg/dL (H)). Liver Function Tests: Recent Labs  Lab 02/14/21 1913  AST 20  ALT 11  ALKPHOS 59  BILITOT 1.3*  PROT 7.1  ALBUMIN 4.3   No results for input(s): LIPASE, AMYLASE in the last 168 hours. No results for input(s): AMMONIA in the last 168 hours. Coagulation Profile: No results for input(s): INR, PROTIME in the last 168 hours. Cardiac Enzymes: No results for input(s): CKTOTAL, CKMB, CKMBINDEX, TROPONINI in the last 168 hours. BNP (last 3 results) No results for input(s): PROBNP in the last 8760 hours. HbA1C: No results for input(s): HGBA1C in the last 72 hours. CBG: No results for input(s): GLUCAP in the last 168 hours. Lipid Profile: No results for input(s): CHOL, HDL, LDLCALC, TRIG, CHOLHDL, LDLDIRECT in the last 72 hours. Thyroid Function Tests: No results for input(s): TSH, T4TOTAL, FREET4, T3FREE, THYROIDAB in the last 72 hours. Anemia Panel: No results for input(s): VITAMINB12, FOLATE, FERRITIN, TIBC, IRON, RETICCTPCT in the last 72 hours. Sepsis Labs: No results for input(s): PROCALCITON, LATICACIDVEN in the last 168 hours.  Recent Results (from the past 240 hour(s))  Resp Panel by RT-PCR (Flu A&B, Covid)     Status: None   Collection Time: 02/15/21 12:29 AM  Result Value Ref Range Status   SARS Coronavirus 2 by RT PCR NEGATIVE NEGATIVE Final    Comment: (NOTE) SARS-CoV-2 target nucleic acids are NOT DETECTED.  The SARS-CoV-2 RNA is generally detectable in upper respiratory specimens during the acute phase of infection. The lowest concentration  of SARS-CoV-2 viral copies this assay can detect is 138 copies/mL. A negative result does not preclude SARS-Cov-2 infection and should not be used as the sole basis for treatment or other patient management decisions. A negative result may occur with  improper specimen collection/handling, submission of specimen other than nasopharyngeal swab, presence of viral mutation(s) within the areas targeted by this assay, and inadequate number of viral copies(<138 copies/mL). A negative result must be combined with clinical observations, patient history, and epidemiological information. The expected result is Negative.  Fact Sheet for Patients:  EntrepreneurPulse.com.au  Fact Sheet for Healthcare Providers:  IncredibleEmployment.be  This test is no t yet approved or cleared by the [place] FDA and  has been authorized for detection and/or diagnosis of SARS-CoV-2 by FDA under an Emergency Use Authorization (EUA). This EUA will remain  in effect (meaning this test can be used) for the duration of the COVID-19 declaration under Section 564(b)(1) of the Act, 21 U.S.C.section 360bbb-3(b)(1), unless the authorization is terminated  or revoked sooner.       Influenza A by PCR NEGATIVE NEGATIVE Final   Influenza B by PCR NEGATIVE NEGATIVE Final    Comment: (NOTE) The Xpert Xpress SARS-CoV-2/FLU/RSV plus assay is intended as an aid in the diagnosis of influenza from Nasopharyngeal swab specimens and should not be used as a sole basis for treatment. Nasal washings and aspirates are unacceptable for Xpert Xpress SARS-CoV-2/FLU/RSV testing.  Fact Sheet for Patients: EntrepreneurPulse.com.au  Fact Sheet for Healthcare Providers: IncredibleEmployment.be  This test is not yet approved or cleared by the [place] FDA and has been authorized for detection and/or diagnosis of SARS-CoV-2 by FDA under an Emergency Use  Authorization (EUA). This EUA will remain in effect (meaning this test can be used) for the duration of the COVID-19 declaration under Section 564(b)(1) of the Act, 21 U.S.C. section 360bbb-3(b)(1), unless the authorization is terminated or revoked.  Performed at [facility], [address] [place], [place] [address]       Radiology Studies: CT HEAD WO CONTRAST (5MM)  Result Date: 02/14/2021 CLINICAL DATA:  Dizziness EXAM: CT HEAD WITHOUT CONTRAST TECHNIQUE: Contiguous axial images were obtained from the base of the skull through the vertex without intravenous contrast. COMPARISON:  12/02/2008 FINDINGS: Brain: There is no mass, hemorrhage or extra-axial collection. There is generalized atrophy without lobar predilection. Hypodensity of the white matter is most commonly associated with chronic microvascular disease. Vascular: No abnormal hyperdensity of the major intracranial arteries or dural venous sinuses. No intracranial atherosclerosis. Skull: Old right frontal craniotomy. Sinuses/Orbits: No fluid levels or advanced mucosal thickening of the visualized paranasal sinuses. No mastoid or middle ear effusion. The orbits are normal. IMPRESSION: Chronic microvascular ischemia and generalized atrophy without acute intracranial abnormality. Electronically Signed   By: Ulyses Jarred M.D.   On: 02/14/2021 21:52   DG Chest Portable 1 View  Result Date: 02/14/2021 CLINICAL DATA:  Syncopal episode EXAM: PORTABLE CHEST 1 VIEW COMPARISON:  01/05/2019 FINDINGS: The heart size and mediastinal contours are within normal limits. Aortic atherosclerosis. Both lungs are clear. The visualized skeletal structures are unremarkable. IMPRESSION: No active disease. Electronically Signed   By: Donavan Foil M.D.   On: 02/14/2021 22:02     Scheduled Meds:  carbidopa-levodopa  2 tablet Oral QID   [START ON 02/16/2021] diltiazem  60 mg Oral QID   donepezil  10 mg Oral QHS   dutasteride  0.5 mg Oral Daily    enoxaparin (LOVENOX) injection  40 mg Subcutaneous Q24H   QUEtiapine  50 mg Oral Daily   traZODone  200 mg Oral QHS   venlafaxine  37.5 mg Oral BID WC   Continuous Infusions:  cefTRIAXone (ROCEPHIN)  IV Stopped (02/15/21 0245)     LOS: 0 days     Enzo Bi, MD [facility] If 7PM-7AM, please contact night-coverage 02/15/2021, 8:12 PM

## 2021-02-15 NOTE — Evaluation (Signed)
Physical Therapy Evaluation Patient Details Name: Russell Terry MRN: [id number] DOB: [birth year] Today's Date: 02/15/2021   History of Present Illness  Patient is a  80 y.o. male with medical history significant for Lewy Body dementia, parkinsonian tremor, A. fib, COPD and HTN, BPH, renal cell CA and nephrolithiasis, with history of frequent UTIs  who has been on three different antibiotics over the past month who was brought in for evaluation of unresponsiveness. Found to have acute metabolic encephalopathy secondary to UTI  Clinical Impression  Patient seen with spouse at the bedside. Patient awake with confusion, but is cooperative with distraction and cues for attention to task. He can follow single step commands intermittently with multi-modal cues.  Patient required assistance of one person for bed mobility. Patient able to stand x 3 bouts with +2 person assistance. He was able to take several steps with +2 person assistance but is limited overall by generalized weakness and cognition.  Spouse reports patient has had a progressive decline with function at home recently and she will be unable to manage him at home at this level of care. SNF is recommended at discharge. PT will continue to follow to maximize independence and decrease caregiver burden.     Follow Up Recommendations SNF    Equipment Recommendations  None recommended by PT    Recommendations for Other Services       Precautions / Restrictions Precautions Precautions: Fall Restrictions Weight Bearing Restrictions: No      Mobility  Bed Mobility Overal bed mobility: Needs Assistance Bed Mobility: Supine to Sit;Sit to Supine     Supine to sit: Max assist Sit to supine: Max assist   General bed mobility comments: assistance for BLE and trunk support. verbal cues for task initiation and technique    Transfers Overall transfer level: Needs assistance   Transfers: Sit to/from Stand Sit to Stand: Max assist;+2  physical assistance         General transfer comment: maximal encouragement for participation with standing. 3 bouts of standing performed from bed with +2 person assistance.  Ambulation/Gait             General Gait Details: patient able to take 1-2 small steps close to the bed with Max A +2 person assistance. patient is limited by cognition and generalized weakness  Stairs            Wheelchair Mobility    Modified Rankin (Stroke Patients Only)       Balance Overall balance assessment: Needs assistance Sitting-balance support: Feet supported Sitting balance-Leahy Scale: Fair   Postural control: Left lateral lean Standing balance support: Bilateral upper extremity supported Standing balance-Leahy Scale: Zero Standing balance comment: patient required +2 person assistance to maintain standing balance. standing tolerance of less than one minute                             Pertinent Vitals/Pain Pain Assessment: No/denies pain    Home Living Family/patient expects to be discharged to:: Private residence Living Arrangements: Spouse/significant other Available Help at Discharge: Family;Available 24 hours/day Type of Home: House Home Access: Ramped entrance     Home Layout: One level Home Equipment: Hospital bed;Walker - 2 wheels;Bedside commode (lift chair)      Prior Function Level of Independence: Needs assistance   Gait / Transfers Assistance Needed: prior to 2 weeks ago, patient was walking short distances in house without assistance. patient has required increased assistance for  all mobility recently due to decline in functional mobility at home  ADL's / Homemaking Assistance Needed: spouse assists with all ADLs        Hand Dominance        Extremity/Trunk Assessment   Upper Extremity Assessment Upper Extremity Assessment: Generalized weakness;Difficult to assess due to impaired cognition    Lower Extremity Assessment Lower  Extremity Assessment: Generalized weakness;Difficult to assess due to impaired cognition       Communication   Communication: No difficulties  Cognition Arousal/Alertness: Awake/alert Behavior During Therapy: Flat affect Overall Cognitive Status: History of cognitive impairments - at baseline                                 General Comments: patient able to follow single step commands intermittently with extra time and multi-modal cueing      General Comments      Exercises     Assessment/Plan    PT Assessment Patient needs continued PT services  PT Problem List Decreased strength;Decreased activity tolerance;Decreased balance;Decreased mobility;Decreased safety awareness;Decreased cognition       PT Treatment Interventions DME instruction;Gait training;Functional mobility training;Therapeutic activities;Therapeutic exercise;Balance training;Neuromuscular re-education;Cognitive remediation;Patient/family education    PT Goals (Current goals can be found in the Care Plan section)  Acute Rehab PT Goals Patient Stated Goal: patient unable to participate with goal setting PT Goal Formulation: With family Time For Goal Achievement: 03/01/21 Potential to Achieve Goals: Fair    Frequency Min 2X/week   Barriers to discharge        Co-evaluation               AM-PAC PT "6 Clicks" Mobility  Outcome Measure Help needed turning from your back to your side while in a flat bed without using bedrails?: A Lot Help needed moving from lying on your back to sitting on the side of a flat bed without using bedrails?: Total Help needed moving to and from a bed to a chair (including a wheelchair)?: Total Help needed standing up from a chair using your arms (e.g., wheelchair or bedside chair)?: Total Help needed to walk in hospital room?: Total Help needed climbing 3-5 steps with a railing? : Total 6 Click Score: 7    End of Session   Activity Tolerance: Patient  limited by lethargy Patient left: in bed;with call bell/phone within reach;with bed alarm set;with family/visitor present Nurse Communication: Mobility status PT Visit Diagnosis: Unsteadiness on feet (R26.81);Muscle weakness (generalized) (M62.81)    Time: [id number] PT Time Calculation (min) (ACUTE ONLY): 29 min   Charges:   PT Evaluation $PT Eval Moderate Complexity: 1 Mod PT Treatments $Therapeutic Activity: 8-22 mins        Minna Merritts, PT, MPT   Percell Locus 02/15/2021, 3:41 PM

## 2021-02-15 NOTE — NC FL2 (Signed)
[place] LEVEL OF CARE SCREENING TOOL     IDENTIFICATION  Patient Name: Russell Terry Birthdate: 12-Oct-1940 Sex: male Admission Date (Current Location): 02/14/2021  [facility] and [place] Number:  Engineering geologist and Address:  [facility], [address], [place], [place] [address]      Provider Number: [id number]  Attending Physician Name and Address:  Enzo Bi, MD  Relative Name and Phone Number:  Juvens Matton (wife) [phone]    Current Level of Care: Hospital Recommended Level of Care: [place] Prior Approval Number:    Date Approved/Denied:   PASRR Number: pending  Discharge Plan: SNF    Current Diagnoses: Patient Active Problem List   Diagnosis Date Noted   Acute metabolic encephalopathy [id number]   Candidiasis of skin 10/31/2020   Hematuria 04/21/2019   Urinary frequency 02/20/2019   Urinary urgency 02/20/2019   History of elevated PSA 02/20/2019   History of renal cell carcinoma 02/20/2019   Recurrent UTI 01/05/2019   Personal history of kidney cancer 01/08/2018   Hyperlipidemia, unspecified 07/09/2017   Anxiety, generalized 10/08/2016   Meningioma ([place]) 10/08/2016   Insomnia, persistent 10/08/2016   Parkinsonian features 10/08/2016   Agitation 07/22/2016   Encephalopathy acute 07/22/2016   Essential hypertension 07/22/2016   Pseudomonas urinary tract infection 07/22/2016   Alzheimer's dementia without behavioral disturbance ([place]) 04/18/2016   Neck pain 11/15/2015   Lumbar stenosis with neurogenic claudication 10/18/2015   Renal cyst, acquired, left 04/22/2015   Pleuritic chest pain 05/31/2014   History of rectal bleeding 01/11/2014   History of kidney removal 12/22/2013   DDD (degenerative disc disease), lumbar 12/13/2013   Atrial fibrillation ([place]) 12/11/2013   Bradycardia 12/11/2013   COPD (chronic obstructive pulmonary disease) ([place]) 12/11/2013   Nephrolithiasis 12/06/2013    Other specified disorders of kidney and ureter 11/17/2013   Benign prostatic hyperplasia with lower urinary tract symptoms 03/03/2013   Elevated prostate specific antigen (PSA) 03/03/2013   Incomplete emptying of bladder 03/03/2013   Nocturia 03/03/2013    Orientation RESPIRATION BLADDER Height & Weight     Self  Normal Incontinent Weight: 78.6 kg Height:  '5\' 10"'$  (177.8 cm)  BEHAVIORAL SYMPTOMS/MOOD NEUROLOGICAL BOWEL NUTRITION STATUS     (Lewi Body Dementia) Incontinent Diet (see discharge summary)  AMBULATORY STATUS COMMUNICATION OF NEEDS Skin   Extensive Assist Verbally Normal                       Personal Care Assistance Level of Assistance  Bathing, Feeding, Dressing Bathing Assistance: Maximum assistance Feeding assistance: Maximum assistance Dressing Assistance: Maximum assistance     Functional Limitations Info  Sight, Hearing, Speech Sight Info: Adequate Hearing Info: Adequate Speech Info: Adequate    SPECIAL CARE FACTORS FREQUENCY  PT (By licensed PT), OT (By licensed OT), Speech therapy     PT Frequency: 5 times per week OT Frequency: 3-5 times per week     Speech Therapy Frequency: 2-3 times per week      Contractures Contractures Info: Not present    Additional Factors Info  Code Status, Allergies Code Status Info: DNR Allergies Info: Quinapril, ambien, fenofibrate, amoxicillin, morphine           Current Medications (02/15/2021):  This is the current hospital active medication list Current Facility-Administered Medications  Medication Dose Route Frequency Provider Last Rate Last Admin   acetaminophen (TYLENOL) tablet 650 mg  650 mg Oral Q6H PRN Athena Masse, MD  Or   acetaminophen (TYLENOL) suppository 650 mg  650 mg Rectal Q6H PRN Athena Masse, MD       albuterol (PROVENTIL) (2.5 MG/3ML) 0.083% nebulizer solution 2.5 mg  2.5 mg Inhalation Q6H PRN Athena Masse, MD       carbidopa-levodopa (SINEMET IR) 25-100 MG per tablet  immediate release 2 tablet  2 tablet Oral QID Enzo Bi, MD       cefTRIAXone (ROCEPHIN) 1 g in sodium chloride 0.9 % 100 mL IVPB  1 g Intravenous Q24H Athena Masse, MD   Stopped at 02/15/21 0245   [START ON 02/16/2021] diltiazem (CARDIZEM) tablet 60 mg  60 mg Oral QID Enzo Bi, MD       donepezil (ARICEPT) tablet 10 mg  10 mg Oral QHS Judd Gaudier V, MD       dutasteride (AVODART) capsule 0.5 mg  0.5 mg Oral Daily Judd Gaudier V, MD   0.5 mg at 02/15/21 0901   enoxaparin (LOVENOX) injection 40 mg  40 mg Subcutaneous Q24H Judd Gaudier V, MD   40 mg at 02/15/21 0858   hydrALAZINE (APRESOLINE) injection 10 mg  10 mg Intravenous Q6H PRN Enzo Bi, MD   10 mg at 02/15/21 0856   ondansetron (ZOFRAN) tablet 4 mg  4 mg Oral Q6H PRN Athena Masse, MD       Or   ondansetron [facility]) injection 4 mg  4 mg Intravenous Q6H PRN Athena Masse, MD       QUEtiapine (SEROQUEL) tablet 50 mg  50 mg Oral Daily Judd Gaudier V, MD   50 mg at 02/15/21 0900   senna-docusate (Senokot-S) tablet 1 tablet  1 tablet Oral QHS PRN Athena Masse, MD       traZODone (DESYREL) tablet 200 mg  200 mg Oral QHS Athena Masse, MD       venlafaxine [facility]) tablet 37.5 mg  37.5 mg Oral BID WC Enzo Bi, MD         Discharge Medications: Please see discharge summary for a list of discharge medications.  Relevant Imaging Results:  Relevant Lab Results:   Additional Information SS# [id number]  Shelbie Hutching, RN

## 2021-02-16 ENCOUNTER — Ambulatory Visit: Payer: PPO | Admitting: Urology

## 2021-02-16 LAB — BASIC METABOLIC PANEL
Anion gap: 9 (ref 5–15)
BUN: 18 mg/dL (ref 8–23)
CO2: 22 mmol/L (ref 22–32)
Calcium: 8.8 mg/dL — ABNORMAL LOW (ref 8.9–10.3)
Chloride: 103 mmol/L (ref 98–111)
Creatinine, Ser: 1.3 mg/dL — ABNORMAL HIGH (ref 0.61–1.24)
GFR, Estimated: 56 mL/min — ABNORMAL LOW (ref >60)
Glucose, Bld: 152 mg/dL — ABNORMAL HIGH (ref 70–99)
Potassium: 3.4 mmol/L — ABNORMAL LOW (ref 3.5–5.1)
Sodium: 134 mmol/L — ABNORMAL LOW (ref 135–145)

## 2021-02-16 LAB — CBC
HCT: 40.9 % (ref 39.0–52.0)
Hemoglobin: 14.9 g/dL (ref 13.0–17.0)
MCH: 33.6 pg (ref 26.0–34.0)
MCHC: 36.4 g/dL — ABNORMAL HIGH (ref 30.0–36.0)
MCV: 92.3 fL (ref 80.0–100.0)
Platelets: 182 10*3/uL (ref 150–400)
RBC: 4.43 MIL/uL (ref 4.22–5.81)
RDW: 13.3 % (ref 11.5–15.5)
WBC: 7.3 10*3/uL (ref 4.0–10.5)
nRBC: 0 % (ref 0.0–0.2)

## 2021-02-16 LAB — URINE CULTURE

## 2021-02-16 LAB — MAGNESIUM: Magnesium: 1.9 mg/dL (ref 1.7–2.4)

## 2021-02-16 MED ORDER — FINASTERIDE 5 MG PO TABS
5.0000 mg | ORAL_TABLET | Freq: Every day | ORAL | Status: DC
  Administered 2021-02-17 – 2021-02-20 (×4): 5 mg via ORAL
  Filled 2021-02-16 (×4): qty 1

## 2021-02-16 MED ORDER — HYDRALAZINE HCL 50 MG PO TABS
50.0000 mg | ORAL_TABLET | Freq: Three times a day (TID) | ORAL | Status: DC
  Administered 2021-02-16 – 2021-02-20 (×9): 50 mg via ORAL
  Filled 2021-02-16 (×12): qty 1

## 2021-02-16 MED ORDER — POTASSIUM CHLORIDE 20 MEQ PO PACK
40.0000 meq | PACK | Freq: Once | ORAL | Status: AC
  Administered 2021-02-16: 11:00:00 40 meq via ORAL
  Filled 2021-02-16: qty 2

## 2021-02-16 NOTE — Progress Notes (Signed)
PT Cancellation Note  Patient Details Name: Russell Terry MRN: [id number] DOB: 09/14/1940   Cancelled Treatment:    Reason Eval/Treat Not Completed: Patient not medically ready. Per chart review, BP this AM was 185/120 mmHg. Therapeutic intervention contraindicated with DBP > 110 mmHg. Will follow up with therapy intervention today with time permitting, and as medically appropriate.    Herminio Commons, PT, DPT 8:29 AM,02/16/21

## 2021-02-16 NOTE — Progress Notes (Signed)
[facility]) Hospital Liaison note:  This patient is currently enrolled in [facility] outpatient-based Palliative Care. Will continue to follow for disposition.  Please call with any outpatient palliative questions or concerns.  Thank you, Lorelee Market, LPN [facility] Liaison [phone]

## 2021-02-16 NOTE — Progress Notes (Signed)
To whom it may concern: This patient has a diagnosis of Lewi Body Dementia which supercedes any other mental health diagnosis.

## 2021-02-16 NOTE — Progress Notes (Signed)
Recheck following hydralazine

## 2021-02-16 NOTE — Progress Notes (Signed)
Physical Therapy Treatment Patient Details Name: Russell Terry MRN: [id number] DOB: 08-01-1940 Today's Date: 02/16/2021    History of Present Illness Patient is a  80 y.o. male with medical history significant for Lewy Body dementia, parkinsonian tremor, A. fib, COPD and HTN, BPH, renal cell CA and nephrolithiasis, with history of frequent UTIs  who has been on three different antibiotics over the past month who was brought in for evaluation of unresponsiveness. Found to have acute metabolic encephalopathy secondary to UTI    PT Comments    Pt tolerated treatment well today. Treatment continues to focus on improving activity tolerance, safety with functional mobility, proximal stability, and generalized strengthening. Due to cognition, pt continues to require increased assist for all mobility. However, pt able to improve STS and ambulation assist levels and seated balance since last session. Required mod-max assist +1 for transfers and demonstrated grossly poor seated balance. Able to perform seated therex at EOB without LOB, but required max multimodal cues for sequencing of task. Despite progress, he continues to be limited with meeting goals secondary to cognition, decreased balance, functional weakness, impulsivity, and poor safety awareness. Pt will continue to benefit from skilled acute PT services to address deficits for return to baseline function. Will continue to recommend SNF at DC.    Follow Up Recommendations  SNF     Equipment Recommendations  None recommended by PT    Recommendations for Other Services       Precautions / Restrictions Precautions Precautions: Fall Restrictions Weight Bearing Restrictions: No    Mobility  Bed Mobility Overal bed mobility: Needs Assistance Bed Mobility: Supine to Sit;Sit to Supine     Supine to sit: Max assist Sit to supine: Max assist   General bed mobility comments: assistance for BLE and trunk support. verbal cues for task  initiation and technique    Transfers Overall transfer level: Needs assistance Equipment used: Rolling walker (2 wheeled) Transfers: Sit to/from Stand Sit to Stand: Mod assist;Max assist         General transfer comment: Intermittent assist ranging from mod-max for STS transfers from EOB with RW; max multimodal cues for sequencing/hand placement.  Ambulation/Gait Ambulation/Gait assistance: Max assist;Mod assist Gait Distance (Feet): 4 Feet (3 lateral steps towards HOB x2)         General Gait Details: Intermittent assist ranging from mod-max assist to take a few steps at bedside with RW. Demonstrates poor safety awareness, narrow BOS, decreased step length/foot clearance bil, and poor balance. Required max multimodal cues for balance, sequencing, safety, and RW negotiation.    Balance Overall balance assessment: Needs assistance Sitting-balance support: Feet supported Sitting balance-Leahy Scale: Fair     Standing balance support: Bilateral upper extremity supported Standing balance-Leahy Scale: Poor Standing balance comment: required min-max assist for balance with multiple STS transfers/gait       Cognition Arousal/Alertness: Awake/alert Behavior During Therapy: Flat affect Overall Cognitive Status: History of cognitive impairments - at baseline    General Comments: patient able to follow single step commands intermittently with extra time and multi-modal cueing      Exercises Other Exercises Other Exercises: bed mobility, multiple STS transfers, limited gait with RW. Other Exercises: Pt able to perform multiple seated exercises at EOB including: marching, LAQ, reaching outside BOS, and perturbations. Required mod-max assist for BLE therex due to cognition. Other Exercises: Pt and spouse educated regarding: PT role/POC, DC recommendations, safety with mobility.    General Comments General comments (skin integrity, edema, etc.): persistent runny  nose throughout  session; RN notified      Pertinent Vitals/Pain Pain Assessment: No/denies pain     PT Goals (current goals can now be found in the care plan section) Acute Rehab PT Goals Patient Stated Goal: patient unable to participate with goal setting PT Goal Formulation: With family Time For Goal Achievement: 03/01/21 Potential to Achieve Goals: Fair Progress towards PT goals: Progressing toward goals    Frequency    Min 2X/week      PT Plan Current plan remains appropriate       AM-PAC PT "6 Clicks" Mobility   Outcome Measure  Help needed turning from your back to your side while in a flat bed without using bedrails?: A Lot Help needed moving from lying on your back to sitting on the side of a flat bed without using bedrails?: A Lot Help needed moving to and from a bed to a chair (including a wheelchair)?: A Lot Help needed standing up from a chair using your arms (e.g., wheelchair or bedside chair)?: A Lot Help needed to walk in hospital room?: Total Help needed climbing 3-5 steps with a railing? : Total 6 Click Score: 10    End of Session Equipment Utilized During Treatment: Gait belt Activity Tolerance: Patient limited by lethargy;Patient tolerated treatment well Patient left: in bed;with call bell/phone within reach;with bed alarm set;with family/visitor present Nurse Communication: Mobility status PT Visit Diagnosis: Unsteadiness on feet (R26.81);Muscle weakness (generalized) (M62.81)     Time: [id number] PT Time Calculation (min) (ACUTE ONLY): 28 min  Charges:  $Therapeutic Exercise: 8-22 mins $Therapeutic Activity: 8-22 mins                       Herminio Commons, PT, DPT 11:58 AM,02/16/21

## 2021-02-16 NOTE — Progress Notes (Signed)
To whom it may concern: Patient requires a short term nursing home stay- 30 days or less for strengthening and rehabilitation.  Plan is for return home.

## 2021-02-16 NOTE — TOC Progression Note (Signed)
Transition of Care [facility]) - Progression Note    Patient Details  Name: Russell Terry MRN: [id number] Date of Birth: Dec 18, 1940  Transition of Care [facility]) CM/SW Contact  Shelbie Hutching, RN Phone Number: 02/16/2021, 4:30 PM  Clinical Narrative:    Patient's wife accepts bed offer from [facility] and rehab.  RNCM has called [employer] and started insurance authorization for SNF and EMS transport to SNF.  [facility] does not accept new patient's over the weekend so patient could discharge on Monday.    Expected Discharge Plan: [place] Barriers to Discharge: Continued Medical Work up  Expected Discharge Plan and Services Expected Discharge Plan: [place]   Discharge Planning Services: CM Consult Post Acute Care Choice: [place] Living arrangements for the past 2 months: Single Family Home                 DME Arranged: N/A DME Agency: NA       HH Arranged: NA           Social Determinants of Health (SDOH) Interventions    Readmission Risk Interventions Readmission Risk Prevention Plan 01/08/2019  Post Dischage Appt Complete  Medication Screening Complete  Transportation Screening Complete  Some recent data might be hidden

## 2021-02-16 NOTE — Progress Notes (Signed)
PROGRESS NOTE    Russell Terry  [id number] DOB: 1940/08/27 DOA: 02/14/2021 PCP: [place] Pink, MD  111A/111A-AA   Assessment & Plan:   Principal Problem:   Acute metabolic encephalopathy Active Problems:   Alzheimer's dementia without behavioral disturbance ([name])   Atrial fibrillation ([place])   COPD (chronic obstructive pulmonary disease) ([place])   Essential hypertension   Nephrolithiasis   Recurrent UTI   History of renal cell carcinoma   Russell Terry is a 80 y.o. male with medical history significant for Lewy Body dementia, parkinsonian tremor on Sinemet, A. fib, COPD and HTN, BPH, renal cell CA and nephrolithiasis, with history of frequent UTIs  who has been on three different antibiotics over the past month who was brought in for evaluation of unresponsiveness.   Acute metabolic encephalopathy possible 2/2 dehydration and/or home sedating meds - Patient with several hours of decreased responsiveness though no acute distress - Head CT with no acute findings --noted to have reduced oral intake.  Pt more alert the next day after IVF.  AMS less likely due to UTI since pt was just recently properly treated with abx. --also takes many sedatives at home Plan: --encourage oral hydration --hold home trazodone   UTI, ruled out --per wife, pt had been treated for UTI 3-4 times recently based on urine cx results.   --started on ceftriaxone --urine cx not obtained on admission, but added-on later to original collection.  Grew multiples species, suggesting chronic colonization. Plan: --d/c ceftriaxone.    Nephrolithiasis history of renal cell CA s/p nephrectomy   BPH --cont home Avodart as [name]  Lewy body dementia without behavioral disturbance ([name]) - on home Exelon, Seroquel, trazodone and Effexor and Aricept  --had rapid decline recently Plan: --cont home regimen except for trazodone to reduce amount of sedatives  Parkinson's disease - Continue  Sinemet  Poor oral intake --SLP eval found no problem with swallowing.  Pt just refuses to take in orals sometimes.     Atrial fibrillation ([place]) - Not currently on anticoagulation --cont dilt as short-acting     COPD (chronic obstructive pulmonary disease) ([place]) - Not acutely exacerbated.  Albuterol as needed     Essential hypertension - BP to 190's, pt had missed several days of BP meds due to refusal to swallow pills --cont dilt as short-acting --cont oral hydralazine 50 mg q8h (new) --IV hydra PRN   DVT prophylaxis: Lovenox SQ Code Status: DNR  Family Communication: wife updated at bedside today Level of care: Med-Surg Dispo:   The patient is from: home Anticipated d/c is to: SNF Anticipated d/c date is: whenever bed available Patient currently is medically ready to d/c.   Subjective and Interval History:  More alert and more words today.  Drank fluids.    Objective: Vitals:   02/16/21 0749 02/16/21 0800 02/16/21 1115 02/16/21 1549  BP: (!) 185/120 (!) 170/101 (!) 170/104 107/73  Pulse: 89 100 96 89  Resp: '18  16 16  '$ Temp: 98.3 F (36.8 C)  98.4 F (36.9 C) 98.1 F (36.7 C)  TempSrc:      SpO2: 97%  97% (!) 80%  Weight:      Height:        Intake/Output Summary (Last 24 hours) at 02/16/2021 1642 Last data filed at 02/16/2021 0421 Gross per 24 hour  Intake 125 ml  Output 650 ml  Net -525 ml   Filed Weights   02/14/21 1851 02/15/21 0307  Weight: 82 kg 78.6 kg  Examination:   Constitutional: NAD, alert, not oriented HEENT: conjunctivae and lids normal, EOMI CV: No cyanosis.   RESP: normal respiratory effort, on RA Extremities: No effusions, edema in BLE SKIN: warm, dry   Data Reviewed: I have personally reviewed following labs and imaging studies  CBC: Recent Labs  Lab 02/14/21 1913 02/15/21 0029 02/16/21 0450  WBC 6.1 5.5 7.3  NEUTROABS 3.5  --   --   HGB 15.4 13.8 14.9  HCT 44.5 39.7 40.9  MCV 94.1 92.5 92.3  PLT 191 189 [id number]    Basic Metabolic Panel: Recent Labs  Lab 02/14/21 1913 02/15/21 0029 02/16/21 0450  NA 135  --  134*  K 4.4  --  3.4*  CL 102  --  103  CO2 26  --  22  GLUCOSE 104*  --  152*  BUN 22  --  18  CREATININE 1.62* 1.57* 1.30*  CALCIUM 9.0  --  8.8*  MG  --  1.9 1.9  PHOS  --  2.5  --    GFR: Estimated Creatinine Clearance: 46.8 mL/min (A) (by C-G formula based on SCr of 1.3 mg/dL (H)). Liver Function Tests: Recent Labs  Lab 02/14/21 1913  AST 20  ALT 11  ALKPHOS 59  BILITOT 1.3*  PROT 7.1  ALBUMIN 4.3   No results for input(s): LIPASE, AMYLASE in the last 168 hours. No results for input(s): AMMONIA in the last 168 hours. Coagulation Profile: No results for input(s): INR, PROTIME in the last 168 hours. Cardiac Enzymes: No results for input(s): CKTOTAL, CKMB, CKMBINDEX, TROPONINI in the last 168 hours. BNP (last 3 results) No results for input(s): PROBNP in the last 8760 hours. HbA1C: No results for input(s): HGBA1C in the last 72 hours. CBG: No results for input(s): GLUCAP in the last 168 hours. Lipid Profile: No results for input(s): CHOL, HDL, LDLCALC, TRIG, CHOLHDL, LDLDIRECT in the last 72 hours. Thyroid Function Tests: No results for input(s): TSH, T4TOTAL, FREET4, T3FREE, THYROIDAB in the last 72 hours. Anemia Panel: No results for input(s): VITAMINB12, FOLATE, FERRITIN, TIBC, IRON, RETICCTPCT in the last 72 hours. Sepsis Labs: No results for input(s): PROCALCITON, LATICACIDVEN in the last 168 hours.  Recent Results (from the past 240 hour(s))  Urine Culture     Status: Abnormal   Collection Time: 02/14/21 10:12 PM   Specimen: Urine, Random  Result Value Ref Range Status   Specimen Description   Final    URINE, RANDOM Performed at [facility], [address] [place], [place] [address]    Special Requests   Final    NONE Performed at [facility], [address] [place], [place] [address]    Culture MULTIPLE SPECIES PRESENT,  SUGGEST RECOLLECTION (A)  Final   Report Status 02/16/2021 FINAL  Final  Resp Panel by RT-PCR (Flu A&B, Covid)     Status: None   Collection Time: 02/15/21 12:29 AM  Result Value Ref Range Status   SARS Coronavirus 2 by RT PCR NEGATIVE NEGATIVE Final    Comment: (NOTE) SARS-CoV-2 target nucleic acids are NOT DETECTED.  The SARS-CoV-2 RNA is generally detectable in upper respiratory specimens during the acute phase of infection. The lowest concentration of SARS-CoV-2 viral copies this assay can detect is 138 copies/mL. A negative result does not preclude SARS-Cov-2 infection and should not be used as the sole basis for treatment or other patient management decisions. A negative result may occur with  improper specimen collection/handling, submission of specimen other than  nasopharyngeal swab, presence of viral mutation(s) within the areas targeted by this assay, and inadequate number of viral copies(<138 copies/mL). A negative result must be combined with clinical observations, patient history, and epidemiological information. The expected result is Negative.  Fact Sheet for Patients:  EntrepreneurPulse.com.au  Fact Sheet for Healthcare Providers:  IncredibleEmployment.be  This test is no t yet approved or cleared by the [place] FDA and  has been authorized for detection and/or diagnosis of SARS-CoV-2 by FDA under an Emergency Use Authorization (EUA). This EUA will remain  in effect (meaning this test can be used) for the duration of the COVID-19 declaration under Section 564(b)(1) of the Act, 21 U.S.C.section 360bbb-3(b)(1), unless the authorization is terminated  or revoked sooner.       Influenza A by PCR NEGATIVE NEGATIVE Final   Influenza B by PCR NEGATIVE NEGATIVE Final    Comment: (NOTE) The Xpert Xpress SARS-CoV-2/FLU/RSV plus assay is intended as an aid in the diagnosis of influenza from Nasopharyngeal swab specimens  and should not be used as a sole basis for treatment. Nasal washings and aspirates are unacceptable for Xpert Xpress SARS-CoV-2/FLU/RSV testing.  Fact Sheet for Patients: EntrepreneurPulse.com.au  Fact Sheet for Healthcare Providers: IncredibleEmployment.be  This test is not yet approved or cleared by the [place] FDA and has been authorized for detection and/or diagnosis of SARS-CoV-2 by FDA under an Emergency Use Authorization (EUA). This EUA will remain in effect (meaning this test can be used) for the duration of the COVID-19 declaration under Section 564(b)(1) of the Act, 21 U.S.C. section 360bbb-3(b)(1), unless the authorization is terminated or revoked.  Performed at [facility], [address] [place], [place] [address]       Radiology Studies: CT HEAD WO CONTRAST (5MM)  Result Date: 02/14/2021 CLINICAL DATA:  Dizziness EXAM: CT HEAD WITHOUT CONTRAST TECHNIQUE: Contiguous axial images were obtained from the base of the skull through the vertex without intravenous contrast. COMPARISON:  12/02/2008 FINDINGS: Brain: There is no mass, hemorrhage or extra-axial collection. There is generalized atrophy without lobar predilection. Hypodensity of the white matter is most commonly associated with chronic microvascular disease. Vascular: No abnormal hyperdensity of the major intracranial arteries or dural venous sinuses. No intracranial atherosclerosis. Skull: Old right frontal craniotomy. Sinuses/Orbits: No fluid levels or advanced mucosal thickening of the visualized paranasal sinuses. No mastoid or middle ear effusion. The orbits are normal. IMPRESSION: Chronic microvascular ischemia and generalized atrophy without acute intracranial abnormality. Electronically Signed   By: Ulyses Jarred M.D.   On: 02/14/2021 21:52   DG Chest Portable 1 View  Result Date: 02/14/2021 CLINICAL DATA:  Syncopal episode EXAM: PORTABLE CHEST 1 VIEW  COMPARISON:  01/05/2019 FINDINGS: The heart size and mediastinal contours are within normal limits. Aortic atherosclerosis. Both lungs are clear. The visualized skeletal structures are unremarkable. IMPRESSION: No active disease. Electronically Signed   By: Donavan Foil M.D.   On: 02/14/2021 22:02     Scheduled Meds:  carbidopa-levodopa  2 tablet Oral QID   diltiazem  60 mg Oral QID   donepezil  10 mg Oral QHS   enoxaparin (LOVENOX) injection  40 mg Subcutaneous Q24H   [START ON 02/17/2021] finasteride  5 mg Oral Daily   hydrALAZINE  50 mg Oral Q8H   QUEtiapine  50 mg Oral Daily   venlafaxine  37.5 mg Oral BID WC   Continuous Infusions:     LOS: 1 day     Enzo Bi, MD [facility] If 7PM-7AM, please  contact night-coverage 02/16/2021, 4:42 PM

## 2021-02-17 LAB — BASIC METABOLIC PANEL
Anion gap: 5 (ref 5–15)
BUN: 22 mg/dL (ref 8–23)
CO2: 22 mmol/L (ref 22–32)
Calcium: 8.8 mg/dL — ABNORMAL LOW (ref 8.9–10.3)
Chloride: 108 mmol/L (ref 98–111)
Creatinine, Ser: 1.57 mg/dL — ABNORMAL HIGH (ref 0.61–1.24)
GFR, Estimated: 44 mL/min — ABNORMAL LOW (ref >60)
Glucose, Bld: 103 mg/dL — ABNORMAL HIGH (ref 70–99)
Potassium: 3.5 mmol/L (ref 3.5–5.1)
Sodium: 135 mmol/L (ref 135–145)

## 2021-02-17 LAB — CBC
HCT: 38.9 % — ABNORMAL LOW (ref 39.0–52.0)
Hemoglobin: 13.9 g/dL (ref 13.0–17.0)
MCH: 32.6 pg (ref 26.0–34.0)
MCHC: 35.7 g/dL (ref 30.0–36.0)
MCV: 91.3 fL (ref 80.0–100.0)
Platelets: 200 10*3/uL (ref 150–400)
RBC: 4.26 MIL/uL (ref 4.22–5.81)
RDW: 13.7 % (ref 11.5–15.5)
WBC: 10.6 10*3/uL — ABNORMAL HIGH (ref 4.0–10.5)
nRBC: 0 % (ref 0.0–0.2)

## 2021-02-17 LAB — MAGNESIUM: Magnesium: 1.8 mg/dL (ref 1.7–2.4)

## 2021-02-17 MED ORDER — ENSURE ENLIVE PO LIQD
237.0000 mL | Freq: Three times a day (TID) | ORAL | Status: DC
  Administered 2021-02-17 – 2021-02-19 (×6): 237 mL via ORAL

## 2021-02-17 MED ORDER — SODIUM CHLORIDE 0.9 % IV SOLN: INTRAVENOUS | Status: AC

## 2021-02-17 MED ORDER — POLYETHYLENE GLYCOL 3350 17 G PO PACK
34.0000 g | PACK | ORAL | Status: AC
  Administered 2021-02-17 (×2): 34 g via ORAL
  Filled 2021-02-17 (×2): qty 2

## 2021-02-17 MED ORDER — QUETIAPINE FUMARATE 25 MG PO TABS
25.0000 mg | ORAL_TABLET | Freq: Every day | ORAL | Status: DC
  Administered 2021-02-19: 25 mg via ORAL
  Filled 2021-02-17 (×2): qty 1

## 2021-02-17 MED ORDER — BISACODYL 10 MG RE SUPP
10.0000 mg | Freq: Once | RECTAL | Status: AC
  Administered 2021-02-17: 18:00:00 10 mg via RECTAL
  Filled 2021-02-17: qty 1

## 2021-02-17 NOTE — Progress Notes (Addendum)
PROGRESS NOTE    Russell Terry  [id number] DOB: [birth year] DOA: 02/14/2021 PCP: [place] Pink, MD  111A/111A-AA   Assessment & Plan:   Principal Problem:   Acute metabolic encephalopathy Active Problems:   Alzheimer's dementia without behavioral disturbance ([name])   Atrial fibrillation ([place])   COPD (chronic obstructive pulmonary disease) ([place])   Essential hypertension   Nephrolithiasis   Recurrent UTI   History of renal cell carcinoma   Russell Terry is a 80 y.o. male with medical history significant for Lewy Body dementia, parkinsonian tremor on Sinemet, A. fib, COPD and HTN, BPH, renal cell CA and nephrolithiasis, with history of frequent UTIs  who has been on three different antibiotics over the past month who was brought in for evaluation of unresponsiveness.   Acute metabolic encephalopathy possible 2/2 dehydration and/or home sedating meds - Patient with several hours of decreased responsiveness though no acute distress - Head CT with no acute findings --noted to have reduced oral intake.  Pt more alert the next day after IVF.  AMS less likely due to UTI since pt was just recently properly treated with abx. --also takes many sedatives at home Plan: --cont NS'@100'$  for 10 hours, since still having poor oral hydration --hold home trazodone  --reduce seroquel to 25 mg, and scheduled for nightly  UTI, ruled out --per wife, pt had been treated for UTI 3-4 times recently based on urine cx results.   --started on ceftriaxone --urine cx not obtained on admission, but added-on later to original collection.  Grew multiples species, suggesting chronic colonization. --ceftriaxone d/c'ed. --Advised wife not to treat for UTI unless symptomatic.  Nephrolithiasis history of renal cell CA s/p nephrectomy   BPH --cont home Avodart as [name]  Lewy body dementia without behavioral disturbance ([name]) - on home Exelon, Seroquel, trazodone and Effexor and Aricept  --had  rapid decline recently Plan: --hold trazodone --reduce seroquel to 25 mg, and scheduled for nightly  Parkinson's disease - Continue Sinemet  Poor oral intake --SLP eval found no problem with swallowing.  Pt just refuses to take in orals sometimes. --palliative consult      Atrial fibrillation ([place]) - Not currently on anticoagulation --cont dilt as short-acting     COPD (chronic obstructive pulmonary disease) ([place]) - Not acutely exacerbated.  Albuterol as needed     Essential hypertension - BP to 190's, pt had missed several days of BP meds due to refusal to swallow pills --cont dilt as short-acting --cont oral hydralazine 50 mg q8h (new) --IV hydra PRN   DVT prophylaxis: Lovenox SQ Code Status: DNR  Family Communication:  Level of care: Med-Surg Dispo:   The patient is from: home Anticipated d/c is to: undetermined.  Insurance declined SNF rehab.  Did not do peer-to-peer because denial was not unexpected since pt is not rehab-dable, and needs custodial care.  Anticipated d/c date is: undetermined  Patient currently is not medically ready to d/c, due to poor oral intake and somnolence.    Subjective and Interval History:  Pt more somnolent today.    Insurance denied SNF rehab.  Did not do peer-to-peer because denial was not unexpected since pt is not rehab-dable, and needs custodial care.   Objective: Vitals:   02/16/21 2324 02/17/21 0412 02/17/21 0735 02/17/21 1121  BP: 130/74 (!) 144/88 (!) 149/86 126/79  Pulse: 64 72 72 61  Resp: '18 18 19 19  '$ Temp: 98.1 F (36.7 C) 99 F (37.2 C) 98.8 F (37.1 C) 98.3 F (36.8  C)  TempSrc:   Oral Oral  SpO2: 97% 96% 97% 97%  Weight:      Height:        Intake/Output Summary (Last 24 hours) at 02/17/2021 1537 Last data filed at 02/17/2021 1300 Gross per 24 hour  Intake 0 ml  Output 650 ml  Net -650 ml   Filed Weights   02/14/21 1851 02/15/21 0307  Weight: 82 kg 78.6 kg    Examination:   Constitutional: NAD,  sleeping CV: No cyanosis.   RESP: normal respiratory effort, on RA Extremities: No effusions, edema in BLE SKIN: warm, dry   Data Reviewed: I have personally reviewed following labs and imaging studies  CBC: Recent Labs  Lab 02/14/21 1913 02/15/21 0029 02/16/21 0450 02/17/21 0455  WBC 6.1 5.5 7.3 10.6*  NEUTROABS 3.5  --   --   --   HGB 15.4 13.8 14.9 13.9  HCT 44.5 39.7 40.9 38.9*  MCV 94.1 92.5 92.3 91.3  PLT 191 189 182 [id number]   Basic Metabolic Panel: Recent Labs  Lab 02/14/21 1913 02/15/21 0029 02/16/21 0450 02/17/21 0455  NA 135  --  134* 135  K 4.4  --  3.4* 3.5  CL 102  --  103 108  CO2 26  --  22 22  GLUCOSE 104*  --  152* 103*  BUN 22  --  18 22  CREATININE 1.62* 1.57* 1.30* 1.57*  CALCIUM 9.0  --  8.8* 8.8*  MG  --  1.9 1.9 1.8  PHOS  --  2.5  --   --    GFR: Estimated Creatinine Clearance: 38.7 mL/min (A) (by C-G formula based on SCr of 1.57 mg/dL (H)). Liver Function Tests: Recent Labs  Lab 02/14/21 1913  AST 20  ALT 11  ALKPHOS 59  BILITOT 1.3*  PROT 7.1  ALBUMIN 4.3   No results for input(s): LIPASE, AMYLASE in the last 168 hours. No results for input(s): AMMONIA in the last 168 hours. Coagulation Profile: No results for input(s): INR, PROTIME in the last 168 hours. Cardiac Enzymes: No results for input(s): CKTOTAL, CKMB, CKMBINDEX, TROPONINI in the last 168 hours. BNP (last 3 results) No results for input(s): PROBNP in the last 8760 hours. HbA1C: No results for input(s): HGBA1C in the last 72 hours. CBG: No results for input(s): GLUCAP in the last 168 hours. Lipid Profile: No results for input(s): CHOL, HDL, LDLCALC, TRIG, CHOLHDL, LDLDIRECT in the last 72 hours. Thyroid Function Tests: No results for input(s): TSH, T4TOTAL, FREET4, T3FREE, THYROIDAB in the last 72 hours. Anemia Panel: No results for input(s): VITAMINB12, FOLATE, FERRITIN, TIBC, IRON, RETICCTPCT in the last 72 hours. Sepsis Labs: No results for input(s):  PROCALCITON, LATICACIDVEN in the last 168 hours.  Recent Results (from the past 240 hour(s))  Urine Culture     Status: Abnormal   Collection Time: 02/14/21 10:12 PM   Specimen: Urine, Random  Result Value Ref Range Status   Specimen Description   Final    URINE, RANDOM Performed at [facility], [address], [place], [place] [address]    Special Requests   Final    NONE Performed at [facility], [address] [place], [place] [address]    Culture MULTIPLE SPECIES PRESENT, SUGGEST RECOLLECTION (A)  Final   Report Status 02/16/2021 FINAL  Final  Resp Panel by RT-PCR (Flu A&B, Covid)     Status: None   Collection Time: 02/15/21 12:29 AM  Result Value Ref Range Status  SARS Coronavirus 2 by RT PCR NEGATIVE NEGATIVE Final    Comment: (NOTE) SARS-CoV-2 target nucleic acids are NOT DETECTED.  The SARS-CoV-2 RNA is generally detectable in upper respiratory specimens during the acute phase of infection. The lowest concentration of SARS-CoV-2 viral copies this assay can detect is 138 copies/mL. A negative result does not preclude SARS-Cov-2 infection and should not be used as the sole basis for treatment or other patient management decisions. A negative result may occur with  improper specimen collection/handling, submission of specimen other than nasopharyngeal swab, presence of viral mutation(s) within the areas targeted by this assay, and inadequate number of viral copies(<138 copies/mL). A negative result must be combined with clinical observations, patient history, and epidemiological information. The expected result is Negative.  Fact Sheet for Patients:  EntrepreneurPulse.com.au  Fact Sheet for Healthcare Providers:  IncredibleEmployment.be  This test is no t yet approved or cleared by the [place] FDA and  has been authorized for detection and/or diagnosis of SARS-CoV-2 by FDA under an Emergency Use  Authorization (EUA). This EUA will remain  in effect (meaning this test can be used) for the duration of the COVID-19 declaration under Section 564(b)(1) of the Act, 21 U.S.C.section 360bbb-3(b)(1), unless the authorization is terminated  or revoked sooner.       Influenza A by PCR NEGATIVE NEGATIVE Final   Influenza B by PCR NEGATIVE NEGATIVE Final    Comment: (NOTE) The Xpert Xpress SARS-CoV-2/FLU/RSV plus assay is intended as an aid in the diagnosis of influenza from Nasopharyngeal swab specimens and should not be used as a sole basis for treatment. Nasal washings and aspirates are unacceptable for Xpert Xpress SARS-CoV-2/FLU/RSV testing.  Fact Sheet for Patients: EntrepreneurPulse.com.au  Fact Sheet for Healthcare Providers: IncredibleEmployment.be  This test is not yet approved or cleared by the [place] FDA and has been authorized for detection and/or diagnosis of SARS-CoV-2 by FDA under an Emergency Use Authorization (EUA). This EUA will remain in effect (meaning this test can be used) for the duration of the COVID-19 declaration under Section 564(b)(1) of the Act, 21 U.S.C. section 360bbb-3(b)(1), unless the authorization is terminated or revoked.  Performed at [facility], [address], [place], [place] [address]       Radiology Studies: No results found.   Scheduled Meds:  bisacodyl  10 mg Rectal Once   carbidopa-levodopa  2 tablet Oral QID   diltiazem  60 mg Oral QID   donepezil  10 mg Oral QHS   enoxaparin (LOVENOX) injection  40 mg Subcutaneous Q24H   feeding supplement  237 mL Oral TID BM   finasteride  5 mg Oral Daily   hydrALAZINE  50 mg Oral Q8H   polyethylene glycol  34 g Oral Q2H   QUEtiapine  50 mg Oral Daily   venlafaxine  37.5 mg Oral BID WC   Continuous Infusions:  sodium chloride 100 mL/hr at 02/17/21 1123      LOS: 2 days     Enzo Bi, MD [facility] If 7PM-7AM, please  contact night-coverage 02/17/2021, 3:37 PM

## 2021-02-17 NOTE — TOC Progression Note (Addendum)
Transition of Care [facility]) - Progression Note    Patient Details  Name: Russell Terry MRN: [id number] Date of Birth: Aug 02, 1940  Transition of Care [facility]) CM/SW Parker, LCSW Phone Number: 02/17/2021, 1:33 PM  Clinical Narrative:   Notified by Colletta Maryland with [employer]. [facility] was approved. SNF auth denied, requesting peer to peer. Notified Dr. Billie Ruddy.  Peer to peer - due by tomorrow 10 am  Dr. Amalia Hailey - [phone] ` 2:08- Per MD she will not be completing peer to peer. CSW updated Colletta Maryland with [employer]. She stated she will be faxing denial letter and patient/wife will need to be notified of their appeal rights.  3:25- Denial letter received and asked RN to leave at bedside for patient's wife per [employer]TA Representative Stephanie's request. Colletta Maryland stated she cannot call to explain this to the wife. She said wife would need to call the number on the paper if she wants to appeal. Per MD, will have palliative NP assess on Monday. Called patient's wife and informed her of denial and she verbalized understanding. She may appeal.   Expected Discharge Plan: Skilled Nursing Facility Barriers to Discharge: Continued Medical Work up  Expected Discharge Plan and Services Expected Discharge Plan: [place]   Discharge Planning Services: CM Consult Post Acute Care Choice: [place] Living arrangements for the past 2 months: Single Family Home                 DME Arranged: N/A DME Agency: NA       HH Arranged: NA           Social Determinants of Health (SDOH) Interventions    Readmission Risk Interventions Readmission Risk Prevention Plan 01/08/2019  Post Dischage Appt Complete  Medication Screening Complete  Transportation Screening Complete  Some recent data might be hidden

## 2021-02-18 LAB — CBC
HCT: 36.7 % — ABNORMAL LOW (ref 39.0–52.0)
Hemoglobin: 13.3 g/dL (ref 13.0–17.0)
MCH: 33.8 pg (ref 26.0–34.0)
MCHC: 36.2 g/dL — ABNORMAL HIGH (ref 30.0–36.0)
MCV: 93.4 fL (ref 80.0–100.0)
Platelets: 192 10*3/uL (ref 150–400)
RBC: 3.93 MIL/uL — ABNORMAL LOW (ref 4.22–5.81)
RDW: 14 % (ref 11.5–15.5)
WBC: 6.2 10*3/uL (ref 4.0–10.5)
nRBC: 0 % (ref 0.0–0.2)

## 2021-02-18 LAB — BASIC METABOLIC PANEL
Anion gap: 2 — ABNORMAL LOW (ref 5–15)
BUN: 21 mg/dL (ref 8–23)
CO2: 22 mmol/L (ref 22–32)
Calcium: 8.4 mg/dL — ABNORMAL LOW (ref 8.9–10.3)
Chloride: 109 mmol/L (ref 98–111)
Creatinine, Ser: 1.26 mg/dL — ABNORMAL HIGH (ref 0.61–1.24)
GFR, Estimated: 58 mL/min — ABNORMAL LOW (ref >60)
Glucose, Bld: 90 mg/dL (ref 70–99)
Potassium: 3.6 mmol/L (ref 3.5–5.1)
Sodium: 133 mmol/L — ABNORMAL LOW (ref 135–145)

## 2021-02-18 LAB — MAGNESIUM: Magnesium: 2.3 mg/dL (ref 1.7–2.4)

## 2021-02-18 MED ORDER — SENNOSIDES-DOCUSATE SODIUM 8.6-50 MG PO TABS
1.0000 | ORAL_TABLET | Freq: Two times a day (BID) | ORAL | Status: DC
  Administered 2021-02-18 – 2021-02-20 (×4): 1 via ORAL
  Filled 2021-02-18 (×5): qty 1

## 2021-02-18 MED ORDER — BISACODYL 10 MG RE SUPP
10.0000 mg | Freq: Once | RECTAL | Status: AC
  Administered 2021-02-18: 10 mg via RECTAL
  Filled 2021-02-18: qty 1

## 2021-02-18 NOTE — Progress Notes (Signed)
PROGRESS NOTE    Russell Terry  [id number] DOB: 1941-04-17 DOA: 02/14/2021 PCP: [place] Pink, MD  111A/111A-AA   Assessment & Plan:   Principal Problem:   Acute metabolic encephalopathy Active Problems:   Alzheimer's dementia without behavioral disturbance ([name])   Atrial fibrillation ([place])   COPD (chronic obstructive pulmonary disease) ([place])   Essential hypertension   Nephrolithiasis   Recurrent UTI   History of renal cell carcinoma   Russell Terry is a 80 y.o. male with medical history significant for Lewy Body dementia, parkinsonian tremor on Sinemet, A. fib, COPD and HTN, BPH, renal cell CA and nephrolithiasis, with history of frequent UTIs  who has been on three different antibiotics over the past month who was brought in for evaluation of unresponsiveness.   Acute metabolic encephalopathy possible 2/2 dehydration and/or home sedating meds - Patient with several hours of decreased responsiveness though no acute distress - Head CT with no acute findings --noted to have reduced oral intake.  Pt more alert the next day after IVF.  AMS less likely due to UTI since pt was just recently properly treated with abx. --also takes many sedatives at home Plan: --hold home trazodone  --cont seroquel at reduced 25 mg nightly --encourage oral hydration  UTI, ruled out --per wife, pt had been treated for UTI 3-4 times recently based on urine cx results.   --started on ceftriaxone --urine cx not obtained on admission, but added-on later to original collection.  Grew multiples species, suggesting chronic colonization. --ceftriaxone d/c'ed. --Advised wife not to treat for UTI unless symptomatic.  Nephrolithiasis history of renal cell CA s/p nephrectomy   BPH --cont home Avodart as [name]  Lewy body dementia without behavioral disturbance ([name]) - on home Exelon, Seroquel, trazodone and Effexor and Aricept  --had rapid decline recently Plan: --hold trazodone --cont  seroquel at reduced 25 mg nightly  Parkinson's disease - Continue Sinemet  Poor oral intake --SLP eval found no problem with swallowing.  Pt just refuses to take in orals sometimes. --palliative consult to see on Monday     Atrial fibrillation ([place]) - Not currently on anticoagulation --cont dilt 60 mg QID     COPD (chronic obstructive pulmonary disease) ([place]) - Not acutely exacerbated.  Albuterol as needed     Essential hypertension - BP to 190's, pt had missed several days of BP meds due to refusal to swallow pills --BP still varied widely Plan: --cont dilt 60 mg QID --cont oral hydralazine 50 mg q8h (new) --IV hydra PRN   DVT prophylaxis: Lovenox SQ Code Status: DNR  Family Communication:  Level of care: Med-Surg Dispo:   The patient is from: home Anticipated d/c is to: undetermined.  Insurance declined SNF rehab.  Did not do peer-to-peer because denial was not unexpected since pt is not rehab-dable, and needs custodial care.  Anticipated d/c date is: undetermined  Patient currently is not medically ready to d/c, due to poor oral intake and somnolence.  Need palliative consult   Subjective and Interval History:  Pt was more alert today, talking in sentences.   Objective: Vitals:   02/18/21 0323 02/18/21 0505 02/18/21 0757 02/18/21 1442  BP: (!) 154/96 139/87 (!) 169/97 (!) 130/97  Pulse: 80  89 85  Resp: '18  16 16  '$ Temp: 98.4 F (36.9 C)  98.7 F (37.1 C) 98.6 F (37 C)  TempSrc:      SpO2: 91%  98% 99%  Weight:      Height:  No intake or output data in the 24 hours ending 02/18/21 1819  Filed Weights   02/14/21 1851 02/15/21 0307  Weight: 82 kg 78.6 kg    Examination:   Constitutional: NAD, alert, oriented to self only HEENT: conjunctivae and lids normal, EOMI CV: No cyanosis.   RESP: normal respiratory effort, on RA Extremities: No effusions, edema in BLE SKIN: warm, dry Neuro: II - XII grossly intact.     Data Reviewed: I have  personally reviewed following labs and imaging studies  CBC: Recent Labs  Lab 02/14/21 1913 02/15/21 0029 02/16/21 0450 02/17/21 0455 02/18/21 0533  WBC 6.1 5.5 7.3 10.6* 6.2  NEUTROABS 3.5  --   --   --   --   HGB 15.4 13.8 14.9 13.9 13.3  HCT 44.5 39.7 40.9 38.9* 36.7*  MCV 94.1 92.5 92.3 91.3 93.4  PLT 191 189 182 200 [id number]   Basic Metabolic Panel: Recent Labs  Lab 02/14/21 1913 02/15/21 0029 02/16/21 0450 02/17/21 0455 02/18/21 0533  NA 135  --  134* 135 133*  K 4.4  --  3.4* 3.5 3.6  CL 102  --  103 108 109  CO2 26  --  '22 22 22  '$ GLUCOSE 104*  --  152* 103* 90  BUN 22  --  '18 22 21  '$ CREATININE 1.62* 1.57* 1.30* 1.57* 1.26*  CALCIUM 9.0  --  8.8* 8.8* 8.4*  MG  --  1.9 1.9 1.8 2.3  PHOS  --  2.5  --   --   --    GFR: Estimated Creatinine Clearance: 48.3 mL/min (A) (by C-G formula based on SCr of 1.26 mg/dL (H)). Liver Function Tests: Recent Labs  Lab 02/14/21 1913  AST 20  ALT 11  ALKPHOS 59  BILITOT 1.3*  PROT 7.1  ALBUMIN 4.3   No results for input(s): LIPASE, AMYLASE in the last 168 hours. No results for input(s): AMMONIA in the last 168 hours. Coagulation Profile: No results for input(s): INR, PROTIME in the last 168 hours. Cardiac Enzymes: No results for input(s): CKTOTAL, CKMB, CKMBINDEX, TROPONINI in the last 168 hours. BNP (last 3 results) No results for input(s): PROBNP in the last 8760 hours. HbA1C: No results for input(s): HGBA1C in the last 72 hours. CBG: No results for input(s): GLUCAP in the last 168 hours. Lipid Profile: No results for input(s): CHOL, HDL, LDLCALC, TRIG, CHOLHDL, LDLDIRECT in the last 72 hours. Thyroid Function Tests: No results for input(s): TSH, T4TOTAL, FREET4, T3FREE, THYROIDAB in the last 72 hours. Anemia Panel: No results for input(s): VITAMINB12, FOLATE, FERRITIN, TIBC, IRON, RETICCTPCT in the last 72 hours. Sepsis Labs: No results for input(s): PROCALCITON, LATICACIDVEN in the last 168 hours.  Recent  Results (from the past 240 hour(s))  Urine Culture     Status: Abnormal   Collection Time: 02/14/21 10:12 PM   Specimen: Urine, Random  Result Value Ref Range Status   Specimen Description   Final    URINE, RANDOM Performed at [facility], [address], [place], [place] [address]    Special Requests   Final    NONE Performed at [facility], [address] [place],  [address]    Culture MULTIPLE SPECIES PRESENT, SUGGEST RECOLLECTION (A)  Final   Report Status 02/16/2021 FINAL  Final  Resp Panel by RT-PCR (Flu A&B, Covid)     Status: None   Collection Time: 02/15/21 12:29 AM  Result Value Ref Range Status   SARS Coronavirus 2  by RT PCR NEGATIVE NEGATIVE Final    Comment: (NOTE) SARS-CoV-2 target nucleic acids are NOT DETECTED.  The SARS-CoV-2 RNA is generally detectable in upper respiratory specimens during the acute phase of infection. The lowest concentration of SARS-CoV-2 viral copies this assay can detect is 138 copies/mL. A negative result does not preclude SARS-Cov-2 infection and should not be used as the sole basis for treatment or other patient management decisions. A negative result may occur with  improper specimen collection/handling, submission of specimen other than nasopharyngeal swab, presence of viral mutation(s) within the areas targeted by this assay, and inadequate number of viral copies(<138 copies/mL). A negative result must be combined with clinical observations, patient history, and epidemiological information. The expected result is Negative.  Fact Sheet for Patients:  EntrepreneurPulse.com.au  Fact Sheet for Healthcare Providers:  IncredibleEmployment.be  This test is no t yet approved or cleared by the [place] FDA and  has been authorized for detection and/or diagnosis of SARS-CoV-2 by FDA under an Emergency Use Authorization (EUA). This EUA will remain  in effect (meaning  this test can be used) for the duration of the COVID-19 declaration under Section 564(b)(1) of the Act, 21 U.S.C.section 360bbb-3(b)(1), unless the authorization is terminated  or revoked sooner.       Influenza A by PCR NEGATIVE NEGATIVE Final   Influenza B by PCR NEGATIVE NEGATIVE Final    Comment: (NOTE) The Xpert Xpress SARS-CoV-2/FLU/RSV plus assay is intended as an aid in the diagnosis of influenza from Nasopharyngeal swab specimens and should not be used as a sole basis for treatment. Nasal washings and aspirates are unacceptable for Xpert Xpress SARS-CoV-2/FLU/RSV testing.  Fact Sheet for Patients: EntrepreneurPulse.com.au  Fact Sheet for Healthcare Providers: IncredibleEmployment.be  This test is not yet approved or cleared by the [place] FDA and has been authorized for detection and/or diagnosis of SARS-CoV-2 by FDA under an Emergency Use Authorization (EUA). This EUA will remain in effect (meaning this test can be used) for the duration of the COVID-19 declaration under Section 564(b)(1) of the Act, 21 U.S.C. section 360bbb-3(b)(1), unless the authorization is terminated or revoked.  Performed at [facility], [address] [place], [place] [address]       Radiology Studies: No results found.   Scheduled Meds:  carbidopa-levodopa  2 tablet Oral QID   diltiazem  60 mg Oral QID   donepezil  10 mg Oral QHS   enoxaparin (LOVENOX) injection  40 mg Subcutaneous Q24H   feeding supplement  237 mL Oral TID BM   finasteride  5 mg Oral Daily   hydrALAZINE  50 mg Oral Q8H   QUEtiapine  25 mg Oral QHS   senna-docusate  1 tablet Oral BID   venlafaxine  37.5 mg Oral BID WC   Continuous Infusions:      LOS: 3 days     Enzo Bi, MD [facility] If 7PM-7AM, please contact night-coverage 02/18/2021, 6:19 PM

## 2021-02-19 ENCOUNTER — Encounter: Payer: Self-pay | Admitting: Hospitalist

## 2021-02-19 DIAGNOSIS — Z7189 Other specified counseling: Secondary | ICD-10-CM

## 2021-02-19 DIAGNOSIS — Z515 Encounter for palliative care: Secondary | ICD-10-CM

## 2021-02-19 DIAGNOSIS — G309 Alzheimer's disease, unspecified: Secondary | ICD-10-CM

## 2021-02-19 DIAGNOSIS — F0280 Dementia in other diseases classified elsewhere without behavioral disturbance: Secondary | ICD-10-CM

## 2021-02-19 LAB — CBC
HCT: 37.8 % — ABNORMAL LOW (ref 39.0–52.0)
Hemoglobin: 13.5 g/dL (ref 13.0–17.0)
MCH: 33.4 pg (ref 26.0–34.0)
MCHC: 35.7 g/dL (ref 30.0–36.0)
MCV: 93.6 fL (ref 80.0–100.0)
Platelets: 204 10*3/uL (ref 150–400)
RBC: 4.04 MIL/uL — ABNORMAL LOW (ref 4.22–5.81)
RDW: 14 % (ref 11.5–15.5)
WBC: 5.5 10*3/uL (ref 4.0–10.5)
nRBC: 0 % (ref 0.0–0.2)

## 2021-02-19 LAB — MAGNESIUM: Magnesium: 2.1 mg/dL (ref 1.7–2.4)

## 2021-02-19 LAB — BASIC METABOLIC PANEL
Anion gap: 9 (ref 5–15)
BUN: 20 mg/dL (ref 8–23)
CO2: 23 mmol/L (ref 22–32)
Calcium: 9 mg/dL (ref 8.9–10.3)
Chloride: 106 mmol/L (ref 98–111)
Creatinine, Ser: 1.32 mg/dL — ABNORMAL HIGH (ref 0.61–1.24)
GFR, Estimated: 55 mL/min — ABNORMAL LOW (ref >60)
Glucose, Bld: 93 mg/dL (ref 70–99)
Potassium: 3.7 mmol/L (ref 3.5–5.1)
Sodium: 138 mmol/L (ref 135–145)

## 2021-02-19 MED ORDER — POLYETHYLENE GLYCOL 3350 17 G PO PACK: 17.0000 g | PACK | ORAL | Status: DC | PRN

## 2021-02-19 MED ORDER — POLYETHYLENE GLYCOL 3350 17 G PO PACK: 34.0000 g | PACK | ORAL | Status: DC

## 2021-02-19 MED ORDER — ENSURE ENLIVE PO LIQD: 237.0000 mL | Freq: Three times a day (TID) | ORAL | 12 refills | Status: AC

## 2021-02-19 MED ORDER — POLYETHYLENE GLYCOL 3350 17 G PO PACK: 17.0000 g | PACK | Freq: Every day | ORAL | 0 refills | Status: AC

## 2021-02-19 MED ORDER — QUETIAPINE FUMARATE 25 MG PO TABS: 25.0000 mg | ORAL_TABLET | Freq: Every day | ORAL | Status: AC

## 2021-02-19 MED ORDER — HYDRALAZINE HCL 50 MG PO TABS: 50.0000 mg | ORAL_TABLET | Freq: Three times a day (TID) | ORAL | 2 refills | Status: AC

## 2021-02-19 NOTE — Consult Note (Signed)
Consultation Note Date: 02/19/2021   Patient Name: Russell Terry  DOB: 09/25/1940  MRN: [id number]  Age / Sex: 80 y.o., male  PCP: [place] Pink, MD Referring Physician: Enzo Bi, MD  Reason for Consultation: Establishing goals of care and Psychosocial/spiritual support  HPI/Patient Profile: 80 y.o. male  with past medical history of Lewy body dementia, Parkinson's tremor on Sinemet, A. fib, COPD, HTN, BPH, renal cell cancer, nephrolithiasis, history of frequent UTIs admitted on 02/14/2021 with acute metabolic encephalopathy related to dehydration.   Clinical Assessment and Goals of Care: I have reviewed medical records including EPIC notes, labs and imaging, assessed the patient and then met at the bedside along with wife, [name], to discuss diagnosis prognosis, GOC, EOL wishes, disposition and options.  I introduced Palliative Medicine as specialized medical care for people living with serious illness. It focuses on providing relief from the symptoms and stress of a serious illness. The goal is to improve quality of life for both the patient and the family.  We discussed a brief life review of the patient.  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  She states that he has had a decline over the last few years, few months in particular with his dementia.  We talked about [name]'s decreased functional status, decreased by mouth intake, decreased ability to recognize her or participate in his care. The natural disease trajectory and expectations at EOL were discussed.  I attempted to elicit values and goals of care important to the patient.  [name] shares her concern allowed for her spouse.  She states that she is trying to manage caring for him, keeping him at the center, but she endorses difficulty in caring for him at home due to his decreased  functional status, poor by mouth intake, frailty.  The difference between aggressive medical intervention and comfort care was considered in light of the patient's goals of care.  [name] states that she would like hospice care for Mr. Chadderdon.  She shares that she is interested in residential hospice with [facility].  They are active with outpatient palliative services.  We talk in detail about what is and is not provided with residential hospice, and the concept of "let nature take its course".    Advanced directives, concepts specific to code status, artifical feeding and hydration, and rehospitalization were considered and discussed.  [name] states that if [name] is not excepted to residential hospice, she could take him home with the benefits of hospice.  We talked about do not rehospitalize, transitioning to residential hospice when he gets sick again.  We talked about a timeframe possibly 2 weeks.  Discussed the importance of continued conversation with family and the medical providers regarding overall plan of care and treatment options, ensuring decisions are within the context of the patient's values and GOCs. Questions and concerns were addressed. The family was encouraged to call with questions or concerns.  PMT will continue to support holistically.  Conference with  attending, bedside nursing staff, transition of care team, hospice liaison related to patient condition, needs, goals of care, disposition.   HCPOA   NEXT OF KIN -wife, Russell Terry.    SUMMARY OF RECOMMENDATIONS   Requesting residential hospice, but not qualified at this point. Home with the benefits of hospice, do not rehospitalize Goal is to transition to residential hospice when appropriate Anticipate this will be in the next few weeks.   Code Status/Advance Care Planning: DNR  Symptom Management:  Per hospitalist, no additional needs at this time.   Palliative Prophylaxis:   Frequent Pain Assessment and Oral Care  Additional Recommendations (Limitations, Scope, Preferences): Home with hospice care, do not rehospitalize, transition to residential hospice care.   Psycho-social/Spiritual:  Desire for further Chaplaincy support:no Additional Recommendations: Caregiving  Support/Resources and Education on Hospice  Prognosis:  < 4 weeks would not ve surprising based on functional decline, poor by mouth intake, chronic illness burden related to dementia.   Discharge Planning:  home with hospice care, transitioning to residential hospice care in next few weeks.        Primary Diagnoses: Present on Admission:  Alzheimer's dementia without behavioral disturbance ([place])  Atrial fibrillation ([place])  COPD (chronic obstructive pulmonary disease) ([place])  Essential hypertension   I have reviewed the medical record, interviewed the patient and family, and examined the patient. The following aspects are pertinent.  Past Medical History:  Diagnosis Date   Atrial fibrillation ([place]) 12/11/2013   BPH (benign prostatic hyperplasia)    Bradycardia 12/11/2013   Calculus of kidney 12/06/2013   Cancer ([place])    rt kidney removed   Chronic kidney disease    stones,rt kidney removed   COPD (chronic obstructive pulmonary disease) ([place]) 12/11/2013   DDD (degenerative disc disease), lumbar 12/13/2013   Delirium    Dementia ([name])    Elevated prostate specific antigen (PSA) 03/03/2013   Encephalopathy acute 07/22/2016   Enlarged prostate with lower urinary tract symptoms (LUTS) 03/03/2013   Essential hypertension 07/22/2016   Hyperlipidemia, unspecified 07/09/2017   Hypertension    Meningioma ([place]) 10/08/2016   Parkinsonian features 10/08/2016   Pleuritic chest pain 05/31/2014   Renal cell carcinoma ([place]) 05/04/2016   Renal cyst, acquired, left 04/22/2015   Social History   Socioeconomic History   Marital status: Married    Spouse name: Not on file   Number of children: Not on  file   Years of education: Not on file   Highest education level: Not on file  Occupational History   Not on file  Tobacco Use   Smoking status: Every Day    Packs/day: 1.50    Years: 65.00    Pack years: 97.50    Types: E-cigarettes, Cigarettes   Smokeless tobacco: Never   Tobacco comments:    currently vapes  Vaping Use   Vaping Use: Never used  Substance and Sexual Activity   Alcohol use: No   Drug use: No   Sexual activity: Yes    Birth control/protection: None  Other Topics Concern   Not on file  Social History Narrative   Not on file   Social Determinants of Health   Financial Resource Strain: Not on file  Food Insecurity: Not on file  Transportation Needs: Not on file  Physical Activity: Not on file  Stress: Not on file  Social Connections: Not on file   Family History  Problem Relation Age of Onset   Alzheimer's disease Mother    Alzheimer's disease Sister  Bladder Cancer Neg Hx    Prolactinoma Neg Hx    Prostate cancer Neg Hx    Scheduled Meds:  carbidopa-levodopa  2 tablet Oral QID   diltiazem  60 mg Oral QID   donepezil  10 mg Oral QHS   enoxaparin (LOVENOX) injection  40 mg Subcutaneous Q24H   feeding supplement  237 mL Oral TID BM   finasteride  5 mg Oral Daily   hydrALAZINE  50 mg Oral Q8H   QUEtiapine  25 mg Oral QHS   senna-docusate  1 tablet Oral BID   venlafaxine  37.5 mg Oral BID WC   Continuous Infusions: PRN Meds:.acetaminophen **OR** acetaminophen, albuterol, hydrALAZINE, ondansetron **OR** ondansetron (ZOFRAN) IV Medications Prior to Admission:  Prior to Admission medications   Medication Sig Start Date End Date Taking? Authorizing Provider  aspirin EC 81 MG tablet Take 81 mg by mouth daily.   Yes [provider]  bisacodyl 5 MG EC tablet Take 5 mg by mouth daily as needed for moderate constipation.   Yes [provider]  carbidopa-levodopa (SINEMET CR) 50-200 MG tablet Take 1 tablet by mouth 4 (four) times  daily.   Yes [provider]  diltiazem (CARDIZEM CD) 240 MG 24 hr capsule Take 240 mg by mouth daily.    Yes [provider]  donepezil (ARICEPT) 10 MG tablet Take 10 mg by mouth at bedtime.   Yes [provider]  dutasteride (AVODART) 0.5 MG capsule Take 0.5 mg by mouth daily.   Yes [provider]  levofloxacin (LEVAQUIN) 250 MG tablet Take 250 mg by mouth in the morning and at bedtime.   Yes [provider]  rOPINIRole (REQUIP) 0.5 MG tablet Take 0.5 mg by mouth in the morning, at noon, in the evening, and at bedtime.   Yes [provider]  traZODone (DESYREL) 100 MG tablet Take 200 mg by mouth at bedtime. 07/04/18  Yes [provider]  venlafaxine XR (EFFEXOR-XR) 75 MG 24 hr capsule Take 75 mg by mouth at bedtime.  04/14/18  Yes [provider]  acetaminophen (TYLENOL) 325 MG tablet Take 2 tablets (650 mg total) by mouth every 6 (six) hours as needed for mild pain (or Fever >/= 101). Patient not taking: Reported on 02/14/2021 04/25/19   Otila Back, MD  albuterol (VENTOLIN HFA) 108 (90 Base) MCG/ACT inhaler Inhale 2 puffs into the lungs every 6 (six) hours as needed for wheezing or shortness of breath.  Patient not taking: No sig reported 01/11/19   [provider]  cetirizine (ZYRTEC) 10 MG tablet Take 10 mg by mouth daily. Patient not taking: Reported on 02/14/2021    [provider]  feeding supplement (ENSURE ENLIVE / ENSURE PLUS) LIQD Take 237 mLs by mouth 3 (three) times daily between meals. 02/19/21   Enzo Bi, MD  hydrALAZINE (APRESOLINE) 50 MG tablet Take 1 tablet (50 mg total) by mouth 3 (three) times daily. Blood pressure medication. 02/19/21 05/20/21  Enzo Bi, MD  Melatonin 5 MG TABS Take 15 mg by mouth at bedtime.  Patient not taking: Reported on 02/14/2021    [provider]  polyethylene glycol (MIRALAX / GLYCOLAX) 17 g packet Take 17 g by mouth daily. 02/19/21   Enzo Bi, MD  QUEtiapine  (SEROQUEL) 25 MG tablet Take 1 tablet (25 mg total) by mouth at bedtime. Reduced from 50 mg, and changed from daily. 02/19/21   Enzo Bi, MD  rivastigmine (EXELON) 1.5 MG capsule Take 1 capsule by  mouth in the morning and at bedtime. Patient not taking: Reported on 02/14/2021 12/19/20 12/19/21  [provider]  clonazePAM (KLONOPIN) 0.5 MG tablet Take by mouth. 03/16/20 05/19/20  [provider]   Allergies  Allergen Reactions   Quinapril Cough   Ambien [Zolpidem Tartrate] Other (See Comments)    Unspecified reaction   Fenofibrate Other (See Comments)    Unknown   Amoxicillin-Pot Clavulanate Nausea And Vomiting   Morphine Nausea Only   Review of Systems  Unable to perform ROS: Dementia   Physical Exam Vitals and nursing note reviewed.  Constitutional:      General: He is not in acute distress.    Appearance: He is not ill-appearing.  HENT:     Head: Normocephalic and atraumatic.     Mouth/Throat:     Mouth: Mucous membranes are moist.  Cardiovascular:     Rate and Rhythm: Normal rate.  Pulmonary:     Effort: Pulmonary effort is normal. No respiratory distress.  Skin:    General: Skin is warm and dry.  Neurological:     Mental Status: He is alert.     Comments: Known dementia  Psychiatric:     Comments: Calm and cooperative     Vital Signs: BP 139/84 (BP Location: Right Arm)   Pulse 78   Temp 98.5 F (36.9 C)   Resp 20   Ht _0  (1.778 m)   Wt 78.6 kg   SpO2 95%   BMI 24.86 kg/m  Pain Scale: PAINAD   Pain Score: Asleep   SpO2: SpO2: 95 % O2 Device:SpO2: 95 % O2 Flow Rate: .   IO: Intake/output summary:  Intake/Output Summary (Last 24 hours) at 02/19/2021 1348 Last data filed at 02/19/2021 1145 Gross per 24 hour  Intake 120 ml  Output 950 ml  Net -830 ml    LBM: Last BM Date:  (unknown) Baseline Weight: Weight: 82 kg Most recent weight: Weight: 78.6 kg     Palliative Assessment/Data:   Flowsheet Rows    Flowsheet Row Most Recent Value   Intake Tab   Referral Department Hospitalist  Unit at Time of Referral Med/Surg Unit  Palliative Care Primary Diagnosis Neurology  Date Notified 02/17/21  Palliative Care Type New Palliative care  Reason for referral Clarify Goals of Care  Date of Admission 02/14/21  Date first seen by Palliative Care 02/19/21  # of days Palliative referral response time 2 Day(s)  # of days IP prior to Palliative referral 3  Clinical Assessment   Palliative Performance Scale Score 40%  Pain Max last 24 hours Not able to report  Pain Min Last 24 hours Not able to report  Dyspnea Max Last 24 Hours Not able to report  Dyspnea Min Last 24 hours Not able to report  Psychosocial & Spiritual Assessment   Palliative Care Outcomes        Time In: 1120 Time Out: 1230 Time Total: 70 minutes  Greater than 50%  of this time was spent counseling and coordinating care related to the above assessment and plan.  Signed by: Drue Novel, NP   Please contact Palliative Medicine Team phone at [phone] for questions and concerns.  For individual provider: See Shea Evans

## 2021-02-19 NOTE — Progress Notes (Addendum)
[facility] Liaison note:  New referral for [facility] hospice home services received from [place]. Chart notes reviewed, Liaison visited at bedside with patient and his wife. Wife was initially interested in the hospice home., however patient doe not at this time meet hospice inpatient criteria of 2 weeks or less. Hospice services at home reviewed with understanding voiced. Mrs. Valletta does agree with hospice services at home. Hospice eligibility confirmed. No DME needs at time of discharge.   Please send signed out of facility DNR with patient at discharge. He will require EMS transport per request of his wife. Hospital care team updated.  Thank you for the opportunity to be involved in the care of this patient.  Flo Shanks BSN, RN, [place] [phone]

## 2021-02-19 NOTE — TOC Progression Note (Signed)
Transition of Care [facility]) - Progression Note    Patient Details  Name: Russell Terry MRN: [id number] Date of Birth: 1940/08/31  Transition of Care [facility]) CM/SW Contact  Shelbie Hutching, RN Phone Number: 02/19/2021, 1:42 PM  Clinical Narrative:    Patient did not qualify for SNF, not rehab appropriate.  Patient's wife agrees with taking the patient home with hospice services through [facility].  Flo Shanks given the referral.  Patient was already open with [facility] OP Palliative.  Plan for discharge home tomorrow with hospice, hopefully they will be able to go out and see patient tomorrow once he gets home.  He does need EMS transport home.     Expected Discharge Plan: Home w Hospice Care Barriers to Discharge: Other (must enter comment) (Wife needs to prepare the home for patient to return- DC in the am)  Expected Discharge Plan and Services Expected Discharge Plan: [place]   Discharge Planning Services: CM Consult Post Acute Care Choice: Hospice Living arrangements for the past 2 months: Single Family Home Expected Discharge Date: 02/19/21               DME Arranged: N/A DME Agency: NA       HH Arranged: NA           Social Determinants of Health (SDOH) Interventions    Readmission Risk Interventions Readmission Risk Prevention Plan 01/08/2019  Post Dischage Appt Complete  Medication Screening Complete  Transportation Screening Complete  Some recent data might be hidden

## 2021-02-19 NOTE — Care Management Important Message (Signed)
Important Message  Patient Details  Name: DOMINIE HALLUM MRN: [id number] Date of Birth: 28-Jun-1941   Medicare Important Message Given:  Other (see comment)  Patient discharging home with Hospice. Out of respect for the patient and family no Important Message from [facility] given.  Juliann Pulse A Maxson Oddo 02/19/2021, 3:21 PM

## 2021-02-19 NOTE — Progress Notes (Signed)
PROGRESS NOTE    Russell Terry  [id number] DOB: 1941/03/16 DOA: 02/14/2021 PCP: [place] Pink, MD  111A/111A-AA   Assessment & Plan:   Principal Problem:   Acute metabolic encephalopathy Active Problems:   Alzheimer's dementia without behavioral disturbance ([name])   Atrial fibrillation ([place])   COPD (chronic obstructive pulmonary disease) ([place])   Essential hypertension   Nephrolithiasis   Recurrent UTI   History of renal cell carcinoma   Russell Terry is a 79 y.o. male with medical history significant for Lewy Body dementia, parkinsonian tremor on Sinemet, A. fib, COPD and HTN, BPH, renal cell CA and nephrolithiasis, with history of frequent UTIs  who has been on three different antibiotics over the past month who was brought in for evaluation of unresponsiveness.   Acute metabolic encephalopathy possible 2/2 dehydration and/or home sedating meds - Patient with several hours of decreased responsiveness though no acute distress - Head CT with no acute findings --noted to have reduced oral intake.  Pt more alert the next day after IVF.  AMS less likely due to UTI since pt was just recently properly treated with abx. --also takes many sedatives at home Plan: --hold home trazodone and Requip  --cont seroquel at reduced 25 mg nightly --encourage oral hydration  UTI, ruled out --per wife, pt had been treated for UTI 3-4 times recently based on urine cx results.   --started on ceftriaxone --urine cx not obtained on admission, but added-on later to original collection.  Grew multiples species, suggesting chronic colonization. --ceftriaxone d/c'ed. --Advised wife not to treat for UTI unless symptomatic.  Nephrolithiasis history of renal cell CA s/p nephrectomy   BPH --cont home Avodart as [name]  Lewy body dementia without behavioral disturbance ([name]) - on home Exelon, Seroquel, trazodone and Effexor and Aricept  --had rapid decline recently Plan: --hold home  trazodone and Requip  --cont seroquel at reduced 25 mg nightly  Parkinson's disease - Continue Sinemet  Poor oral intake --SLP eval found no problem with swallowing.  Pt just refuses to take in orals sometimes. --palliative consult today, pt will go home with hospice     Atrial fibrillation ([place]) - Not currently on anticoagulation --cont dilt 60 mg QID     COPD (chronic obstructive pulmonary disease) ([place]) - Not acutely exacerbated.  Albuterol as needed     Essential hypertension - BP to 190's, pt had missed several days of BP meds due to refusal to swallow pills --BP still varied widely Plan: --cont dilt 60 mg QID --cont oral hydralazine 50 mg q8h (new) --IV hydra PRN  Constipation --No BM after 2 suppositories and Senna.  Pt refused to drink Miralax --Order Miralax 170 g once, to be left in pt's room and added to anything pt drinks.   DVT prophylaxis: Lovenox SQ Code Status: DNR  Family Communication: wife updated at bedside today Level of care: Med-Surg Dispo:   The patient is from: home Anticipated d/c is to: home with hospice.  Insurance declined SNF rehab, which was not unexpected since pt is not rehab-dable, and needs custodial care.  Anticipated d/c date is: tomorrow, after pt has a BM.   Subjective and Interval History:  Pt awake, but didn't speak much today.  No BM yet.  Palliative consult today, wife elected to take pt home with hospice.   Objective: Vitals:   02/19/21 0004 02/19/21 0515 02/19/21 0830 02/19/21 1145  BP: 139/82 (!) 173/100 (!) 180/109 139/84  Pulse: 83 91 97 78  Resp: 16  $'16 18 20  'K$ Temp: 98.2 F (36.8 C) 98.1 F (36.7 C) 98.5 F (36.9 C) 98.5 F (36.9 C)  TempSrc:  Oral    SpO2: 96% 96% 93% 95%  Weight:      Height:        Intake/Output Summary (Last 24 hours) at 02/19/2021 1512 Last data filed at 02/19/2021 1357 Gross per 24 hour  Intake 600 ml  Output 950 ml  Net -350 ml    Filed Weights   02/14/21 1851 02/15/21 0307   Weight: 82 kg 78.6 kg    Examination:   Constitutional: NAD, alert, not oriented HEENT: conjunctivae and lids normal, EOMI CV: No cyanosis.   RESP: normal respiratory effort, on RA Extremities: No effusions, edema in BLE SKIN: warm, dry   Data Reviewed: I have personally reviewed following labs and imaging studies  CBC: Recent Labs  Lab 02/14/21 1913 02/15/21 0029 02/16/21 0450 02/17/21 0455 02/18/21 0533 02/19/21 0440  WBC 6.1 5.5 7.3 10.6* 6.2 5.5  NEUTROABS 3.5  --   --   --   --   --   HGB 15.4 13.8 14.9 13.9 13.3 13.5  HCT 44.5 39.7 40.9 38.9* 36.7* 37.8*  MCV 94.1 92.5 92.3 91.3 93.4 93.6  PLT 191 189 182 200 192 0000000   Basic Metabolic Panel: Recent Labs  Lab 02/14/21 1913 02/15/21 0029 02/16/21 0450 02/17/21 0455 02/18/21 0533 02/19/21 0440  NA 135  --  134* 135 133* 138  K 4.4  --  3.4* 3.5 3.6 3.7  CL 102  --  103 108 109 106  CO2 26  --  '22 22 22 23  '$ GLUCOSE 104*  --  152* 103* 90 93  BUN 22  --  '18 22 21 20  '$ CREATININE 1.62* 1.57* 1.30* 1.57* 1.26* 1.32*  CALCIUM 9.0  --  8.8* 8.8* 8.4* 9.0  MG  --  1.9 1.9 1.8 2.3 2.1  PHOS  --  2.5  --   --   --   --    GFR: Estimated Creatinine Clearance: 46.1 mL/min (A) (by C-G formula based on SCr of 1.32 mg/dL (H)). Liver Function Tests: Recent Labs  Lab 02/14/21 1913  AST 20  ALT 11  ALKPHOS 59  BILITOT 1.3*  PROT 7.1  ALBUMIN 4.3   No results for input(s): LIPASE, AMYLASE in the last 168 hours. No results for input(s): AMMONIA in the last 168 hours. Coagulation Profile: No results for input(s): INR, PROTIME in the last 168 hours. Cardiac Enzymes: No results for input(s): CKTOTAL, CKMB, CKMBINDEX, TROPONINI in the last 168 hours. BNP (last 3 results) No results for input(s): PROBNP in the last 8760 hours. HbA1C: No results for input(s): HGBA1C in the last 72 hours. CBG: No results for input(s): GLUCAP in the last 168 hours. Lipid Profile: No results for input(s): CHOL, HDL, LDLCALC, TRIG,  CHOLHDL, LDLDIRECT in the last 72 hours. Thyroid Function Tests: No results for input(s): TSH, T4TOTAL, FREET4, T3FREE, THYROIDAB in the last 72 hours. Anemia Panel: No results for input(s): VITAMINB12, FOLATE, FERRITIN, TIBC, IRON, RETICCTPCT in the last 72 hours. Sepsis Labs: No results for input(s): PROCALCITON, LATICACIDVEN in the last 168 hours.  Recent Results (from the past 240 hour(s))  Urine Culture     Status: Abnormal   Collection Time: 02/14/21 10:12 PM   Specimen: Urine, Random  Result Value Ref Range Status   Specimen Description   Final    URINE, RANDOM Performed at [facility], [address] [place], [place] [address]    Special Requests   Final    NONE Performed at [facility], [address] [place], [place] [address]    Culture MULTIPLE SPECIES PRESENT, SUGGEST RECOLLECTION (A)  Final   Report Status 02/16/2021 FINAL  Final  Resp Panel by RT-PCR (Flu A&B, Covid)     Status: None   Collection Time: 02/15/21 12:29 AM  Result Value Ref Range Status   SARS Coronavirus 2 by RT PCR NEGATIVE NEGATIVE Final    Comment: (NOTE) SARS-CoV-2 target nucleic acids are NOT DETECTED.  The SARS-CoV-2 RNA is generally detectable in upper respiratory specimens during the acute phase of infection. The lowest concentration of SARS-CoV-2 viral copies this assay can detect is 138 copies/mL. A negative result does not preclude SARS-Cov-2 infection and should not be used as the sole basis for treatment or other patient management decisions. A negative result may occur with  improper specimen collection/handling, submission of specimen other than nasopharyngeal swab, presence of viral mutation(s) within the areas targeted by this assay, and inadequate number of viral copies(<138 copies/mL). A negative result must be combined with clinical observations, patient history, and epidemiological information. The expected result is Negative.  Fact Sheet for  Patients:  EntrepreneurPulse.com.au  Fact Sheet for Healthcare Providers:  IncredibleEmployment.be  This test is no t yet approved or cleared by the [place] FDA and  has been authorized for detection and/or diagnosis of SARS-CoV-2 by FDA under an Emergency Use Authorization (EUA). This EUA will remain  in effect (meaning this test can be used) for the duration of the COVID-19 declaration under Section 564(b)(1) of the Act, 21 U.S.C.section 360bbb-3(b)(1), unless the authorization is terminated  or revoked sooner.       Influenza A by PCR NEGATIVE NEGATIVE Final   Influenza B by PCR NEGATIVE NEGATIVE Final    Comment: (NOTE) The Xpert Xpress SARS-CoV-2/FLU/RSV plus assay is intended as an aid in the diagnosis of influenza from Nasopharyngeal swab specimens and should not be used as a sole basis for treatment. Nasal washings and aspirates are unacceptable for Xpert Xpress SARS-CoV-2/FLU/RSV testing.  Fact Sheet for Patients: EntrepreneurPulse.com.au  Fact Sheet for Healthcare Providers: IncredibleEmployment.be  This test is not yet approved or cleared by the [place] FDA and has been authorized for detection and/or diagnosis of SARS-CoV-2 by FDA under an Emergency Use Authorization (EUA). This EUA will remain in effect (meaning this test can be used) for the duration of the COVID-19 declaration under Section 564(b)(1) of the Act, 21 U.S.C. section 360bbb-3(b)(1), unless the authorization is terminated or revoked.  Performed at [facility], [address] [place], [place] [address]       Radiology Studies: No results found.   Scheduled Meds:  carbidopa-levodopa  2 tablet Oral QID   diltiazem  60 mg Oral QID   donepezil  10 mg Oral QHS   enoxaparin (LOVENOX) injection  40 mg Subcutaneous Q24H   feeding supplement  237 mL Oral TID BM   finasteride  5 mg Oral Daily    hydrALAZINE  50 mg Oral Q8H   QUEtiapine  25 mg Oral QHS   senna-docusate  1 tablet Oral BID   venlafaxine  37.5 mg Oral BID WC   Continuous Infusions:      LOS: 4 days     Enzo Bi, MD [facility] If 7PM-7AM, please contact night-coverage 02/19/2021, 3:12 PM

## 2021-02-19 NOTE — Progress Notes (Signed)
Physical Therapy Treatment Patient Details Name: Russell Terry MRN: [id number] DOB: 1941/06/05 Today's Date: 02/19/2021    History of Present Illness Patient is a  80 y.o. male with medical history significant for Lewy Body dementia, parkinsonian tremor, A. fib, COPD and HTN, BPH, renal cell CA and nephrolithiasis, with history of frequent UTIs  who has been on three different antibiotics over the past month who was brought in for evaluation of unresponsiveness. Found to have acute metabolic encephalopathy secondary to UTI    PT Comments    Pt received supine in bed, agreeable to therapy with wife in room. Midway through session, NP with palliative care entered room and spoke to wife as PT performed mobility with pt. Functional mobility continues to require the same assist levels. STS (5 reps) performed with attempt to maintain standing with each rep. Posterior lean and LOB was demo in standing with MAX A to prevent; posterior lean improved to requiring MIN A at end of session. Side stepping for 29f was performed; pt sat down prior to being cued due to cognitive deficits and limited understanding of activity. Would benefit from skilled PT to address above deficits and promote optimal return to PLOF.   Follow Up Recommendations  SNF     Equipment Recommendations  None recommended by PT    Recommendations for Other Services       Precautions / Restrictions Precautions Precautions: Fall Restrictions Weight Bearing Restrictions: No    Mobility  Bed Mobility Overal bed mobility: Needs Assistance Bed Mobility: Supine to Sit;Sit to Supine     Supine to sit: Max assist Sit to supine: Max assist   General bed mobility comments: assistance for BLE and trunk support. verbal cues for task initiation and technique    Transfers Overall transfer level: Needs assistance Equipment used: Rolling walker (2 wheeled) Transfers: Sit to/from Stand Sit to Stand: Mod assist          General transfer comment: x5 reps. MOD A to assist with lift to stand; max multimodal cues for sequencing and participation  Ambulation/Gait Ambulation/Gait assistance: Mod assist Gait Distance (Feet): 1 Feet Assistive device: Rolling walker (2 wheeled)   Gait velocity: decreased   General Gait Details: MOD A to steady and manage RW during lateral stepping towards HOB. Max VC for sequencing. NP in room provided visual demo of activity.   Stairs             Wheelchair Mobility    Modified Rankin (Stroke Patients Only)       Balance Overall balance assessment: Needs assistance Sitting-balance support: Feet supported Sitting balance-Leahy Scale: Fair Sitting balance - Comments: Improved from poor to fair as session progressed   Standing balance support: Bilateral upper extremity supported Standing balance-Leahy Scale: Poor Standing balance comment: required min-max assist for balance with multiple STS transfers/gait                            Cognition Arousal/Alertness: Awake/alert Behavior During Therapy: Flat affect Overall Cognitive Status: History of cognitive impairments - at baseline                                 General Comments: patient able to follow single step commands intermittently with extra time and multi-modal cueing      Exercises Other Exercises Other Exercises: bed mobility, 5 STS transfers, limited gait with RW. Attempted therex  in standing and sitting - pt unable to maintain attention. NP assisted with repositioning in bed.    General Comments        Pertinent Vitals/Pain Pain Assessment: No/denies pain    Home Living                      Prior Function            PT Goals (current goals can now be found in the care plan section) Acute Rehab PT Goals Patient Stated Goal: patient unable to participate with goal setting PT Goal Formulation: With family Time For Goal Achievement:  03/01/21 Potential to Achieve Goals: Fair    Frequency    Min 2X/week      PT Plan      Co-evaluation              AM-PAC PT "6 Clicks" Mobility   Outcome Measure  Help needed turning from your back to your side while in a flat bed without using bedrails?: A Lot Help needed moving from lying on your back to sitting on the side of a flat bed without using bedrails?: A Lot Help needed moving to and from a bed to a chair (including a wheelchair)?: A Lot Help needed standing up from a chair using your arms (e.g., wheelchair or bedside chair)?: A Lot Help needed to walk in hospital room?: Total Help needed climbing 3-5 steps with a railing? : Total 6 Click Score: 10    End of Session Equipment Utilized During Treatment: Gait belt Activity Tolerance: Patient limited by lethargy;Patient tolerated treatment well Patient left: in bed;with call bell/phone within reach;with bed alarm set;with family/visitor present;with nursing/sitter in room Nurse Communication: Mobility status PT Visit Diagnosis: Unsteadiness on feet (R26.81);Muscle weakness (generalized) (M62.81)     Time: 1000-1017 PT Time Calculation (min) (ACUTE ONLY): 17 min  Charges:  $Therapeutic Activity: 8-22 mins                     Russell Terry PT, DPT 02/19/21 12:45 PM [id number]    Russell Terry 02/19/2021, 12:39 PM

## 2021-02-20 MED ORDER — DILTIAZEM HCL 90 MG PO TABS: 90.0000 mg | ORAL_TABLET | Freq: Three times a day (TID) | ORAL | 2 refills | Status: AC

## 2021-02-20 MED ORDER — POLYETHYLENE GLYCOL 3350 17 G PO PACK: 170.0000 g | PACK | Freq: Every day | ORAL | Status: DC | PRN

## 2021-02-20 MED ORDER — BISACODYL 10 MG RE SUPP: 10.0000 mg | Freq: Every day | RECTAL | Status: DC | PRN

## 2021-02-20 MED ORDER — POLYETHYLENE GLYCOL 3350 17 G PO PACK
170.0000 g | PACK | Freq: Once | ORAL | Status: AC
  Administered 2021-02-20: 170 g via ORAL
  Filled 2021-02-20: qty 10

## 2021-02-20 NOTE — Progress Notes (Signed)
Physical Therapy Treatment Patient Details Name: Russell Terry MRN: [id number] DOB: 1941/06/12 Today's Date: 02/20/2021    History of Present Illness Patient is a  80 y.o. male with medical history significant for Lewy Body dementia, parkinsonian tremor, A. fib, COPD and HTN, BPH, renal cell CA and nephrolithiasis, with history of frequent UTIs  who has been on three different antibiotics over the past month who was brought in for evaluation of unresponsiveness. Found to have acute metabolic encephalopathy secondary to UTI    PT Comments    Pt received supine in bed, wife present in room. When asked if he would like to get out of bed, he replied "yes". He demo decreased command following and initiation compared to yesterday's session. He continues to require MAX A to complete bed mobility and total A+2  for repositioning. STS was attempted multiple times with increased time allowed for processing. Pt did attempt one stand with MAX A to lift, lean forward and steady RW - attempt was unsuccessful. Pt did appear content sitting on the EOB looking out the window. He performed anterior and lateral leans during this sitting time to adjust socks, bed sheets, blankets and reach for bed rail. Pt also participated in therex including LAQ and ankle pumps. Wife attempted to motivate patient participation throughout session. Would benefit from skilled PT to address above deficits and promote optimal return to PLOF.   Follow Up Recommendations  SNF     Equipment Recommendations  None recommended by PT    Recommendations for Other Services       Precautions / Restrictions Precautions Precautions: Fall Restrictions Weight Bearing Restrictions: No    Mobility  Bed Mobility Overal bed mobility: Needs Assistance Bed Mobility: Supine to Sit;Sit to Supine     Supine to sit: Max assist Sit to supine: Max assist   General bed mobility comments: assistance for BLE and trunk support. verbal cues for  task initiation and technique    Transfers Overall transfer level: Needs assistance Equipment used: Rolling walker (2 wheeled) Transfers: Sit to/from Stand Sit to Stand: Max assist         General transfer comment: x1 rep attempted - unable to achieve standing. MAX A for lift, forward lean and steadying RW. Decreased participation for following attempts due to limited command following and attention.  Ambulation/Gait             General Gait Details: deferred - pt did not stand in today's session   Stairs             Wheelchair Mobility    Modified Rankin (Stroke Patients Only)       Balance Overall balance assessment: Needs assistance Sitting-balance support: Feet supported Sitting balance-Leahy Scale: Fair Sitting balance - Comments: Improved from MOD A to SUP to sit EOB. He remained EOB for 15 minutes demo forward and lateral weight shifts and some LE therex.       Standing balance comment: did not stand in today's session                            Cognition Arousal/Alertness: Awake/alert Behavior During Therapy: Flat affect Overall Cognitive Status: History of cognitive impairments - at baseline                                 General Comments: limited ability to follow single step commands with  extra time and multi-modal cueing - decreased from yesterday's session      Exercises Other Exercises Other Exercises: bed mobility, sitting EOB for 15 minutes w/ no UE support, forward lean to adjust socks w/ SUP, lateral leans to reach for blankets and bed railing w/ SUP, LAQ x 20 reps with VC to "kick", ankle pumps x 20 reps    General Comments        Pertinent Vitals/Pain Pain Assessment: No/denies pain    Home Living                      Prior Function            PT Goals (current goals can now be found in the care plan section) Acute Rehab PT Goals Patient Stated Goal: patient unable to participate with  goal setting PT Goal Formulation: With family Time For Goal Achievement: 03/01/21 Potential to Achieve Goals: Fair    Frequency    Min 2X/week      PT Plan      Co-evaluation              AM-PAC PT "6 Clicks" Mobility   Outcome Measure  Help needed turning from your back to your side while in a flat bed without using bedrails?: A Lot Help needed moving from lying on your back to sitting on the side of a flat bed without using bedrails?: A Lot Help needed moving to and from a bed to a chair (including a wheelchair)?: A Lot Help needed standing up from a chair using your arms (e.g., wheelchair or bedside chair)?: A Lot Help needed to walk in hospital room?: Total Help needed climbing 3-5 steps with a railing? : Total 6 Click Score: 10    End of Session Equipment Utilized During Treatment: Gait belt Activity Tolerance:  (limited by cognition and command following) Patient left: in bed;with call bell/phone within reach;with bed alarm set;with family/visitor present   PT Visit Diagnosis: Unsteadiness on feet (R26.81);Muscle weakness (generalized) (M62.81)     Time: [id number] PT Time Calculation (min) (ACUTE ONLY): 23 min  Charges:  $Therapeutic Activity: 8-22 mins $Neuromuscular Re-education: 8-22 mins                    Russell Terry PT, DPT 02/20/21 11:48 AM [id number]    Russell Terry 02/20/2021, 11:43 AM

## 2021-02-20 NOTE — Discharge Summary (Addendum)
Physician Discharge Summary   Russell Terry  male DOB: 11/11/1940  [id number]  PCP: [place] Pink, MD  Admit date: 02/14/2021 Discharge date: 02/20/2021  Admitted From: home Disposition:  home with hospice.  Wife updated at bedside prior to discharge.  CODE STATUS: DNR  Discharge Instructions     Discharge instructions   Complete by: As directed    You are going home with hospice.  You do not have UTI, and is likely chronically colonized by bacteria in your urinary track, so no need to treat with more antibiotics.  I have reduced some of your medications that are sedating.    Encourage oral hydration, and add Miralax to drinks to avoid constipation.   Dr. Enzo Bi [facility] Course:  For full details, please see H&P, progress notes, consult notes and ancillary notes.  Briefly,  Russell Terry is a 80 y.o. male with medical history significant for Lewy Body dementia, parkinsonian tremor on Sinemet, A. fib, COPD and HTN, BPH, renal cell CA and nephrolithiasis, with history of frequent treatment of UTIs,  who had been on three different antibiotics over the past month who was brought in for evaluation of unresponsiveness.   Acute metabolic encephalopathy possible 2/2 dehydration and/or home sedating meds - Patient with several hours of decreased responsiveness though no acute distress - Head CT with no acute findings --noted to have reduced oral intake.  Pt more alert the next day after IVF and not having received home sedating meds.  AMS less likely due to UTI since pt was just recently properly treated with abx. --Pt takes many sedatives at home --home trazodone and Requip were d/c'ed. --cont seroquel at reduced 25 mg and switched to nightly --encourage oral hydration   UTI, ruled out --per wife, pt had been treated for UTI 3-4 times recently based on urine cx results.   --started on ceftriaxone --urine cx not obtained on admission, but  added-on later to original collection.  Grew multiples species, suggesting chronic colonization. --ceftriaxone d/c'ed. --Advised wife not to treat for UTI unless symptomatic.   Nephrolithiasis history of renal cell CA s/p nephrectomy   BPH --cont home Avodart as [name]   Lewy body dementia without behavioral disturbance ([name]) - on home Exelon, Seroquel, trazodone and Effexor and Aricept  --had rapid decline recently --home trazodone and Requip were d/c'ed due to unresponsiveness presumed due to over-sedation.   --cont seroquel at reduced 25 mg and switched to nightly --inpatient palliative consult provided.  Wife decided to take pt home with hospice.  Parkinson's disease - Continue Sinemet   Poor oral intake --SLP eval found no problem with swallowing.  Pt just refuses to take in orals sometimes. --palliative consulted.  Wife decided to take pt home with hospice.     Atrial fibrillation ([name]) - Not currently on anticoagulation --converted home Cardizem CD 240 mg daily to dilt 60 mg QID so pills can be crushed.  Discharged on dilt 90 mg TID.     COPD (chronic obstructive pulmonary disease) ([name]) - Not acutely exacerbated.  Albuterol as needed     Essential hypertension - BP up to 190's, pt had missed several days of BP meds due to refusal to swallow pills --BP still varied widely --converted home Cardizem CD 240 mg daily to dilt 60 mg QID so pills can be crushed.  Discharged on dilt 90 mg TID. --cont oral hydralazine 50 mg q8h (new)   Constipation --No  BM after 2 suppositories and Senna.  Pt advised to add Miralax to all pt's drinks.  CKD 3a   Discharge Diagnoses:  Principal Problem:   Acute metabolic encephalopathy Active Problems:   Alzheimer's dementia without behavioral disturbance ([name])   Atrial fibrillation ([name])   COPD (chronic obstructive pulmonary disease) ([name])   Essential hypertension   Nephrolithiasis   Recurrent UTI   History of renal cell  carcinoma   30 Day Unplanned Readmission Risk Score    Flowsheet Row ED to Hosp-Admission (Current) from 02/14/2021 in [name] (1C)  30 Day Unplanned Readmission Risk Score (%) 14.55 Filed at 02/20/2021 1200       This score is the patient's risk of an unplanned readmission within 30 days of being discharged (0 -100%). The score is based on dignosis, age, lab data, medications, orders, and past utilization.   Low:  0-14.9   Medium: 15-21.9   High: 22-29.9   Extreme: 30 and above         Discharge Instructions:  Allergies as of 02/20/2021       Reactions   Quinapril Cough   Ambien [zolpidem Tartrate] Other (See Comments)   Unspecified reaction   Fenofibrate Other (See Comments)   Unknown   Amoxicillin-pot Clavulanate Nausea And Vomiting   Morphine Nausea Only        Medication List     STOP taking these medications    acetaminophen 325 MG tablet Commonly known as: TYLENOL   albuterol 108 (90 Base) MCG/ACT inhaler Commonly known as: VENTOLIN HFA   aspirin EC 81 MG tablet   cetirizine 10 MG tablet Commonly known as: ZYRTEC   diltiazem 240 MG 24 hr capsule Commonly known as: CARDIZEM CD   levofloxacin 250 MG tablet Commonly known as: LEVAQUIN   melatonin 5 MG Tabs   rivastigmine 1.5 MG capsule Commonly known as: EXELON   rOPINIRole 0.5 MG tablet Commonly known as: REQUIP   traZODone 100 MG tablet Commonly known as: DESYREL       TAKE these medications    bisacodyl 5 MG EC tablet Generic drug: bisacodyl Take 5 mg by mouth daily as needed for moderate constipation.   carbidopa-levodopa 50-200 MG tablet Commonly known as: SINEMET CR Take 1 tablet by mouth 4 (four) times daily.   diltiazem 90 MG tablet Commonly known as: CARDIZEM Take 1 tablet (90 mg total) by mouth 3 (three) times daily. Blood pressure medication that you can crush.   donepezil 10 MG tablet Commonly known as: ARICEPT Take 10 mg by mouth at  bedtime.   dutasteride 0.5 MG capsule Commonly known as: AVODART Take 0.5 mg by mouth daily.   feeding supplement Liqd Take 237 mLs by mouth 3 (three) times daily between meals.   hydrALAZINE 50 MG tablet Commonly known as: APRESOLINE Take 1 tablet (50 mg total) by mouth 3 (three) times daily. Blood pressure medication.   polyethylene glycol 17 g packet Commonly known as: MIRALAX / GLYCOLAX Take 17 g by mouth daily. What changed:  when to take this reasons to take this   QUEtiapine 25 MG tablet Commonly known as: SEROQUEL Take 1 tablet (25 mg total) by mouth at bedtime. Reduced from 50 mg, and changed from daily. What changed:  how much to take when to take this additional instructions   venlafaxine XR 75 MG 24 hr capsule Commonly known as: EFFEXOR-XR Take 75 mg by mouth at bedtime.         Contact  information for after-discharge care     [place] .   Service: Skilled Nursing Contact information: [place] [address] [phone]                     Allergies  Allergen Reactions   Quinapril Cough   Ambien [Zolpidem Tartrate] Other (See Comments)    Unspecified reaction   Fenofibrate Other (See Comments)    Unknown   Amoxicillin-Pot Clavulanate Nausea And Vomiting   Morphine Nausea Only     The results of significant diagnostics from this hospitalization (including imaging, microbiology, ancillary and laboratory) are listed below for reference.   Consultations:   Procedures/Studies: CT HEAD WO CONTRAST (5MM)  Result Date: 02/14/2021 CLINICAL DATA:  Dizziness EXAM: CT HEAD WITHOUT CONTRAST TECHNIQUE: Contiguous axial images were obtained from the base of the skull through the vertex without intravenous contrast. COMPARISON:  12/02/2008 FINDINGS: Brain: There is no mass, hemorrhage or extra-axial collection. There is generalized atrophy without lobar  predilection. Hypodensity of the white matter is most commonly associated with chronic microvascular disease. Vascular: No abnormal hyperdensity of the major intracranial arteries or dural venous sinuses. No intracranial atherosclerosis. Skull: Old right frontal craniotomy. Sinuses/Orbits: No fluid levels or advanced mucosal thickening of the visualized paranasal sinuses. No mastoid or middle ear effusion. The orbits are normal. IMPRESSION: Chronic microvascular ischemia and generalized atrophy without acute intracranial abnormality. Electronically Signed   By: Ulyses Jarred M.D.   On: 02/14/2021 21:52   DG Chest Portable 1 View  Result Date: 02/14/2021 CLINICAL DATA:  Syncopal episode EXAM: PORTABLE CHEST 1 VIEW COMPARISON:  01/05/2019 FINDINGS: The heart size and mediastinal contours are within normal limits. Aortic atherosclerosis. Both lungs are clear. The visualized skeletal structures are unremarkable. IMPRESSION: No active disease. Electronically Signed   By: Donavan Foil M.D.   On: 02/14/2021 22:02      Labs: BNP (last 3 results) Recent Labs    02/14/21 1913  BNP [id number]*   Basic Metabolic Panel: Recent Labs  Lab 02/14/21 1913 02/15/21 0029 02/16/21 0450 02/17/21 0455 02/18/21 0533 02/19/21 0440  NA 135  --  134* 135 133* 138  K 4.4  --  3.4* 3.5 3.6 3.7  CL 102  --  103 108 109 106  CO2 26  --  '22 22 22 23  '$ GLUCOSE 104*  --  152* 103* 90 93  BUN 22  --  '18 22 21 20  '$ CREATININE 1.62* 1.57* 1.30* 1.57* 1.26* 1.32*  CALCIUM 9.0  --  8.8* 8.8* 8.4* 9.0  MG  --  1.9 1.9 1.8 2.3 2.1  PHOS  --  2.5  --   --   --   --    Liver Function Tests: Recent Labs  Lab 02/14/21 1913  AST 20  ALT 11  ALKPHOS 59  BILITOT 1.3*  PROT 7.1  ALBUMIN 4.3   No results for input(s): LIPASE, AMYLASE in the last 168 hours. No results for input(s): AMMONIA in the last 168 hours. CBC: Recent Labs  Lab 02/14/21 1913 02/15/21 0029 02/16/21 0450 02/17/21 0455 02/18/21 0533 02/19/21 0440   WBC 6.1 5.5 7.3 10.6* 6.2 5.5  NEUTROABS 3.5  --   --   --   --   --   HGB 15.4 13.8 14.9 13.9 13.3 13.5  HCT 44.5 39.7 40.9 38.9* 36.7* 37.8*  MCV 94.1 92.5 92.3 91.3 93.4 93.6  PLT 191  189 182 200 192 204   Cardiac Enzymes: No results for input(s): CKTOTAL, CKMB, CKMBINDEX, TROPONINI in the last 168 hours. BNP: Invalid input(s): POCBNP CBG: No results for input(s): GLUCAP in the last 168 hours. D-Dimer No results for input(s): DDIMER in the last 72 hours. Hgb A1c No results for input(s): HGBA1C in the last 72 hours. Lipid Profile No results for input(s): CHOL, HDL, LDLCALC, TRIG, CHOLHDL, LDLDIRECT in the last 72 hours. Thyroid function studies No results for input(s): TSH, T4TOTAL, T3FREE, THYROIDAB in the last 72 hours.  Invalid input(s): FREET3 Anemia work up No results for input(s): VITAMINB12, FOLATE, FERRITIN, TIBC, IRON, RETICCTPCT in the last 72 hours. Urinalysis    Component Value Date/Time   COLORURINE YELLOW (A) 02/14/2021 2202   APPEARANCEUR HAZY (A) 02/14/2021 2202   APPEARANCEUR Cloudy (A) 08/08/2020 0935   LABSPEC 1.023 02/14/2021 2202   LABSPEC 1.016 01/01/2013 1221   PHURINE 5.0 02/14/2021 2202   GLUCOSEU NEGATIVE 02/14/2021 2202   GLUCOSEU Negative 01/01/2013 1221   HGBUR NEGATIVE 02/14/2021 2202   BILIRUBINUR NEGATIVE 02/14/2021 2202   BILIRUBINUR Negative 08/08/2020 0935   BILIRUBINUR Negative 01/01/2013 1221   KETONESUR 5 (A) 02/14/2021 2202   PROTEINUR 30 (A) 02/14/2021 2202   NITRITE NEGATIVE 02/14/2021 2202   LEUKOCYTESUR LARGE (A) 02/14/2021 2202   LEUKOCYTESUR Negative 01/01/2013 1221   Sepsis Labs Invalid input(s): PROCALCITONIN,  WBC,  LACTICIDVEN Microbiology Recent Results (from the past 240 hour(s))  Urine Culture     Status: Abnormal   Collection Time: 02/14/21 10:12 PM   Specimen: Urine, Random  Result Value Ref Range Status   Specimen Description   Final    URINE, RANDOM Performed at [facility], [address] [place], [place] [address]    Special Requests   Final    NONE Performed at [facility], [address] [place], [place] [address]    Culture MULTIPLE SPECIES PRESENT, SUGGEST RECOLLECTION (A)  Final   Report Status 02/16/2021 FINAL  Final  Resp Panel by RT-PCR (Flu A&B, Covid)     Status: None   Collection Time: 02/15/21 12:29 AM  Result Value Ref Range Status   SARS Coronavirus 2 by RT PCR NEGATIVE NEGATIVE Final    Comment: (NOTE) SARS-CoV-2 target nucleic acids are NOT DETECTED.  The SARS-CoV-2 RNA is generally detectable in upper respiratory specimens during the acute phase of infection. The lowest concentration of SARS-CoV-2 viral copies this assay can detect is 138 copies/mL. A negative result does not preclude SARS-Cov-2 infection and should not be used as the sole basis for treatment or other patient management decisions. A negative result may occur with  improper specimen collection/handling, submission of specimen other than nasopharyngeal swab, presence of viral mutation(s) within the areas targeted by this assay, and inadequate number of viral copies(<138 copies/mL). A negative result must be combined with clinical observations, patient history, and epidemiological information. The expected result is Negative.  Fact Sheet for Patients:  EntrepreneurPulse.com.au  Fact Sheet for Healthcare Providers:  IncredibleEmployment.be  This test is no t yet approved or cleared by the [place] FDA and  has been authorized for detection and/or diagnosis of SARS-CoV-2 by FDA under an Emergency Use Authorization (EUA). This EUA will remain  in effect (meaning this test can be used) for the duration of the COVID-19 declaration under Section 564(b)(1) of the Act, 21 U.S.C.section 360bbb-3(b)(1), unless the authorization is terminated  or revoked sooner.       Influenza A by  PCR NEGATIVE NEGATIVE Final   Influenza B by PCR  NEGATIVE NEGATIVE Final    Comment: (NOTE) The Xpert Xpress SARS-CoV-2/FLU/RSV plus assay is intended as an aid in the diagnosis of influenza from Nasopharyngeal swab specimens and should not be used as a sole basis for treatment. Nasal washings and aspirates are unacceptable for Xpert Xpress SARS-CoV-2/FLU/RSV testing.  Fact Sheet for Patients: EntrepreneurPulse.com.au  Fact Sheet for Healthcare Providers: IncredibleEmployment.be  This test is not yet approved or cleared by the [place] FDA and has been authorized for detection and/or diagnosis of SARS-CoV-2 by FDA under an Emergency Use Authorization (EUA). This EUA will remain in effect (meaning this test can be used) for the duration of the COVID-19 declaration under Section 564(b)(1) of the Act, 21 U.S.C. section 360bbb-3(b)(1), unless the authorization is terminated or revoked.  Performed at [facility], [address] [place], [place] [address]      Total time spend on discharging this patient, including the last patient exam, discussing the hospital stay, instructions for ongoing care as it relates to all pertinent caregivers, as well as preparing the medical discharge records, prescriptions, and/or referrals as applicable, is 60 minutes.    Enzo Bi, MD  [facility] 02/20/2021, 12:49 PM

## 2021-02-28 ENCOUNTER — Ambulatory Visit: Payer: Self-pay | Admitting: Urology

## 2021-03-05 DIAGNOSIS — I4891 Unspecified atrial fibrillation: Secondary | ICD-10-CM | POA: Diagnosis not present

## 2021-03-06 ENCOUNTER — Other Ambulatory Visit: Payer: PPO | Admitting: Primary Care

## 2021-03-15 DEATH — deceased
